# Patient Record
Sex: Female | Born: 1935 | Race: White | Hispanic: No | Marital: Married | Smoking: Never smoker
Health system: Southern US, Community
[De-identification: ages and names within clinical notes are randomized; demographics above are authoritative.]

## PROBLEM LIST (undated history)

## (undated) DIAGNOSIS — K589 Irritable bowel syndrome without diarrhea: Secondary | ICD-10-CM

## (undated) DIAGNOSIS — IMO0002 Reserved for concepts with insufficient information to code with codable children: Secondary | ICD-10-CM

## (undated) DIAGNOSIS — I351 Nonrheumatic aortic (valve) insufficiency: Secondary | ICD-10-CM

## (undated) DIAGNOSIS — E785 Hyperlipidemia, unspecified: Secondary | ICD-10-CM

## (undated) DIAGNOSIS — I7300 Raynaud's syndrome without gangrene: Secondary | ICD-10-CM

## (undated) DIAGNOSIS — J810 Acute pulmonary edema: Secondary | ICD-10-CM

## (undated) DIAGNOSIS — I2510 Atherosclerotic heart disease of native coronary artery without angina pectoris: Secondary | ICD-10-CM

## (undated) DIAGNOSIS — I10 Essential (primary) hypertension: Secondary | ICD-10-CM

## (undated) DIAGNOSIS — I639 Cerebral infarction, unspecified: Secondary | ICD-10-CM

## (undated) DIAGNOSIS — M1990 Unspecified osteoarthritis, unspecified site: Secondary | ICD-10-CM

## (undated) DIAGNOSIS — I6529 Occlusion and stenosis of unspecified carotid artery: Secondary | ICD-10-CM

## (undated) DIAGNOSIS — K279 Peptic ulcer, site unspecified, unspecified as acute or chronic, without hemorrhage or perforation: Secondary | ICD-10-CM

## (undated) DIAGNOSIS — F0390 Unspecified dementia without behavioral disturbance: Secondary | ICD-10-CM

## (undated) DIAGNOSIS — I4891 Unspecified atrial fibrillation: Secondary | ICD-10-CM

## (undated) HISTORY — DX: Occlusion and stenosis of unspecified carotid artery: I65.29

## (undated) HISTORY — DX: Hyperlipidemia, unspecified: E78.5

## (undated) HISTORY — DX: Unspecified atrial fibrillation: I48.91

## (undated) HISTORY — DX: Essential (primary) hypertension: I10

## (undated) HISTORY — DX: Unspecified osteoarthritis, unspecified site: M19.90

## (undated) HISTORY — DX: Atherosclerotic heart disease of native coronary artery without angina pectoris: I25.10

## (undated) HISTORY — DX: Irritable bowel syndrome without diarrhea: K58.9

## (undated) HISTORY — PX: APPENDECTOMY: SHX54

## (undated) HISTORY — DX: Reserved for concepts with insufficient information to code with codable children: IMO0002

## (undated) HISTORY — DX: Acute pulmonary edema: J81.0

## (undated) HISTORY — DX: Unspecified dementia, unspecified severity, without behavioral disturbance, psychotic disturbance, mood disturbance, and anxiety: F03.90

## (undated) HISTORY — DX: Raynaud's syndrome without gangrene: I73.00

## (undated) HISTORY — DX: Nonrheumatic aortic (valve) insufficiency: I35.1

## (undated) HISTORY — DX: Cerebral infarction, unspecified: I63.9

## (undated) HISTORY — DX: Peptic ulcer, site unspecified, unspecified as acute or chronic, without hemorrhage or perforation: K27.9

---

## 1998-12-14 ENCOUNTER — Encounter: Payer: Self-pay | Admitting: Cardiology

## 1998-12-14 ENCOUNTER — Encounter: Admission: RE | Admit: 1998-12-14 | Discharge: 1998-12-14 | Payer: Self-pay | Admitting: Cardiology

## 1999-06-14 ENCOUNTER — Emergency Department (HOSPITAL_COMMUNITY): Admission: EM | Admit: 1999-06-14 | Discharge: 1999-06-14 | Payer: Self-pay

## 1999-06-15 ENCOUNTER — Emergency Department (HOSPITAL_COMMUNITY): Admission: EM | Admit: 1999-06-15 | Discharge: 1999-06-15 | Payer: Self-pay | Admitting: Emergency Medicine

## 1999-12-17 ENCOUNTER — Encounter: Payer: Self-pay | Admitting: Internal Medicine

## 1999-12-17 ENCOUNTER — Encounter: Admission: RE | Admit: 1999-12-17 | Discharge: 1999-12-17 | Payer: Self-pay | Admitting: Internal Medicine

## 2000-02-22 ENCOUNTER — Emergency Department (HOSPITAL_COMMUNITY): Admission: EM | Admit: 2000-02-22 | Discharge: 2000-02-22 | Payer: Self-pay | Admitting: Internal Medicine

## 2000-12-17 ENCOUNTER — Encounter: Payer: Self-pay | Admitting: Internal Medicine

## 2000-12-17 ENCOUNTER — Encounter: Admission: RE | Admit: 2000-12-17 | Discharge: 2000-12-17 | Payer: Self-pay | Admitting: Internal Medicine

## 2001-12-18 ENCOUNTER — Encounter: Payer: Self-pay | Admitting: Internal Medicine

## 2001-12-18 ENCOUNTER — Encounter: Admission: RE | Admit: 2001-12-18 | Discharge: 2001-12-18 | Payer: Self-pay | Admitting: Internal Medicine

## 2002-12-20 ENCOUNTER — Encounter: Admission: RE | Admit: 2002-12-20 | Discharge: 2002-12-20 | Payer: Self-pay | Admitting: Internal Medicine

## 2003-03-09 ENCOUNTER — Ambulatory Visit (HOSPITAL_COMMUNITY): Admission: RE | Admit: 2003-03-09 | Discharge: 2003-03-09 | Payer: Self-pay | Admitting: Internal Medicine

## 2003-12-28 ENCOUNTER — Encounter: Admission: RE | Admit: 2003-12-28 | Discharge: 2003-12-28 | Payer: Self-pay | Admitting: Internal Medicine

## 2005-03-12 ENCOUNTER — Encounter: Admission: RE | Admit: 2005-03-12 | Discharge: 2005-03-12 | Payer: Self-pay | Admitting: Internal Medicine

## 2006-03-14 ENCOUNTER — Encounter: Admission: RE | Admit: 2006-03-14 | Discharge: 2006-03-14 | Payer: Self-pay | Admitting: Internal Medicine

## 2007-03-16 ENCOUNTER — Encounter: Admission: RE | Admit: 2007-03-16 | Discharge: 2007-03-16 | Payer: Self-pay | Admitting: Internal Medicine

## 2007-05-12 ENCOUNTER — Ambulatory Visit: Payer: Self-pay | Admitting: Cardiology

## 2007-05-12 ENCOUNTER — Inpatient Hospital Stay (HOSPITAL_COMMUNITY): Admission: EM | Admit: 2007-05-12 | Discharge: 2007-05-16 | Payer: Self-pay | Admitting: Emergency Medicine

## 2007-05-15 ENCOUNTER — Encounter (INDEPENDENT_AMBULATORY_CARE_PROVIDER_SITE_OTHER): Payer: Self-pay | Admitting: Internal Medicine

## 2007-05-20 ENCOUNTER — Ambulatory Visit: Payer: Self-pay | Admitting: Internal Medicine

## 2007-06-10 ENCOUNTER — Ambulatory Visit: Payer: Self-pay | Admitting: Emergency Medicine

## 2007-06-10 DIAGNOSIS — R0602 Shortness of breath: Secondary | ICD-10-CM

## 2007-06-10 DIAGNOSIS — J309 Allergic rhinitis, unspecified: Secondary | ICD-10-CM | POA: Insufficient documentation

## 2007-06-10 DIAGNOSIS — I10 Essential (primary) hypertension: Secondary | ICD-10-CM | POA: Insufficient documentation

## 2007-06-10 DIAGNOSIS — J438 Other emphysema: Secondary | ICD-10-CM | POA: Insufficient documentation

## 2007-06-10 DIAGNOSIS — R05 Cough: Secondary | ICD-10-CM

## 2007-07-22 ENCOUNTER — Ambulatory Visit: Payer: Self-pay | Admitting: Emergency Medicine

## 2008-02-29 DIAGNOSIS — J810 Acute pulmonary edema: Secondary | ICD-10-CM

## 2008-02-29 DIAGNOSIS — I4891 Unspecified atrial fibrillation: Secondary | ICD-10-CM

## 2008-02-29 DIAGNOSIS — I351 Nonrheumatic aortic (valve) insufficiency: Secondary | ICD-10-CM

## 2008-02-29 HISTORY — DX: Acute pulmonary edema: J81.0

## 2008-02-29 HISTORY — DX: Nonrheumatic aortic (valve) insufficiency: I35.1

## 2008-02-29 HISTORY — DX: Unspecified atrial fibrillation: I48.91

## 2008-03-05 ENCOUNTER — Ambulatory Visit: Payer: Self-pay | Admitting: Pulmonary Disease

## 2008-03-05 ENCOUNTER — Inpatient Hospital Stay (HOSPITAL_COMMUNITY): Admission: EM | Admit: 2008-03-05 | Discharge: 2008-03-12 | Payer: Self-pay | Admitting: Emergency Medicine

## 2008-03-05 ENCOUNTER — Ambulatory Visit: Payer: Self-pay | Admitting: Cardiology

## 2008-03-06 ENCOUNTER — Encounter: Payer: Self-pay | Admitting: Cardiology

## 2008-03-06 HISTORY — PX: TRANSTHORACIC ECHOCARDIOGRAM: SHX275

## 2008-03-11 HISTORY — PX: CARDIAC CATHETERIZATION: SHX172

## 2008-04-01 ENCOUNTER — Encounter: Admission: RE | Admit: 2008-04-01 | Discharge: 2008-04-01 | Payer: Self-pay | Admitting: Internal Medicine

## 2008-10-04 ENCOUNTER — Encounter: Admission: RE | Admit: 2008-10-04 | Discharge: 2008-10-04 | Payer: Self-pay | Admitting: Cardiology

## 2008-10-07 ENCOUNTER — Ambulatory Visit (HOSPITAL_COMMUNITY): Admission: RE | Admit: 2008-10-07 | Discharge: 2008-10-07 | Payer: Self-pay | Admitting: Cardiology

## 2008-12-28 ENCOUNTER — Ambulatory Visit: Payer: Self-pay | Admitting: Emergency Medicine

## 2009-02-02 ENCOUNTER — Emergency Department (HOSPITAL_COMMUNITY): Admission: EM | Admit: 2009-02-02 | Discharge: 2009-02-03 | Payer: Self-pay | Admitting: Emergency Medicine

## 2009-04-03 ENCOUNTER — Encounter: Admission: RE | Admit: 2009-04-03 | Discharge: 2009-04-03 | Payer: Self-pay | Admitting: Internal Medicine

## 2009-09-25 ENCOUNTER — Ambulatory Visit: Payer: Self-pay | Admitting: Cardiology

## 2010-03-28 ENCOUNTER — Ambulatory Visit: Payer: Self-pay | Admitting: Cardiology

## 2010-04-02 ENCOUNTER — Ambulatory Visit (INDEPENDENT_AMBULATORY_CARE_PROVIDER_SITE_OTHER): Payer: Medicare Other | Admitting: Cardiology

## 2010-04-02 DIAGNOSIS — I359 Nonrheumatic aortic valve disorder, unspecified: Secondary | ICD-10-CM

## 2010-04-02 DIAGNOSIS — I4891 Unspecified atrial fibrillation: Secondary | ICD-10-CM

## 2010-04-03 ENCOUNTER — Ambulatory Visit: Payer: Self-pay | Admitting: Cardiology

## 2010-05-10 ENCOUNTER — Other Ambulatory Visit: Payer: Self-pay | Admitting: Internal Medicine

## 2010-05-10 DIAGNOSIS — Z1231 Encounter for screening mammogram for malignant neoplasm of breast: Secondary | ICD-10-CM

## 2010-05-15 ENCOUNTER — Other Ambulatory Visit: Payer: Self-pay | Admitting: Cardiology

## 2010-05-15 DIAGNOSIS — I4891 Unspecified atrial fibrillation: Secondary | ICD-10-CM

## 2010-05-15 LAB — BRAIN NATRIURETIC PEPTIDE
Pro B Natriuretic peptide (BNP): 1340 pg/mL — ABNORMAL HIGH (ref 0.0–100.0)
Pro B Natriuretic peptide (BNP): 1655 pg/mL — ABNORMAL HIGH (ref 0.0–100.0)
Pro B Natriuretic peptide (BNP): 1883 pg/mL — ABNORMAL HIGH (ref 0.0–100.0)
Pro B Natriuretic peptide (BNP): 2582 pg/mL — ABNORMAL HIGH (ref 0.0–100.0)

## 2010-05-15 LAB — GLUCOSE, CAPILLARY
Glucose-Capillary: 106 mg/dL — ABNORMAL HIGH (ref 70–99)
Glucose-Capillary: 128 mg/dL — ABNORMAL HIGH (ref 70–99)
Glucose-Capillary: 137 mg/dL — ABNORMAL HIGH (ref 70–99)
Glucose-Capillary: 160 mg/dL — ABNORMAL HIGH (ref 70–99)
Glucose-Capillary: 93 mg/dL (ref 70–99)
Glucose-Capillary: 98 mg/dL (ref 70–99)
Glucose-Capillary: 99 mg/dL (ref 70–99)

## 2010-05-15 LAB — CBC
HCT: 26.6 % — ABNORMAL LOW (ref 36.0–46.0)
HCT: 26.7 % — ABNORMAL LOW (ref 36.0–46.0)
HCT: 30.4 % — ABNORMAL LOW (ref 36.0–46.0)
HCT: 32.2 % — ABNORMAL LOW (ref 36.0–46.0)
HCT: 32.3 % — ABNORMAL LOW (ref 36.0–46.0)
Hemoglobin: 10.6 g/dL — ABNORMAL LOW (ref 12.0–15.0)
Hemoglobin: 11.3 g/dL — ABNORMAL LOW (ref 12.0–15.0)
Hemoglobin: 11.7 g/dL — ABNORMAL LOW (ref 12.0–15.0)
Hemoglobin: 9.3 g/dL — ABNORMAL LOW (ref 12.0–15.0)
MCHC: 34.8 g/dL (ref 30.0–36.0)
MCHC: 34.9 g/dL (ref 30.0–36.0)
MCV: 86.9 fL (ref 78.0–100.0)
MCV: 87.2 fL (ref 78.0–100.0)
Platelets: 226 10*3/uL (ref 150–400)
Platelets: 276 10*3/uL (ref 150–400)
Platelets: 305 10*3/uL (ref 150–400)
RBC: 3.06 MIL/uL — ABNORMAL LOW (ref 3.87–5.11)
RBC: 3.51 MIL/uL — ABNORMAL LOW (ref 3.87–5.11)
RBC: 3.69 MIL/uL — ABNORMAL LOW (ref 3.87–5.11)
RDW: 13.6 % (ref 11.5–15.5)
RDW: 13.8 % (ref 11.5–15.5)
RDW: 13.9 % (ref 11.5–15.5)
RDW: 14 % (ref 11.5–15.5)
RDW: 14.4 % (ref 11.5–15.5)
WBC: 12.2 10*3/uL — ABNORMAL HIGH (ref 4.0–10.5)
WBC: 12.8 10*3/uL — ABNORMAL HIGH (ref 4.0–10.5)

## 2010-05-15 LAB — APTT: aPTT: 24 seconds (ref 24–37)

## 2010-05-15 LAB — BLOOD GAS, ARTERIAL
Acid-base deficit: 2.3 mmol/L — ABNORMAL HIGH (ref 0.0–2.0)
Bicarbonate: 22.5 mEq/L (ref 20.0–24.0)
Bicarbonate: 24.7 mEq/L — ABNORMAL HIGH (ref 20.0–24.0)
FIO2: 0.65 %
MECHVT: 450 mL
MECHVT: 500 mL
O2 Saturation: 96.6 %
O2 Saturation: 99.4 %
PEEP: 5 cmH2O
PEEP: 8 cmH2O
Patient temperature: 98.6
Patient temperature: 98.6
RATE: 15 resp/min
TCO2: 23.7 mmol/L (ref 0–100)
TCO2: 25.8 mmol/L (ref 0–100)
pCO2 arterial: 42 mmHg (ref 35.0–45.0)
pH, Arterial: 7.347 — ABNORMAL LOW (ref 7.350–7.400)
pO2, Arterial: 156 mmHg — ABNORMAL HIGH (ref 80.0–100.0)

## 2010-05-15 LAB — BASIC METABOLIC PANEL
BUN: 17 mg/dL (ref 6–23)
BUN: 8 mg/dL (ref 6–23)
BUN: 9 mg/dL (ref 6–23)
CO2: 27 mEq/L (ref 19–32)
CO2: 30 mEq/L (ref 19–32)
CO2: 31 mEq/L (ref 19–32)
Chloride: 108 mEq/L (ref 96–112)
Chloride: 93 mEq/L — ABNORMAL LOW (ref 96–112)
Chloride: 98 mEq/L (ref 96–112)
Chloride: 99 mEq/L (ref 96–112)
Creatinine, Ser: 0.49 mg/dL (ref 0.4–1.2)
GFR calc Af Amer: >60 mL/min (ref >60)
GFR calc Af Amer: >60 mL/min (ref >60)
GFR calc Af Amer: >60 mL/min (ref >60)
GFR calc Af Amer: >60 mL/min (ref >60)
GFR calc non Af Amer: >60 mL/min (ref >60)
GFR calc non Af Amer: >60 mL/min (ref >60)
GFR calc non Af Amer: >60 mL/min (ref >60)
Glucose, Bld: 102 mg/dL — ABNORMAL HIGH (ref 70–99)
Glucose, Bld: 103 mg/dL — ABNORMAL HIGH (ref 70–99)
Glucose, Bld: 108 mg/dL — ABNORMAL HIGH (ref 70–99)
Glucose, Bld: 115 mg/dL — ABNORMAL HIGH (ref 70–99)
Glucose, Bld: 92 mg/dL (ref 70–99)
Potassium: 3.1 mEq/L — ABNORMAL LOW (ref 3.5–5.1)
Potassium: 3.3 mEq/L — ABNORMAL LOW (ref 3.5–5.1)
Potassium: 3.4 mEq/L — ABNORMAL LOW (ref 3.5–5.1)
Potassium: 3.5 mEq/L (ref 3.5–5.1)
Potassium: 4.6 mEq/L (ref 3.5–5.1)
Potassium: 5.4 mEq/L — ABNORMAL HIGH (ref 3.5–5.1)
Sodium: 134 mEq/L — ABNORMAL LOW (ref 135–145)
Sodium: 135 mEq/L (ref 135–145)
Sodium: 137 mEq/L (ref 135–145)
Sodium: 137 mEq/L (ref 135–145)
Sodium: 138 mEq/L (ref 135–145)

## 2010-05-15 LAB — URINE CULTURE
Colony Count: 7000
Special Requests: NEGATIVE

## 2010-05-15 LAB — COMPREHENSIVE METABOLIC PANEL
ALT: 33 U/L (ref 0–35)
ALT: 60 U/L — ABNORMAL HIGH (ref 0–35)
AST: 86 U/L — ABNORMAL HIGH (ref 0–37)
Albumin: 2.9 g/dL — ABNORMAL LOW (ref 3.5–5.2)
Albumin: 3.3 g/dL — ABNORMAL LOW (ref 3.5–5.2)
Alkaline Phosphatase: 75 U/L (ref 39–117)
Alkaline Phosphatase: 87 U/L (ref 39–117)
CO2: 16 mEq/L — ABNORMAL LOW (ref 19–32)
Calcium: 8.3 mg/dL — ABNORMAL LOW (ref 8.4–10.5)
Calcium: 8.5 mg/dL (ref 8.4–10.5)
GFR calc Af Amer: >60 mL/min (ref >60)
Glucose, Bld: 112 mg/dL — ABNORMAL HIGH (ref 70–99)
Potassium: 3.7 mEq/L (ref 3.5–5.1)
Sodium: 143 mEq/L (ref 135–145)
Total Protein: 5.7 g/dL — ABNORMAL LOW (ref 6.0–8.3)
Total Protein: 6.4 g/dL (ref 6.0–8.3)

## 2010-05-15 LAB — URINALYSIS, ROUTINE W REFLEX MICROSCOPIC
Ketones, ur: NEGATIVE mg/dL
Nitrite: NEGATIVE
Protein, ur: NEGATIVE mg/dL
Urobilinogen, UA: 0.2 mg/dL (ref 0.0–1.0)

## 2010-05-15 LAB — POCT I-STAT 3, ART BLOOD GAS (G3+)
Bicarbonate: 16.1 mEq/L — ABNORMAL LOW (ref 20.0–24.0)
TCO2: 18 mmol/L (ref 0–100)
pCO2 arterial: 48.8 mmHg — ABNORMAL HIGH (ref 35.0–45.0)
pH, Arterial: 7.123 — CL (ref 7.350–7.400)
pO2, Arterial: 89 mmHg (ref 80.0–100.0)

## 2010-05-15 LAB — CK TOTAL AND CKMB (NOT AT ARMC)
CK, MB: 2.1 ng/mL (ref 0.3–4.0)
Relative Index: INVALID (ref 0.0–2.5)

## 2010-05-15 LAB — CULTURE, BLOOD (ROUTINE X 2)

## 2010-05-15 LAB — CULTURE, RESPIRATORY W GRAM STAIN

## 2010-05-15 LAB — CARDIAC PANEL(CRET KIN+CKTOT+MB+TROPI)
CK, MB: 5.1 ng/mL — ABNORMAL HIGH (ref 0.3–4.0)
CK, MB: 7.5 ng/mL — ABNORMAL HIGH (ref 0.3–4.0)
Relative Index: 4 — ABNORMAL HIGH (ref 0.0–2.5)
Total CK: 101 U/L (ref 7–177)
Total CK: 127 U/L (ref 7–177)
Troponin I: 0.38 ng/mL — ABNORMAL HIGH (ref 0.00–0.06)

## 2010-05-15 LAB — DIFFERENTIAL
Basophils Relative: 0 % (ref 0–1)
Eosinophils Absolute: 0.1 10*3/uL (ref 0.0–0.7)
Eosinophils Relative: 1 % (ref 0–5)
Lymphs Abs: 7.4 10*3/uL — ABNORMAL HIGH (ref 0.7–4.0)
Monocytes Absolute: 0.4 10*3/uL (ref 0.1–1.0)
Neutro Abs: 5.3 10*3/uL (ref 1.7–7.7)

## 2010-05-15 LAB — POCT CARDIAC MARKERS
Myoglobin, poc: 196 ng/mL (ref 12–200)
Troponin i, poc: <0.05 ng/mL (ref 0.00–0.09)

## 2010-05-15 LAB — URINE MICROSCOPIC-ADD ON

## 2010-05-15 LAB — TSH: TSH: 5.224 u[IU]/mL — ABNORMAL HIGH (ref 0.350–4.500)

## 2010-05-15 MED ORDER — AMIODARONE HCL 200 MG PO TABS
200.0000 mg | ORAL_TABLET | Freq: Every day | ORAL | Status: DC
Start: 2010-05-15 — End: 2010-05-17

## 2010-05-15 NOTE — Telephone Encounter (Signed)
Pt notified that prescription was refilled for amiodarone.

## 2010-05-15 NOTE — Telephone Encounter (Signed)
PT OUT OF AMIODARONE-USES [facility] [place] [phone]

## 2010-05-17 ENCOUNTER — Other Ambulatory Visit: Payer: Self-pay | Admitting: Cardiology

## 2010-05-17 DIAGNOSIS — I4891 Unspecified atrial fibrillation: Secondary | ICD-10-CM

## 2010-05-18 ENCOUNTER — Other Ambulatory Visit: Payer: Self-pay | Admitting: Cardiology

## 2010-05-18 DIAGNOSIS — I4891 Unspecified atrial fibrillation: Secondary | ICD-10-CM

## 2010-05-18 NOTE — Telephone Encounter (Signed)
escribe medication per fax request  

## 2010-05-19 ENCOUNTER — Other Ambulatory Visit: Payer: Self-pay | Admitting: Cardiology

## 2010-05-21 ENCOUNTER — Ambulatory Visit
Admission: RE | Admit: 2010-05-21 | Discharge: 2010-05-21 | Disposition: A | Discharge status: Home / Self Care | Payer: Medicare Other | Source: Ambulatory Visit | Attending: Internal Medicine | Admitting: Internal Medicine

## 2010-05-21 DIAGNOSIS — Z1231 Encounter for screening mammogram for malignant neoplasm of breast: Secondary | ICD-10-CM

## 2010-05-21 NOTE — Telephone Encounter (Signed)
duplicate

## 2010-05-22 ENCOUNTER — Other Ambulatory Visit: Payer: Self-pay | Admitting: Internal Medicine

## 2010-05-22 DIAGNOSIS — R928 Other abnormal and inconclusive findings on diagnostic imaging of breast: Secondary | ICD-10-CM

## 2010-05-24 ENCOUNTER — Ambulatory Visit
Admission: RE | Admit: 2010-05-24 | Discharge: 2010-05-24 | Disposition: A | Discharge status: Home / Self Care | Payer: Medicare Other | Source: Ambulatory Visit | Attending: Internal Medicine | Admitting: Internal Medicine

## 2010-05-24 DIAGNOSIS — R928 Other abnormal and inconclusive findings on diagnostic imaging of breast: Secondary | ICD-10-CM

## 2010-06-12 NOTE — Consult Note (Signed)
[name], [name] NO.:  [id number]   MEDICAL RECORD NO.:  [id number]          PATIENT TYPE:  INP   LOCATION:  3743                         FACILITY:  [facility]   PHYSICIAN:  Peter M. Swaziland, M.D.  DATE OF BIRTH:  Jan 12, 1921   DATE OF CONSULTATION:  05/14/2007  DATE OF DISCHARGE:                                 CONSULTATION   HISTORY OF PRESENT ILLNESS:  Ms. Huisman is a very pleasant 77 year old  white female who is admitted on May 12, 2007 with symptoms of nausea  and vomiting that were persistent.  She has a history of early dementia,  COPD and osteoarthritis.  She has been on chronic therapy with Aleve.  She does have a history of hyperlipidemia and family reports her blood  pressure has been recently elevated.  She currently is on no  antihypertensive therapy.  The patient was subsequently evaluated by Dr.  Lina Sar.  She underwent upper endoscopy today which did show  significant gastritis consistent with her nonsteroidal anti-inflammatory  drugs.  Post procedure she developed a narrow complex tachycardia with  rates up to 170. Review of the strips were consistent with atrial  flutter. These episodes were nonsustained and the patient denies any  significant symptoms.  She does note that she has had occasional  sensation of fluttering in her chest in the past, but has had no  sustained episodes. She otherwise denies any significant history of  cardiac disease with the exception that she has had a cardiac murmur for  the past 56 years.   PAST MEDICAL HISTORY:  1. Is significant for history of peptic ulcer disease and duodenal      ulcer.  2. History of arthritis.  3. History of previous appendectomy.  4. The patient is postmenopausal.  5. She has a history of lactose intolerance.  6. She has a history of hyperlipidemia.  7. She has early dementia.  8. History of depression, anxiety.  9. History of osteoporosis.  10.Previous compression fractures at  T9.  11.She had vitamin D deficiency.  12.Prior history of irritable bowel syndrome.   ALLERGIES:  SHE IS INTOLERANT TO ASPIRIN WHICH CAUSES STOMACH PAIN.  SHE  IS ALSO INTOLERANT TO ZOMETA.   MEDICATIONS ON ADMISSION:  1. Include Crestor 10 mg per day.  2. Vitamin E 400 units daily.  3. Aleve 220 mg 1-2 tablets daily.  4. Unisom p.r.n.  5. Exelon patch 9.5 mg daily.  6. Lexapro 5 mg daily.  7. Forteo 20 mcg shot once a day.   SOCIAL HISTORY:  Patient is married.  She has one daughter.  She is  retired, previously working for [employer].  She denies  history of tobacco or alcohol use.   FAMILY HISTORY:  Father died of myocardial infarction age 61.  Mother  died at age 39 with uterine cancer and a stroke.  Her  daughter is  healthy.  Her review of systems is otherwise unremarkable.   PHYSICAL EXAM:  The patient is an elderly white female in no apparent  distress. Her blood  pressure during this hospitalization have ranged  anywhere from 155-176 systolic with diastolics of 68-82.  Blood pressure  this evening was 190/110.  She currently is in sinus rhythm with  frequent PACs. Respirations are normal.  Oxygen saturations are 92% on  room air.  Her HEENT exam is unremarkable.  Oropharynx is clear.  NECK: Neck is supple without JVD, adenopathy or bruits.  LUNGS:  Were clear to auscultation percussion.  CARDIAC:  Exam reveals a regular rate and rhythm with a very soft 1/6  systolic murmur left sternal border.  There is no S3 year rub.  ABDOMEN:  Soft, nontender without hepatosplenomegaly, masses or bruits.  EXTREMITIES:  Without edema.  She has no focal neurologic symptoms.   LABORATORY DATA:  Iron level is 34 with TIBC of 248 and percent  saturation 14%; folate level was normal.  Ferritin is 64.  B12 is 499,  sodium 142, potassium 3.5, chloride 105, CO2 29, glucose of 95, BUN 7,  creatinine 0.58.  Liver function studies were normal. White count  11,600, hemoglobin 11.2,  hematocrit 32.4, platelets 280,000.  Urine  culture was insignificant growth. Magnesium is 2.1.   IMPRESSION:  1. Nonsustained episodes of supraventricular tachycardia most      consistent with atrial flutter.  The patient is mildly symptomatic.  2. Hypertension.  3. Gastritis secondary nonsteroidal anti-inflammatory drugs.  4. Anemia with heme-positive stools.  5. Chronic obstructive pulmonary disease.  6. History of compression fractures.  7. Early dementia.   PLAN:  Will complete cardiac workup by checking an echocardiogram and  will also obtain a TSH.  Will start her on Cardizem for blood pressure  and rhythm control.  I would avoid beta blockers due to her chronic  obstructive pulmonary disease and she is currently not a good candidate  for anticoagulation given her extensive gastric irritation in anemia.  We will follow up with you.           ______________________________  Peter M. [place], M.D.     PMJ/MEDQ  D:  05/14/2007  T:  05/14/2007  Job:  [id number]   cc:   Jeannett Senior A. Evlyn Kanner, M.D.  Redge Gainer. Perini, M.D.  Hedwig Morton. Juanda Chance, MD

## 2010-06-12 NOTE — Cardiovascular Report (Signed)
NAMEMARYKATHRYN, CARBONI NO.:  [id number]   MEDICAL RECORD NO.:  [id number]          PATIENT TYPE:  INP   LOCATION:  2009                         FACILITY:  [facility]   PHYSICIAN:  Colleen Can. Deborah Chalk, M.D.DATE OF BIRTH:  07-May-1920   DATE OF PROCEDURE:  03/11/2008  DATE OF DISCHARGE:                            CARDIAC CATHETERIZATION   PROCEDURE:  Left heart catheterization with selective coronary  angiography and left ventricular angiography.   TYPE AND SITE OF ENTRY:  Percutaneous right femoral artery with  AngioSeal.   CATHETERS:  6-French 4-curved Judkins right and left coronary catheter,  6-French pigtail ventriculographic catheter.   CONTRAST MATERIAL:  Omnipaque.   MEDICATION GIVEN DURING THE PROCEDURE:  Intracoronary nitroglycerin, 100  mcg.   COMMENTS:  The patient tolerated the procedure well.   HEMODYNAMIC DATA:  Aortic pressure was 147/59, LV was 132/1-5.  There  was no aortic valve gradient noted on pullback.   ANGIOGRAPHIC DATA:  The left ventricular angiogram was performed in RAO  position.  Overall, cardiac size and silhouette were normal.  There was  mild global decrease in ejection fraction of approximately 45%.  There  was no mitral regurgitation noted.  The left ventricular filling  pressures were only 4.  1. Right coronary artery was nondominant.  There was a 60% ostial      stenosis that did not appear to leave with attempted infusion of      intercuspal nitroglycerin.  I think, this moderate stenosis is      probably real, but it is a small nondominant vessel.  2. Left main coronary artery is normal.  3. Left anterior descending had a 30% proximal narrowing.  There was a      60-70% stenosis before the second diagonal end, 60% narrowing after      the diagonal.  This was in midportion of the vessel.  There were      irregularities proximally, but overall excellent flow.  4. Left circumflex was a large dominant vessel with a posterior    descending vessel and a large obtuse marginal to the posterolateral      wall and a more proximal marginal.  There were irregularities in      the left circumflex with no significant focal stenosis.   OVERALL IMPRESSION:  Moderate 2-vessel coronary atherosclerosis with a  60% ostial stenosis in the right coronary artery and a 60-70% stenosis  in the mid left anterior descending.   PLAN:  I think medical management is most appropriate with the course of  action.  We will try to control her arrhythmia with amiodarone.  We will  be giving IV fluids post cath and it may be that her BMP is in fact  artificially or chronically elevated with the low left ventricular  filling pressures noted.  It is felt that medical management is most  appropriate at this point in time.      Colleen Can. Deborah Chalk, M.D.  Electronically Signed     SNT/MEDQ  D:  03/11/2008  T:  03/11/2008  Job:  [id number]

## 2010-06-12 NOTE — H&P (Signed)
NAMEMARICRUZ, [name] NO.:  [id number]   MEDICAL RECORD NO.:  [id number]          PATIENT TYPE:  EMS   LOCATION:  MAJO                         FACILITY:  [facility]   PHYSICIAN:  Rollene Rotunda, MD, FACCDATE OF BIRTH:  09-08-1920   DATE OF ADMISSION:  03/05/2008  DATE OF DISCHARGE:                              HISTORY & PHYSICAL   PRIMARY CARE PHYSICIAN:  Mark A. Perini, M.D.   CARDIOLOGIST:  Colleen Can. Deborah Chalk, M.D.   REASON FOR PRESENTATION:  Evaluate patient with shortness of breath.   HISTORY OF PRESENT ILLNESS:  The patient is a 77 year old white female  with history of nonsustained supraventricular tachycardia that has not  been a significant problem in the past.  She has had an echocardiogram  in the past demonstrating moderate mitral regurgitation, a well-  preserved ejection fraction and some mild left ventricular hypertrophy.  She does have some COPD as well.  She was seen in the past by Dr.  Ronnald Nian group when she was hospitalized last April with nausea and  vomiting, felt to be medication related.  She had paroxysmal  supraventricular tachycardia that time.  Otherwise, she apparently does  pretty well.  Her family says that she does all the cooking.  She gets  around in her house.  She does not complain of palpitations.  She has  had no presyncope or syncope.  She has had no chest discomfort, neck or  arm discomfort.  She has had no recent shortness of breath.  She does  not have any PND or orthopnea.   Tonight she went to bed.  Her husband went into the bathroom and was  there for awhile.  When he came back, his wife had been awakened and was  struggling to breathe.  EMS was called and she was found to have 85%  saturation on 4 liters.  Her heart rate was 180.  She was transferred to  the emergency room.  She was treated with 6 of adenosine without change  in her rhythm.  Because she was combative and in respiratory distress,  she was sedated and  intubated.  She was also given 150 mg of amiodarone.  She subsequently had  conversion to sinus tachycardia.  She had  initially been labeled as a possible code  STEMI.  However, her EKG in sinus rhythm demonstrated some probable  anterior lead reversal with early transition in lead V2.  However, there  was no definitive ST-segment elevation.  She had some rate related mild  ST elevation upsloping that did not meet criteria for acute MI.  Again,  she had not been complaining of chest pain, though there might have been  mention of some arm discomfort that she told EMS on transport.  Her  chest x-ray is consistent with COPD and probable acute pulmonary edema.  There is diffuse airspace disease.  The patient had not been having any  fevers or chills.  She had not been having any weight gain.  She had not  been having any nausea, vomiting, diaphoresis.  She has not been  having  any edema.   PAST MEDICAL HISTORY:  1. COPD (treated by Dr. Ortencia Kick).  2. Osteoarthritis.  3. Remote peptic ulcer disease.  4. Gastroesophageal reflux disease.  5. Hyperlipidemia.  6. Depression/anxiety.  7. T9 compression fracture.  8. Allergic rhinitis.  9. Vitamin D deficiency.  10.Remote shingles.  11.Irritable bowel syndrome.  12.Hypertension.  13.Moderate mitral regurgitation.  14.Paroxysmal supraventricular tachycardia.   PAST SURGICAL HISTORY:  Appendectomy.   ALLERGIES:  1. ZOMETA.  2. ASPIRIN caused nausea.  3. MAXZIDE caused dizziness.   MEDICATIONS:  (The patient's family is not clear on this.  They did not  bring a list).  1. Crestor 20 mg daily.  2. Lexapro apparently 5 mg daily.  3. Ativan p.r.n.  4. Prilosec 20 mg daily.  5. Carafate 1 gm b.i.d.   SOCIAL HISTORY:  The patient is married.  She has one daughter.  She has  a granddaughter who works as a Engineer, civil (consulting) on the [employer].  She has never smoked  cigarettes.  She does not drink alcohol.   FAMILY HISTORY:  Noncontributory for early coronary  artery disease,  though her father died at 26 of myocardial infarction.   REVIEW OF SYSTEMS:  Unobtainable from the patient.  The family reports  bilateral shoulder pain related to arthritis, but no other apparent  review of systems was positive.   PHYSICAL EXAMINATION:  GENERAL:  The patient is intubated and sedated.  VITAL SIGNS:  Blood pressure is currently 98/70, heart rate 110 and  regular.  HEENT:  Eyes are unremarkable.  Pupils are equal, round and react to  light.  Fundi not visualized.  Oral mucosa, orally intubated  NECK:  Unable to assess jugular veins.  The patient is currently in  Trendelenburg, carotid upstroke brisk and symmetrical.  No bruits or  thyromegaly.  LYMPHATICS:  No cervical, axillary, inguinal adenopathy.  LUNGS:  Decreased breath sounds with diffuse crackles throughout, no  wheezing.  CHEST:  Unremarkable.  HEART:  PMI not displaced or sustained, S1-S2 within normal limits, no  S3-S4, no clicks, rubs, murmurs.  ABDOMEN:  Flat, positive bowel sounds, no hepatomegaly, no splenomegaly,  no midline pulsatile mass, no bruit, unable to assess rebound or  guarding.  SKIN:  No rashes, no nodules.  EXTREMITIES:  2+ pulses throughout, no edema, no cyanosis, no clubbing.  NEURO:  The patient does move all extremities when sedation wears off.  Unable to assess other.   DIAGNOSTICS:  1. EKG as described above.  2. Chest x-ray as above.   LABORATORY DATA:  Pending.   ASSESSMENT/PLAN:  1. Respiratory failure.  Patient is now intubated and sedated.  She      appears to have pulmonary edema on chest x-ray.  I think this is      related primarily to supraventricular tachycardia exacerbating      probably diastolic dysfunction and mitral regurgitation.  She does      not have diagnostic criteria for acute ST-segment elevation MI.      This was initially called a code STEMI, but this was stopped.  I      am going to have them recheck the leads and repeat the EKG.   We      will cycle cardiac enzymes.  For now, she is going to be treated      with Lasix and ventilator support.  I have contacted Critical Care      Medicine.  Because of her current blood pressure, I will  hold beta      blockers and nitrates.  She will be given aspirin.  I will start      heparin as she has no contraindication.  She will need another      echocardiogram during this admission.  We will check routine labs      including TSH.  2. Supraventricular tachycardia as above.  She was given a bolus of      amiodarone.  However, I will hold off on drip for now.  3. Chronic obstructive pulmonary disease per Critical Care Medicine.  4. Dyslipidemia.  We can continue the Crestor via tube.  5. Depression/anxiety.  We will continue the Lexapro once the family      brings the medication list so we know the doses.      Rollene Rotunda, MD, [facility]  Electronically Signed     JH/MEDQ  D:  03/05/2008  T:  03/05/2008  Job:  [id number]

## 2010-06-12 NOTE — H&P (Signed)
[name], [name] NO.:  [id number]   MEDICAL RECORD NO.:  [id number]          PATIENT TYPE:  INP   LOCATION:  5152                         FACILITY:  [facility]   PHYSICIAN:  [name], M.D.   DATE OF BIRTH:  [birth year]   DATE OF ADMISSION:  05/12/2007  DATE OF DISCHARGE:                              HISTORY & PHYSICAL   CHIEF COMPLAINT:  Nausea, vomiting.   HISTORY OF PRESENT ILLNESS:  [name] is a pleasant 77 year old female with  a past medical history as listed below.  She was seen in my office in  January 2009 at which time she was diagnosed with a early dementia.  She  was placed on an Exelon patch.  She initially had some slight nausea,  but this improved and she actually was able to titrate it to full-dose  and has been tolerating this for the last few months.  She was seen on  April 28, 2007, my office with a COPD exacerbation.  At that time, she  was having cough and some shortness of breath and she had significant  wheezing on exam and reduced peak flows.  She was treated with Advair  twice daily as well as p.r.n. albuterol and Singulair was added to her  regimen.  However, since that visit, she has had significant issues with  nausea and vomiting.  She states that more days and not she had  significant nausea and vomiting, sometimes when she stands up she feels  nauseated, at other times she may be just sitting and feel this way.  She has had significant vomiting.  She denies any significant diarrhea  or multiple stools, but she does have two loose stools each day, but  this is consistent with a past history of possible irritable bowel  syndrome.  She had no blood from above or below and she specifically  denies any abdominal pain.  She does feel hot at times, but there have  been no definite fevers and no vaginal or genitourinary symptoms.  She  has not been eating much solid foods the last several days and only been  eating broth-type foods and  liquid intake.  She has still been doing the  Exelon patch.  She is admitted for further evaluation of her nausea and  vomiting.   PAST MEDICAL HISTORY:  1. History of peptic ulcer disease with a duodenal ulcer remotely.  2. Nodal osteoarthritis.  3. Appendectomy 1955.  4. G1, P1 parity status with a normal vaginal delivery.  5. Postmenopausal since age 9, and she never took hormone therapy.  6. Gastroesophageal reflux disease.  7. Lactose intolerance.  8. Hyperlipidemia with a dyslipidemia pattern  9. Depression and anxiety in 2002 and another episode in April 2008.  10.Osteoporosis since 2002 with an anterior wedge compression fracture      at T9.  11.Lifetime pill dysphagia.  She crushes most pills.  12.Perineal allergic rhinitis.  13.Impaired fasting glucose.  14.Vitamin D deficiency diagnosed in April 2007.  15.Shingles on her left trunk in 1997.  16.Irritable bowel syndrome.  17.Hypertension  since 2008.  18.Decreased hearing.   ALLERGIES:  ASPIRIN CAUSES STOMACH PAIN, ZOMETA CAUSED CHILLS, FEVER AND  JOINT PAIN WHICH IS BASICALLY A REACTION TO HER INFUSION OF ZOMETA THAT  SHE RECEIVED IN 2004 FOR OSTEOPOROSIS. EVISTA CAUSED NAUSEA, VOMITING  AND A DRUNKEN DIZZY FEELING AND MAXZIDE CAUSED NAUSEA AND DIZZINESS.   MEDICATIONS:  1. Crestor 1/2 of a 20 mg pill daily.  2. Vitamin E 400 units daily.  3. Aleve 220 mg once daily.  4. Unisom as needed for sleep.  5. Exelon patch 9.5 mg changed daily, rare.  6. Ativan 0.5 mg.  7. Lexapro 1/2 of a 10 mg pill daily.  8. Extra strength Tylenol as needed.  9. Forteo 20 mcg shot once a day.   SOCIAL HISTORY:  She has  been married since 54.  Her husband's name  is [name].  She has one daughter, 2 grandchildren and four great-  grandchildren.  She is retired in 1988.  She worked for [employer] for over 30 years.  She has never smoked.  No alcohol or drug  use.   FAMILY HISTORY:  Father died at age 21 of a myocardial  infarction and  cardiomegaly.  Mother died at age 28 of uterine cancer and stroke.  There is diabetes in her family and one sister died of suicide.  Her  daughter, [name], is healthy.   REVIEW OF SYSTEMS:  As per the history present illness.  She denies any  chest pains or edema.   PHYSICAL EXAMINATION:  VITAL SIGNS:  Temperature 97.1, pulse 85,  respiratory 20, blood pressure 175/74, 95% saturation on room air,  weight is 56.24 kg, height is 65 inches.  GENERAL:     She is in no acute distress.  No definite pallor or  icterus.  There is no JVD.  NECK:  Supple.  LUNGS: Clear to auscultation bilaterally except for some slight  inspiratory rales at the left base.  ABDOMEN:  Notable for some hyperactive bowel sounds but is soft and  nontender with no masses or hepatosplenomegaly.  HEART:  Regular rate and rhythm with no significant murmur or gallop.  EXTREMITIES:  There is no edema.  She moves extremities x4.  NEUROLOGICAL:  She is alert and oriented x4 and neurologically grossly  intact.   LABORATORY DATA:  Phosphorus is 3.1, magnesium 2.1, lipase normal at 15,  total bilirubin is 0.4, direct bilirubin 0.2, alk phos 86, AST 19, ALT  13, total protein 6.9, albumin 3.4, ionized calcium 1.15, hemoglobin  11.9.  Sodium 138, potassium 3.7, chloride 101, glucose 165, BUN 14,  creatinine 0.8, white count 11.6, hemoglobin 12.0, platelet count  357,000.  There are 83% segs, 13% lymphocytes and 4% monocytes.  Acute  abdominal series shows hyperaerated lungs consistent with COPD.  There  is some pleural thickening of the left apex.  No evidence of bowel  obstruction or free air.  No definite acute process in the chest or  abdomen.  There is a probable Reidel's lobe of the liver extending into  the right pelvis.  She has osteopenic bones and some mild thoracolumbar  scoliosis.   ASSESSMENT/PLAN:  An 77 year old female with nausea and vomiting unclear  etiology.  It is possibly a medication side  effect.  Her Exelon patch  could be a culprit, less likely it is the Singulair or [employer]  which have been added recently in the last few weeks.  Also, this could  be due to  Forteo, although she had tolerated this for several months  prior to developing this nausea problem.  We will admit her.  We will  stop all of her potentially  offending medications.  We will check an abdominal ultrasound.  We will  obtain a GI consult.  She does desire a full code status.  We will place  her on DVT and ulcer prophylactic type treatments.  For her elevated  white blood count, we will follow a CBC and a urinalysis and urine  culture has been ordered.      [name], M.D.  Electronically Signed     MAP/MEDQ  D:  05/12/2007  T:  05/12/2007  Job:  [id number]

## 2010-06-15 NOTE — Discharge Summary (Signed)
[name], [name] NO.:  [id number]   MEDICAL RECORD NO.:  [id number]          PATIENT TYPE:  INP   LOCATION:  3743                         FACILITY:  [facility]   PHYSICIAN:  Mark A. Perini, M.D.   DATE OF BIRTH:  08/16/1920   DATE OF ADMISSION:  05/12/2007  DATE OF DISCHARGE:  05/16/2007                               DISCHARGE SUMMARY   DISCHARGE DIAGNOSES:  1. Nausea, vomiting, and diarrhea, probably due to Exelon treatment.  2. Moderate mitral regurgitation and mild-to-moderate aortic      insufficiency and mild pulmonary hypertension.  3. Gastritis, possibly secondary to NSAIDs and bile reflux.  4. Paroxysmal atrial flutter versus paroxysmal supraventricular      tachycardia.  5. Mild dementia.  6. Anemia.  7. Hypertension.  8. Chronic obstructive pulmonary disease.  9. Osteoarthritis.  10.Leukocytosis.   PROCEDURES:  Cardiology consultation and gastroenterology consultation.  Esophagogastroduodenoscopy showing acute gastritis, antral gastritis,  and bile reflux performed on May 14, 2007.   DISCHARGE MEDICATIONS:  1. Crestor 20 mg one-half pill daily.  2. Lexapro 10 mg one-half pill daily.  3. Ativan 0.5 mg each evening as needed.  4. Tylenol 650 mg every 6 hours as needed.  5. Prilosec 20 mg daily.  6. Vitamin E 400 units daily.  7. Unisom as needed.  8. Advair 250/50 one inhalation twice daily.  9. Diltiazem SR 180 mg daily.  10.Carafate 1 gram twice daily.  11.K tabs 8 mEq daily for 5 days.   HISTORY OF PRESENT ILLNESS:  [name] is a pleasant 77 year old female who  was diagnosed with early dementia in January 2009 and placed on Exelon  patch.  She initially had some slight nausea but this improved and she  was actually able to titrate to full-dose Exelon patch and tolerated  that for the last few months.  She was seen on April 28, 2007 with a  COPD exacerbation.  She was treated with Advair, albuterol, and  Singulair.  Since that visit, she  developed significant nausea and  vomiting.  She also has had 2 loose stools daily prior to admission.  She denied any bleeding.  Her oral intake had been very poor for a few  days prior to admission and she was admitted for further evaluation.   HOSPITAL COURSE:  [name] was admitted.  Gastroenterology was consulted.  It turns out that [name] was taking some NSAIDs and she actually had an  EGD which did show some gastritis.  Therefore, the NSAIDs were  discontinued and she was placed on proton pump inhibitor therapy.  After  her EGD, she had an episode of narrow complex tachycardia with rates up  to 170 beats per minute consistent with atrial flutter.  She was placed  on Cardizem and cardiology was consulted.  Echocardiogram was performed.  This showed a sclerotic mitral valve with moderate MR, mild-to-moderate  AI, aortic insufficiency, mild tricuspid regurgitation, mild pulmonary  hypertension, and left atrial enlargement.  Her blood pressure and her  rhythm stabilized and she was discharged home on oral Cardizem therapy.  By  May 16, 2007, she was feeling better.  She was not having any  further nausea or diarrhea or palpitations and was discharged home.   DISCHARGE PHYSICAL EXAMINATION:  VITAL SIGNS:  Blood pressure 133/61,  pulse 79, respiratory rate 16, temperature 97.9, 92% saturation on room  air.  GENERAL:  She was in no acute distress.  LUNGS:  Clear.  HEART:  Regular rate and rhythm.  ABDOMEN:  Soft and nontender with normal active bowel sounds.  NEUROLOGIC:  She was ambulating independently in the room and alert and  well oriented.   LABORATORY DATA:  The patient also had an abdominal ultrasound which  showed a few gallstones but no evidence of cholecystitis.  On April 18,  white count was 13.3, hemoglobin 12.2, platelet count 301,000.  There  were 69% segs, 21% lymphocytes, 8% monocytes.  Sodium was 140, potassium  4.2, chloride 103, CO2 30, glucose 99, BUN 7, creatinine  0.59.  Potassium was 3.0 on May 15, 2007.  Total protein 6.9, albumin 3.1,  AST 17, ALT 16, alk phos 78, total bili 0.5.  Cardiac enzymes on May 13, 2007 were negative.  Lipase on May 12, 2007 was normal at 15.  TSH  was normal at 1.52 on May 14, 2007.  Iron studies on May 14, 2007  showed iron of 34 which is low, a TIBC of 248, percent saturation of 14  which is low.  B12 was normal at 499.  Serum folate level was greater  than 20 ng/mL which is normal.  Urine culture was negative.  Helicobacter biopsies from the EGD were pending at this time.   DISCHARGE INSTRUCTIONS:  [name] is to follow a heart-healthy diet.  She is  to increase her activity slowly.  She is to call if she has any  recurrent problems.  She is to followup in 1 week with Dr. Waynard Edwards.  She  is to discontinue the Exelon patch and any NSAIDs.  She had also  previously been on Forteo  for severe osteoporosis and this is on hold  currently.      Mark A. Perini, M.D.  Electronically Signed     MAP/MEDQ  D:  06/25/2007  T:  06/25/2007  Job:  [id number]

## 2010-06-15 NOTE — Discharge Summary (Signed)
NAMEKISTA, ROBB NO.:  [id number]   MEDICAL RECORD NO.:  [id number]          PATIENT TYPE:  INP   LOCATION:  2009                         FACILITY:  [facility]   PHYSICIAN:  Colleen Can. Deborah Chalk, M.D.DATE OF BIRTH:  Sep 24, 1920   DATE OF ADMISSION:  03/05/2008  DATE OF DISCHARGE:  03/12/2008                               DISCHARGE SUMMARY   DISCHARGE DIAGNOSES:  1. Atrial fibrillation/flutter with rapid ventricular response, now      maintained in sinus rhythm on amiodarone.  2. Flash pulmonary edema requiring intubation and ventilatory support.  3. Anemia.  4. Aspiration pneumonia, treated with antibiotics.  5. Hypokalemia secondary to diuresis.  6. Left ventricular dysfunction, ejection fraction is estimated to be      45% of her cardiac catheterization on March 11, 2008.  7. Coronary artery disease status post cardiac catheterization,      March 11, 2008 showing nondominant right coronary with 60%      ostial stenosis, normal left main, 30% proximal left anterior      descending with 60-70% before and after the second diagonal, a      dominant left circumflex that has irregularities.  Overall, the      patient is felt to have moderate coronary artery disease and will      need to be controlled medically.   HISTORY OF PRESENT ILLNESS:  Galya is a very pleasant 77 year old white  female who has had a past history of nonsustained supraventricular  tachycardia that had not been a significant problem for her in the past.  She has had known moderate mitral regurgitation per past 2-D  echocardiogram with well preserved LV function.  She has had some left  ventricular hypertrophy.  She has basically been in her usual state of  health and has been very active.  She had had no complaints of chest  pain or shortness of breath or palpitations.  On the night of admission,  she had gone to bed and her husband went to the bathroom and was in the  bathroom for a period  of time and when he came back he noticed that his  wife had been awakened and was struggling to breathe.  EMS was called  and she had an O2 saturation that was 85%.  Her heart rate was 180.  She  was brought to the emergency room where she was treated with 6 mg of  adenosine without change in the rhythm.  She was subsequently combative  due to the respiratory distress and she was subsequently sedated and  intubated and placed on ventilatory support.  She was given 150 mg of  amiodarone and had conversion to sinus tachycardia.  She was  subsequently transferred to the Intensive Care Unit for further  management.   Please see the history and physical per Dr. Rollene Rotunda for further  patient presentation and profile.   LABORATORY DATA:  On admission, her white count was elevated at 22,000,  hematocrit was 33, and platelets 276.  Chemistry showed a BUN of 16,  creatinine of 0.9,  glucose was 112, and potassium was 3.7.  Blood gases  showed a pH 7.42, pCO2 38, pO2 81, and bicarb was 24.  Her second  troponin was elevated at 0.53.  Her chest x-ray showed cardiomegaly with  probable interstitial mild edema superimposed on COPD.  Her EKG was  concerning for probable anterior lead reversal with early transition in  lead V2.  There was no definitive ST-segment elevation.  She did have  some mild rate related mild ST elevation that was upsloping, that did  not meet the criteria for acute MI.   HOSPITAL COURSE:  The patient was admitted.  She was intubated and  placed on ventilatory support.  She was transferred to the Intensive  Care Unit.  Amiodarone was continued.  The patient was followed by the  Critical Care as well.  She was also started on antibiotics and there  was concern for possible aspiration at the time of intubation.  She  initially did well and was extubated by March 07, 2008.  Unfortunately, her BNP began to climb and she subsequently had recurrent  atrial fibrillation with  heart rates up into the 140s despite reloading  with amiodarone.  BNP also continued to climb up to 2600.  Cardizem was  subsequently started for rate control.  She was diuresed with Lasix.  Antibiotics were continued for the pneumonia and her potassium levels  were replaced accordingly.  She had one brief episode of tachycardia by  March 10, 2008 and in light of the recurrence of arrhythmia, it was  felt that she would need to proceed on with cardiac catheterization to  rule out ischemia as the etiology for her arrhythmia.  BNP was slow to  come down but she was able to undergo cardiac catheterization on  March 11, 2008.  She had mild global decrease in ejection fraction at  45%.  There was no significant mitral regurgitation.  The results  otherwise are as noted above.  At this time, it is felt that she will  need to be managed medically.  Her coronary disease is mild-to-moderate  and that the best option now is medical management.  By March 12, 2008, she was doing well.  She had been transferred to the floor.  Activity had been advanced and tolerated without problems and she was  felt to be a satisfactory candidate for discharge.   DISCHARGE CONDITION:  Stable.   DISCHARGE DIET:  Low-salt, heart-healthy.   ACTIVITY:  To be increased as tolerated.   DISCHARGE MEDICATIONS:  1. Crestor 20 mg a day.  2. Lexapro 10 mg a day.  3. Amiodarone 200 mg taking 2 tablets 2 times a day for 1 week and      then 1 tablet 2 times a day.  4. Lasix 40 mg each morning.  5. Ecotrin 81 mg a day.  6. Protonix 40 mg a day.  7. Nitroglycerin patch 0.4 mg an hour to be applied each night.  8. K-Dur 20 mEq a day.   We will plan on seeing her back in the office in 1 week with repeat lab  work on return, certainly sooner if any problems arise in the interim.      Sharlee Blew, N.P.      Colleen Can. Deborah Chalk, M.D.  Electronically Signed    LC/MEDQ  D:  04/06/2008  T:  04/07/2008   Job:  [id number]   cc:   Loraine Leriche A. Perini, M.D.

## 2010-09-24 ENCOUNTER — Encounter: Payer: Self-pay | Admitting: Nurse Practitioner

## 2010-09-30 ENCOUNTER — Other Ambulatory Visit: Payer: Self-pay | Admitting: Cardiology

## 2010-10-02 ENCOUNTER — Ambulatory Visit: Payer: Medicare Other | Admitting: Nurse Practitioner

## 2010-10-04 ENCOUNTER — Ambulatory Visit: Payer: Medicare Other | Admitting: Nurse Practitioner

## 2010-10-09 ENCOUNTER — Encounter: Payer: Self-pay | Admitting: Nurse Practitioner

## 2010-10-09 ENCOUNTER — Ambulatory Visit (INDEPENDENT_AMBULATORY_CARE_PROVIDER_SITE_OTHER): Payer: Medicare Other | Admitting: Nurse Practitioner

## 2010-10-09 VITALS — BP 184/72 | HR 62 | Ht 67.0 in | Wt 112.0 lb

## 2010-10-09 DIAGNOSIS — Z79899 Other long term (current) drug therapy: Secondary | ICD-10-CM

## 2010-10-09 DIAGNOSIS — I4891 Unspecified atrial fibrillation: Secondary | ICD-10-CM

## 2010-10-09 DIAGNOSIS — I480 Paroxysmal atrial fibrillation: Secondary | ICD-10-CM | POA: Insufficient documentation

## 2010-10-09 DIAGNOSIS — I10 Essential (primary) hypertension: Secondary | ICD-10-CM

## 2010-10-09 DIAGNOSIS — I359 Nonrheumatic aortic valve disorder, unspecified: Secondary | ICD-10-CM

## 2010-10-09 MED ORDER — VALSARTAN 160 MG PO TABS: 160.0000 mg | ORAL_TABLET | Freq: Every day | ORAL | Status: DC

## 2010-10-09 NOTE — Assessment & Plan Note (Signed)
No recurrence. 

## 2010-10-09 NOTE — Assessment & Plan Note (Signed)
Her last echo was in February of 2010 and she had mild to moderate AI with mild AS. Normal EF.

## 2010-10-09 NOTE — Patient Instructions (Signed)
We are going to increase your Diovan to a total of 160 mg each day. You may two of your 80 mg tablets and use them up. The new prescription for the 160 mg tablets is at the drug store. I need to see you in about 3 weeks. Monitor your blood pressure at home. Try to limit your salt Call for any problems.

## 2010-10-09 NOTE — Assessment & Plan Note (Signed)
Blood pressure is up. It was up here 6 months ago. She does like her salt. Diovan is increased to 160 mg. I will see her back in 3 weeks.

## 2010-10-09 NOTE — Progress Notes (Signed)
[name] Date of Birth: 1920-09-30   History of Present Illness: [name] is seen today for her 6 month check. She is seen for Dr.Hochrein. She is a former patient of Dr. Ronnald Nian. She is doing well. No real complaints. She just had her physical with Dr. Waynard Edwards and got a good report. Blood pressure was up and she is to monitor at home. It is grossly elevated today. She does like her salt. No chest pain or shortness of breath. She has had her labs and CXR done by Dr. Waynard Edwards. She is tolerating her medicines.  Current Outpatient Prescriptions on File Prior to Visit  Medication Sig Dispense Refill  . amiodarone (PACERONE) 200 MG tablet TAKE 1 TABLET EVERY DAY  90 tablet  3  . aspirin EC 81 MG tablet Take 81 mg by mouth daily.        . citalopram (CELEXA) 20 MG tablet Take 20 mg by mouth daily.        . diclofenac (FLECTOR) 1.3 % PTCH Place 1 patch onto the skin as directed.        . furosemide (LASIX) 40 MG tablet Take 40 mg by mouth as needed.        . loratadine (CLARITIN) 10 MG tablet Take 10 mg by mouth as needed.        . nitroGLYCERIN (NITROSTAT) 0.4 MG SL tablet Place 0.4 mg under the tongue every 5 (five) minutes as needed.        [name] Kitchen omeprazole (PRILOSEC OTC) 20 MG tablet Take 20 mg by mouth as needed.        . rosuvastatin (CRESTOR) 10 MG tablet Take 10 mg by mouth daily.        . Vitamin D, Ergocalciferol, (DRISDOL) 50000 UNITS CAPS Take 50,000 Units by mouth every 7 (seven) days.          Allergies  Allergen Reactions  . Aspirin     REACTION: Intolerance  . Zoledronic Acid     REACTION: Intolerance    Past Medical History  Diagnosis Date  . Atrial fibrillation with rapid ventricular response Feb 2010  . Hypertension   . Flash pulmonary edema Feb 2010    Due to atrial fib with RVR  . Aortic insufficiency Feb 2010    Mild to moderate per echo  . Arthritis   . Hyperlipidemia   . Dementia   . Vitamin D deficiency   . IBS (irritable bowel syndrome)   . PUD (peptic  ulcer disease)   . Compression fracture     Past Surgical History  Procedure Date  . Cardiac catheterization 03/11/2008    EF 45%  . Appendectomy   . Transthoracic echocardiogram 03/06/2008    EF 55%    History  Smoking status  . Never Smoker   Smokeless tobacco  . Not on file    History  Alcohol Use No    Family History  Problem Relation Age of Onset  . Stroke Mother   . Cancer Mother   . Heart attack Father     Review of Systems: The review of systems is positive for some back pain and hearing loss.  All other systems were reviewed and are negative.  Physical Exam: BP 184/72  Pulse 62  Ht 5\' 7"  (1.702 m)  Wt 112 lb (50.803 kg)  BMI 17.54 kg/m2 Patient is very pleasant and in no acute distress. She is thin. Skin is warm and dry. Color is normal.  HEENT is unremarkable. Normocephalic/atraumatic. PERRL. Sclera are nonicteric. Neck is supple. No masses. No JVD. Lungs are clear. Cardiac exam shows a regular rate and rhythm. She has a soft murmur of AI noted. Abdomen is soft. Extremities are without edema. Gait and ROM are intact. No gross neurologic deficits noted.   LABORATORY DATA:   Assessment / Plan:

## 2010-10-09 NOTE — Assessment & Plan Note (Signed)
She remains on amiodarone. No breakthrough arrhythmia. She has had her labs and CXR by PCP.

## 2010-10-16 ENCOUNTER — Other Ambulatory Visit: Payer: Self-pay | Admitting: Cardiology

## 2010-10-16 NOTE — Telephone Encounter (Signed)
Refilled Meds from fax . fURTHER REFILLS BY DR. Paralee Cancel

## 2010-10-23 LAB — DIFFERENTIAL
Basophils Absolute: 0.1
Basophils Absolute: 0.1
Basophils Relative: 0
Basophils Relative: 1
Eosinophils Absolute: 0
Eosinophils Absolute: 0.1
Eosinophils Relative: 0
Eosinophils Relative: 1
Eosinophils Relative: 1
Eosinophils Relative: 1
Lymphocytes Relative: 19
Lymphocytes Relative: 22
Lymphocytes Relative: 27
Lymphs Abs: 1.5
Lymphs Abs: 2.2
Lymphs Abs: 2.5
Lymphs Abs: 2.8
Monocytes Absolute: 0.9
Monocytes Absolute: 0.9
Monocytes Absolute: 1
Monocytes Relative: 4
Monocytes Relative: 7
Monocytes Relative: 8
Monocytes Relative: 8
Monocytes Relative: 9
Neutro Abs: 8.3 — ABNORMAL HIGH
Neutro Abs: 9.2 — ABNORMAL HIGH
Neutrophils Relative %: 69
Neutrophils Relative %: 71

## 2010-10-23 LAB — CBC
HCT: 35 — ABNORMAL LOW
HCT: 30.7 — ABNORMAL LOW
HCT: 31.5 — ABNORMAL LOW
HCT: 32.4 — ABNORMAL LOW
HCT: 35.7 — ABNORMAL LOW
Hemoglobin: 11.2 — ABNORMAL LOW
Hemoglobin: 12.2
MCHC: 34.3
MCHC: 34.5
MCV: 83.2
MCV: 83.4
MCV: 83.6
MCV: 84.3
Platelets: 357
Platelets: 270
Platelets: 288
Platelets: 301
RBC: 3.68 — ABNORMAL LOW
RBC: 3.88
RBC: 4.24
RDW: 13.7
RDW: 13.9
RDW: 14
WBC: 11.6 — ABNORMAL HIGH
WBC: 11.4 — ABNORMAL HIGH
WBC: 11.6 — ABNORMAL HIGH
WBC: 13.3 — ABNORMAL HIGH

## 2010-10-23 LAB — COMPREHENSIVE METABOLIC PANEL
AST: 15
AST: 18
Albumin: 2.9 — ABNORMAL LOW
Albumin: 2.9 — ABNORMAL LOW
Calcium: 8.8
Chloride: 103
Creatinine, Ser: 0.58
Creatinine, Ser: 0.61
GFR calc Af Amer: >60
GFR calc Af Amer: >60
GFR calc non Af Amer: >60
Potassium: 3.6
Sodium: 142
Total Bilirubin: 0.2 — ABNORMAL LOW
Total Protein: 5.9 — ABNORMAL LOW
Total Protein: 6.1

## 2010-10-23 LAB — COMPREHENSIVE METABOLIC PANEL WITH GFR
ALT: 11
ALT: 16
AST: 17
AST: 17
Albumin: 2.9 — ABNORMAL LOW
Albumin: 3.1 — ABNORMAL LOW
Alkaline Phosphatase: 73
Alkaline Phosphatase: 78
BUN: 6
BUN: 7
CO2: 28
CO2: 30
Calcium: 8.7
Calcium: 9.1
Chloride: 103
Chloride: 105
Creatinine, Ser: 0.56
Creatinine, Ser: 0.59
GFR calc non Af Amer: >60
GFR calc non Af Amer: >60
Glucose, Bld: 97
Glucose, Bld: 99
Potassium: 3 — ABNORMAL LOW
Potassium: 4.2
Sodium: 140
Sodium: 142
Total Bilirubin: 0.5
Total Bilirubin: 0.5
Total Protein: 6.2
Total Protein: 6.9

## 2010-10-23 LAB — IRON AND TIBC
Iron: 34 — ABNORMAL LOW
UIBC: 214

## 2010-10-23 LAB — TSH: TSH: 1.52

## 2010-10-23 LAB — HEPATIC FUNCTION PANEL
Bilirubin, Direct: 0.2
Indirect Bilirubin: 0.2 — ABNORMAL LOW
Total Bilirubin: 0.4

## 2010-10-23 LAB — POCT I-STAT, CHEM 8
Calcium, Ion: 1.15
Creatinine, Ser: 0.8
Glucose, Bld: 165 — ABNORMAL HIGH
Hemoglobin: 11.9 — ABNORMAL LOW
Sodium: 138
TCO2: 29

## 2010-10-23 LAB — LIPASE, BLOOD: Lipase: 15

## 2010-10-23 LAB — VITAMIN B12: Vitamin B-12: 499 (ref 211–911)

## 2010-10-23 LAB — CK ISOENZYMES
CK-MB: 0 % (ref <5)
Creatine Kinase-Total: 43 U/L (ref 29–143)

## 2010-10-23 LAB — FOLATE: Folate: >20

## 2010-10-23 LAB — FERRITIN: Ferritin: 64 (ref 10–291)

## 2010-10-23 LAB — MAGNESIUM: Magnesium: 2.1

## 2010-10-30 ENCOUNTER — Ambulatory Visit (INDEPENDENT_AMBULATORY_CARE_PROVIDER_SITE_OTHER): Payer: Medicare Other | Admitting: Nurse Practitioner

## 2010-10-30 ENCOUNTER — Encounter: Payer: Self-pay | Admitting: Nurse Practitioner

## 2010-10-30 VITALS — BP 132/48 | HR 64 | Ht 66.0 in | Wt 112.0 lb

## 2010-10-30 DIAGNOSIS — I4891 Unspecified atrial fibrillation: Secondary | ICD-10-CM

## 2010-10-30 DIAGNOSIS — Z79899 Other long term (current) drug therapy: Secondary | ICD-10-CM

## 2010-10-30 DIAGNOSIS — I10 Essential (primary) hypertension: Secondary | ICD-10-CM

## 2010-10-30 DIAGNOSIS — I480 Paroxysmal atrial fibrillation: Secondary | ICD-10-CM

## 2010-10-30 LAB — BASIC METABOLIC PANEL
BUN: 17 mg/dL (ref 6–23)
CO2: 28 mEq/L (ref 19–32)
Calcium: 8.8 mg/dL (ref 8.4–10.5)
Chloride: 104 mEq/L (ref 96–112)
Creatinine, Ser: 0.7 mg/dL (ref 0.4–1.2)
GFR: 89.83 mL/min (ref >60.00)
Glucose, Bld: 79 mg/dL (ref 70–99)
Potassium: 4 mEq/L (ref 3.5–5.1)
Sodium: 139 mEq/L (ref 135–145)

## 2010-10-30 NOTE — Progress Notes (Signed)
[name] Date of Birth: [birth year]   History of Present Illness: [name] is seen back today for a 3 week check. She is seen for Dr. Antoine Poche. We increased her Diovan to 160 mg at her last visit for elevated blood pressure. She is doing well. Feels good. She did climb up in a chair and fell since her last visit. Fortunately, no injury was sustained. She turned 89 today. Rhythm is ok. No shortness of breath. She has tried to cut back on the salt. Labs and CXR are followed by Dr. Waynard Edwards. She is on chronic amiodarone therapy.   Current Outpatient Prescriptions on File Prior to Visit  Medication Sig Dispense Refill  . amiodarone (PACERONE) 200 MG tablet TAKE 1 TABLET EVERY DAY  90 tablet  3  . aspirin EC 81 MG tablet Take 81 mg by mouth daily.        . citalopram (CELEXA) 20 MG tablet Take 20 mg by mouth daily.        . diclofenac (FLECTOR) 1.3 % PTCH Place 1 patch onto the skin as directed.        . furosemide (LASIX) 40 MG tablet Take 40 mg by mouth as needed.        . loratadine (CLARITIN) 10 MG tablet Take 10 mg by mouth as needed.        . nitroGLYCERIN (NITRODUR - DOSED IN MG/24 HR) 0.4 mg/hr REMOVE OLD PATCH AND APPLY NEW PATCH EVERY NIGHT  30 patch  6  . omeprazole (PRILOSEC OTC) 20 MG tablet Take 20 mg by mouth as needed.        . rosuvastatin (CRESTOR) 10 MG tablet Take 10 mg by mouth daily.        . valsartan (DIOVAN) 160 MG tablet Take 1 tablet (160 mg total) by mouth daily.  30 tablet  4  . Vitamin D, Ergocalciferol, (DRISDOL) 50000 UNITS CAPS Take 50,000 Units by mouth every 7 (seven) days.        [name] Kitchen DISCONTD: nitroGLYCERIN (NITROSTAT) 0.4 MG SL tablet Place 0.4 mg under the tongue every 5 (five) minutes as needed.          Allergies  Allergen Reactions  . Aspirin     REACTION: Intolerance  . Zoledronic Acid     REACTION: Intolerance    Past Medical History  Diagnosis Date  . Atrial fibrillation with rapid ventricular response Feb 2010  . Hypertension   . Flash  pulmonary edema Feb 2010    Due to atrial fib with RVR  . Aortic insufficiency Feb 2010    Mild to moderate per echo  . Arthritis   . Hyperlipidemia   . Dementia   . Vitamin D deficiency   . IBS (irritable bowel syndrome)   . PUD (peptic ulcer disease)   . Compression fracture     Past Surgical History  Procedure Date  . Cardiac catheterization 03/11/2008    EF 45%  . Appendectomy   . Transthoracic echocardiogram 03/06/2008    EF 55%    History  Smoking status  . Never Smoker   Smokeless tobacco  . Not on file    History  Alcohol Use No    Family History  Problem Relation Age of Onset  . Stroke Mother   . Cancer Mother   . Heart attack Father     Review of Systems: The review of systems is positive for recent fall. She notes that she trips over her feet.  No syncope or dizziness.  All other systems were reviewed and are negative.  Physical Exam: BP 132/48  Pulse 64  Ht 5\' 6"  (1.676 m)  Wt 112 lb (50.803 kg)  BMI 18.08 kg/m2 Patient is very pleasant and in no acute distress. She is thin. Skin is warm and dry. Color is normal.  HEENT is unremarkable. Normocephalic/atraumatic. PERRL. Sclera are nonicteric. Neck is supple. No masses. No JVD. Lungs are clear. Cardiac exam shows a regular rate and rhythm. Soft murmur of AI noted. Abdomen is soft. Extremities are without edema. Gait and ROM are intact. No gross neurologic deficits noted.   LABORATORY DATA: BMET is pending.   Assessment / Plan:

## 2010-10-30 NOTE — Assessment & Plan Note (Signed)
No recurrence. 

## 2010-10-30 NOTE — Assessment & Plan Note (Signed)
Blood pressure looks better. We will check follow up BMET today. She is to continue with the higher dose of Diovan. Salt restriction is encouraged. I will see her back in about 4 months. Patient is agreeable to this plan and will call if any problems develop in the interim.

## 2010-10-30 NOTE — Assessment & Plan Note (Signed)
She remains on amiodarone. Labs and CXR are done by PCP.

## 2010-10-30 NOTE — Patient Instructions (Signed)
Your blood pressure looks better.  We are going to check your potassium level today. I want to see you in about 4 months. Call for any problems.

## 2010-12-09 ENCOUNTER — Other Ambulatory Visit: Payer: Self-pay | Admitting: Cardiology

## 2011-02-08 ENCOUNTER — Ambulatory Visit (INDEPENDENT_AMBULATORY_CARE_PROVIDER_SITE_OTHER): Payer: Medicare Other | Admitting: Nurse Practitioner

## 2011-02-08 ENCOUNTER — Encounter: Payer: Self-pay | Admitting: Nurse Practitioner

## 2011-02-08 VITALS — BP 142/58 | HR 64 | Ht 67.0 in | Wt 115.0 lb

## 2011-02-08 DIAGNOSIS — I359 Nonrheumatic aortic valve disorder, unspecified: Secondary | ICD-10-CM

## 2011-02-08 DIAGNOSIS — I480 Paroxysmal atrial fibrillation: Secondary | ICD-10-CM

## 2011-02-08 DIAGNOSIS — I7300 Raynaud's syndrome without gangrene: Secondary | ICD-10-CM | POA: Insufficient documentation

## 2011-02-08 DIAGNOSIS — I4891 Unspecified atrial fibrillation: Secondary | ICD-10-CM

## 2011-02-08 DIAGNOSIS — I10 Essential (primary) hypertension: Secondary | ICD-10-CM

## 2011-02-08 NOTE — Assessment & Plan Note (Signed)
She does note more discoloration and some tingling with the cold weather. She wears gloves and protects her hands. She is not interested in changing her medicines (i.e. Change to CCB).

## 2011-02-08 NOTE — Patient Instructions (Signed)
I think you are doing well.  Stay on your other medicines.   We will see you in 4 months. I will have you see you Dr. Antoine Poche.  Call the [facility] office at [phone] if you have any questions, problems or concerns.

## 2011-02-08 NOTE — Assessment & Plan Note (Signed)
Blood pressure looks ok. No change in her medicines. 

## 2011-02-08 NOTE — Assessment & Plan Note (Signed)
Has known mild to moderate AI. Not symptomatic.

## 2011-02-08 NOTE — Assessment & Plan Note (Signed)
No recurrence. Remains in sinus on amiodarone. Has had her labs per her PCP.

## 2011-02-08 NOTE — Progress Notes (Signed)
[name] Date of Birth: 12-19-1920 Medical Record #[id number]  History of Present Illness: [name] is seen today for a follow up visit. She is seen for Dr. Antoine Poche. She is a former patient of Dr. Ronnald Nian. She is 89. She has multiple medical issues. Despite her health problems, she continues to do pretty well. She is not having chest pain. No more falls. Tries to stay active and gets out more on the weekends. Her rhythm has been ok. She notes that her fingers are turning blue more often with the cold weather.   Current Outpatient Prescriptions on File Prior to Visit  Medication Sig Dispense Refill  . amiodarone (PACERONE) 200 MG tablet TAKE 1 TABLET EVERY DAY  90 tablet  3  . aspirin EC 81 MG tablet Take 81 mg by mouth daily.        . citalopram (CELEXA) 20 MG tablet Take 20 mg by mouth daily.        [name] Kitchen loratadine (CLARITIN) 10 MG tablet Take 10 mg by mouth daily.       . nitroGLYCERIN (NITRODUR - DOSED IN MG/24 HR) 0.4 mg/hr REMOVE OLD PATCH AND APPLY NEW PATCH EVERY NIGHT  30 patch  6  . omeprazole (PRILOSEC OTC) 20 MG tablet Take 20 mg by mouth as needed.        . rosuvastatin (CRESTOR) 10 MG tablet Take 10 mg by mouth daily.        . valsartan (DIOVAN) 160 MG tablet Take 1 tablet (160 mg total) by mouth daily.  30 tablet  4  . Vitamin D, Ergocalciferol, (DRISDOL) 50000 UNITS CAPS Take 50,000 Units by mouth every 7 (seven) days.        [name] Kitchen DISCONTD: amiodarone (PACERONE) 200 MG tablet TAKE 1 TABLET (200 MG TOTAL) BY MOUTH DAILY.  30 tablet  6    Allergies  Allergen Reactions  . Aspirin     REACTION: Intolerance  . Zoledronic Acid     REACTION: Intolerance    Past Medical History  Diagnosis Date  . Atrial fibrillation with rapid ventricular response Feb 2010  . Hypertension   . Flash pulmonary edema Feb 2010    Due to atrial fib with RVR  . Aortic insufficiency Feb 2010    Mild to moderate per echo with normal EF  . Arthritis   . Hyperlipidemia   . Dementia   . Vitamin D  deficiency   . IBS (irritable bowel syndrome)   . PUD (peptic ulcer disease)   . Compression fracture   . Raynaud phenomenon     Past Surgical History  Procedure Date  . Cardiac catheterization 03/11/2008    EF 45%  . Appendectomy   . Transthoracic echocardiogram 03/06/2008    EF 55%    History  Smoking status  . Never Smoker   Smokeless tobacco  . Not on file    History  Alcohol Use No    Family History  Problem Relation Age of Onset  . Stroke Mother   . Cancer Mother   . Heart attack Father     Review of Systems: The review of systems is per the HPI.  She has had all of her labs done in September with Dr. Waynard Edwards. Those results were reviewed. She has some tingling occasionally and fingers turning blue with the cold weather.  All other systems were reviewed and are negative.  Physical Exam: BP 142/58  Pulse 64  Ht 5\' 7"  (1.702 m)  Wt 115  lb (52.164 kg)  BMI 18.01 kg/m2 Patient is very pleasant and in no acute distress. She looks younger than her stated age. She is quite thin. Skin is warm and dry. Color is normal.  HEENT is unremarkable. Normocephalic/atraumatic. PERRL. Sclera are nonicteric. Neck is supple. No masses. No JVD. Lungs are clear. Cardiac exam shows a regular rate and rhythm. Soft murmur of AI noted. Abdomen is soft. Extremities are without edema. Her fingers do appear quite cyanotic but it is resolved by the end of her visit.  Gait and ROM are intact. No gross neurologic deficits noted.   LABORATORY DATA:   Assessment / Plan:

## 2011-04-22 ENCOUNTER — Other Ambulatory Visit: Payer: Self-pay | Admitting: Nurse Practitioner

## 2011-04-24 ENCOUNTER — Other Ambulatory Visit: Payer: Self-pay | Admitting: Internal Medicine

## 2011-04-24 DIAGNOSIS — Z1231 Encounter for screening mammogram for malignant neoplasm of breast: Secondary | ICD-10-CM

## 2011-05-29 ENCOUNTER — Ambulatory Visit
Admission: RE | Admit: 2011-05-29 | Discharge: 2011-05-29 | Disposition: A | Discharge status: Home / Self Care | Payer: Medicare Other | Source: Ambulatory Visit | Attending: Internal Medicine | Admitting: Internal Medicine

## 2011-05-29 DIAGNOSIS — Z1231 Encounter for screening mammogram for malignant neoplasm of breast: Secondary | ICD-10-CM

## 2011-06-05 ENCOUNTER — Other Ambulatory Visit: Payer: Self-pay | Admitting: Nurse Practitioner

## 2011-07-22 ENCOUNTER — Other Ambulatory Visit: Payer: Self-pay | Admitting: Nurse Practitioner

## 2011-09-19 ENCOUNTER — Other Ambulatory Visit: Payer: Self-pay | Admitting: Nurse Practitioner

## 2012-03-18 ENCOUNTER — Other Ambulatory Visit: Payer: Self-pay | Admitting: Nurse Practitioner

## 2012-04-21 ENCOUNTER — Other Ambulatory Visit: Payer: Self-pay

## 2012-04-21 DIAGNOSIS — Z1231 Encounter for screening mammogram for malignant neoplasm of breast: Secondary | ICD-10-CM

## 2012-05-29 ENCOUNTER — Ambulatory Visit: Payer: Medicare Other

## 2012-06-02 ENCOUNTER — Inpatient Hospital Stay (HOSPITAL_COMMUNITY): Admission: RE | Admit: 2012-06-02 | Payer: Medicare Other | Source: Ambulatory Visit

## 2012-06-03 ENCOUNTER — Ambulatory Visit: Payer: Medicare Other | Admitting: Nurse Practitioner

## 2012-06-04 ENCOUNTER — Ambulatory Visit
Admission: RE | Admit: 2012-06-04 | Discharge: 2012-06-04 | Disposition: A | Discharge status: Home / Self Care | Payer: Medicare Other | Source: Ambulatory Visit

## 2012-06-04 DIAGNOSIS — Z1231 Encounter for screening mammogram for malignant neoplasm of breast: Secondary | ICD-10-CM

## 2012-06-15 ENCOUNTER — Encounter: Payer: Self-pay | Admitting: Physician Assistant

## 2012-06-15 ENCOUNTER — Ambulatory Visit (INDEPENDENT_AMBULATORY_CARE_PROVIDER_SITE_OTHER): Payer: Medicare Other | Admitting: Physician Assistant

## 2012-06-15 VITALS — BP 160/60 | HR 59 | Ht 66.0 in | Wt 109.0 lb

## 2012-06-15 DIAGNOSIS — I480 Paroxysmal atrial fibrillation: Secondary | ICD-10-CM

## 2012-06-15 DIAGNOSIS — I4891 Unspecified atrial fibrillation: Secondary | ICD-10-CM

## 2012-06-15 DIAGNOSIS — I10 Essential (primary) hypertension: Secondary | ICD-10-CM

## 2012-06-15 DIAGNOSIS — I25810 Atherosclerosis of coronary artery bypass graft(s) without angina pectoris: Secondary | ICD-10-CM

## 2012-06-15 DIAGNOSIS — R0989 Other specified symptoms and signs involving the circulatory and respiratory systems: Secondary | ICD-10-CM | POA: Insufficient documentation

## 2012-06-15 NOTE — Progress Notes (Signed)
HPI:  This is a very pleasant 79 year old female patient of Dr.Hochrein who has history of paroxysmal atrial fibrillation, flash pulmonary edema associated with rapid atrial fibrillation, and mild to moderate aortic insufficiency on 2-D echo in 2010. She had moderate coronary artery disease on catheter in 2010 with 60% ostial stenosis of the RCA and 60-70% mid LAD.ejection fraction was 45% in addition medical therapy was recommended.  The patient is doing well without complaints of chest pain, palpitations, dyspnea, dizziness, or presyncope. Her husband of 64 years died last 11/16/2022. She will turn 89 in the next several months. She lives alone and is doing quite well. She says she only gets out of breath if she does a lot of raking or sweeping.  Allergies:  -- Aspirin    --  REACTION: Intolerance  -- Zoledronic Acid    --  REACTION: Intolerance  Current Outpatient Prescriptions on File Prior to Visit: amiodarone (PACERONE) 200 MG tablet, TAKE 1 TABLET EVERY DAY, Disp: 90 tablet, Rfl: 3 amiodarone (PACERONE) 200 MG tablet, TAKE 1 TABLET BY MOUTH EVERY DAY, Disp: 30 tablet, Rfl: 6 aspirin EC 81 MG tablet, Take 81 mg by mouth daily.  , Disp: , Rfl:  citalopram (CELEXA) 20 MG tablet, Take 20 mg by mouth daily.  , Disp: , Rfl:  DIOVAN 160 MG tablet, TAKE 1 TABLET EVERY DAY, Disp: 30 tablet, Rfl: 4 loratadine (CLARITIN) 10 MG tablet, Take 10 mg by mouth daily. , Disp: , Rfl:  nitroGLYCERIN (NITRODUR - DOSED IN MG/24 HR) 0.4 mg/hr, REMOVE OLD PATCH AND APPLY NEW PATCH EVERY NIGHT, Disp: 30 patch, Rfl: 6 omeprazole (PRILOSEC OTC) 20 MG tablet, Take 20 mg by mouth as needed.  , Disp: , Rfl:  rosuvastatin (CRESTOR) 10 MG tablet, Take 10 mg by mouth daily.  , Disp: , Rfl:  Vitamin D, Ergocalciferol, (DRISDOL) 50000 UNITS CAPS, Take 50,000 Units by mouth every 7 (seven) days.  , Disp: , Rfl:   No current facility-administered medications on file prior to visit.   Past Medical History:   Atrial  fibrillation with rapid ventricular res* Feb 2010     Hypertension                                                 Flash pulmonary edema                           Feb 2010       Comment:Due to atrial fib with RVR   Aortic insufficiency                            Feb 2010       Comment:Mild to moderate per echo with normal EF   Arthritis                                                    Hyperlipidemia  Dementia                                                     Vitamin D deficiency                                         IBS (irritable bowel syndrome)                               PUD (peptic ulcer disease)                                   Compression fracture                                         Raynaud phenomenon                                          Past Surgical History:   CARDIAC CATHETERIZATION                          03/11/2008      Comment:EF 45%   APPENDECTOMY                                                  TRANSTHORACIC ECHOCARDIOGRAM                     03/06/2008       Comment:EF 55%  Review of patient's family history indicates:   Stroke                         Mother                   Cancer                         Mother                   Heart attack                   Father                   Social History   Marital Status: Married             Spouse Name:                      Years of Education:                 Number of children:             Occupational History   None on file  Social History Main Topics  Smoking Status: Never Smoker                     Smokeless Status: Not on file                      Alcohol Use: No             Drug Use: No             Sexual Activity:                    Other Topics            Concern   None on file  Social History Narrative   None on file    ROS:see history of present illness otherwise negative   PHYSICAL EXAM: Well-nournished, young looking 6 yr.old in  no acute distress. Neck: bilateral carotid bruits versus murmur transmitted,No JVD, HJR,  or thyroid enlargement  Lungs: No tachypnea, clear without wheezing, rales, or rhonchi  Cardiovascular: RRR, PMI not displaced, heart sounds normal,1/6 diastolic murmur at the left sternal border, 2/6 systolic murmur at the apex, no gallops, bruit, thrill, or heave.  Abdomen: BS normal. Soft without organomegaly, masses, lesions or tenderness.  Extremities: without cyanosis, clubbing or edema. Good distal pulses bilateral  SKin: Warm, no lesions or rashes   Musculoskeletal: No deformities  Neuro: no focal signs  BP 160/60  Pulse 59  Ht 5\' 6"  (1.676 m)  Wt 109 lb (49.442 kg)  BMI 17.6 kg/m2   ZOX:WRUEA bradycardia at 59 beats per minute with LVH, no acute change

## 2012-06-15 NOTE — Patient Instructions (Addendum)
**Note De-Identified  Obfuscation** Your physician has requested that you have a carotid duplex. This test is an ultrasound of the carotid arteries in your neck. It looks at blood flow through these arteries that supply the brain with blood. Allow one hour for this exam. There are no restrictions or special instructions.  Your physician wants you to follow-up in: 5 months. You will receive a reminder letter in the mail two months in advance. If you don't receive a letter, please call our office to schedule the follow-up appointment.

## 2012-06-15 NOTE — Assessment & Plan Note (Signed)
Maintaining normal sinus rhythm 

## 2012-06-15 NOTE — Assessment & Plan Note (Signed)
Blood pressure a little on the high side today, recommend low-sodium diet

## 2012-06-15 NOTE — Assessment & Plan Note (Signed)
Check carotid Dopplers °

## 2012-06-15 NOTE — Assessment & Plan Note (Signed)
Patient has moderate CAD on catheter in 2010 but is doing well without chest pain. She does have some dyspnea on exertion that could be an anginal equivalent but only when she over exerts herself.

## 2012-06-17 ENCOUNTER — Other Ambulatory Visit: Payer: Self-pay | Admitting: Nurse Practitioner

## 2012-06-24 ENCOUNTER — Encounter (INDEPENDENT_AMBULATORY_CARE_PROVIDER_SITE_OTHER): Payer: Medicare Other

## 2012-06-24 DIAGNOSIS — I6529 Occlusion and stenosis of unspecified carotid artery: Secondary | ICD-10-CM

## 2012-06-24 DIAGNOSIS — R0989 Other specified symptoms and signs involving the circulatory and respiratory systems: Secondary | ICD-10-CM

## 2012-10-22 ENCOUNTER — Other Ambulatory Visit: Payer: Self-pay | Admitting: Nurse Practitioner

## 2012-11-03 ENCOUNTER — Encounter: Payer: Self-pay | Admitting: Cardiology

## 2012-11-03 ENCOUNTER — Ambulatory Visit (INDEPENDENT_AMBULATORY_CARE_PROVIDER_SITE_OTHER): Payer: Medicare Other | Admitting: Cardiology

## 2012-11-03 VITALS — BP 122/56 | HR 61 | Ht 66.0 in | Wt 109.0 lb

## 2012-11-03 DIAGNOSIS — I25810 Atherosclerosis of coronary artery bypass graft(s) without angina pectoris: Secondary | ICD-10-CM

## 2012-11-03 DIAGNOSIS — I4891 Unspecified atrial fibrillation: Secondary | ICD-10-CM

## 2012-11-03 DIAGNOSIS — I480 Paroxysmal atrial fibrillation: Secondary | ICD-10-CM

## 2012-11-03 NOTE — Progress Notes (Signed)
HPI The patient presents for follow up of CAD and atrial fib.  She was here earlier this year.  She has been doing well since she was seen.  The patient denies any new symptoms such as chest discomfort, neck or arm discomfort. There has been no new shortness of breath, PND or orthopnea. There have been no reported palpitations, presyncope or syncope.  She lives alone and does some light housekeeping.   The patient denies any new symptoms such as chest discomfort, neck or arm discomfort. There has been no new shortness of breath, PND or orthopnea. There have been no reported palpitations, presyncope or syncope.  Allergies  Allergen Reactions  . Aspirin     REACTION: Intolerance  . Zoledronic Acid     REACTION: Intolerance    Current Outpatient Prescriptions  Medication Sig Dispense Refill  . amiodarone (PACERONE) 200 MG tablet TAKE 1 TABLET BY MOUTH EVERY DAY  30 tablet  6  . aspirin EC 81 MG tablet Take 81 mg by mouth daily.        . citalopram (CELEXA) 20 MG tablet Take 20 mg by mouth daily.        [name] Kitchen DIOVAN 160 MG tablet TAKE 1 TABLET EVERY DAY  30 tablet  4  . ferrous sulfate 220 (44 FE) MG/5ML solution Take 220 mg by mouth daily. TAKE ONE 5 ML  DROPPER DAILY      . loratadine (CLARITIN) 10 MG tablet Take 10 mg by mouth daily.       . nitroGLYCERIN (NITRODUR - DOSED IN MG/24 HR) 0.4 mg/hr REMOVE OLD PATCH AND APPLY NEW PATCH EVERY NIGHT  30 patch  4  . omeprazole (PRILOSEC OTC) 20 MG tablet Take 20 mg by mouth as needed.        . rosuvastatin (CRESTOR) 10 MG tablet Take 10 mg by mouth daily.        . Vitamin D, Ergocalciferol, (DRISDOL) 50000 UNITS CAPS Take 50,000 Units by mouth every 7 (seven) days.         No current facility-administered medications for this visit.    Past Medical History  Diagnosis Date  . Atrial fibrillation with rapid ventricular response Feb 2010  . Hypertension   . Flash pulmonary edema Feb 2010    Due to atrial fib with RVR  . Aortic insufficiency  Feb 2010    Mild to moderate per echo with normal EF  . Arthritis   . Hyperlipidemia   . Dementia   . Vitamin D deficiency   . IBS (irritable bowel syndrome)   . PUD (peptic ulcer disease)   . Compression fracture   . Raynaud phenomenon     Past Surgical History  Procedure Laterality Date  . Cardiac catheterization  03/11/2008    EF 45%  . Appendectomy    . Transthoracic echocardiogram  03/06/2008    EF 55%    ROS:  As stated in the HPI and negative for all other systems.  PHYSICAL EXAM BP 122/56  Pulse 61  Ht 5\' 6"  (1.676 m)  Wt 109 lb (49.442 kg)  BMI 17.6 kg/m2 GENERAL:  Well appearing, she looks good for her age.  HEENT:  Pupils equal round and reactive, fundi not visualized, oral mucosa unremarkable NECK:  No jugular venous distention, waveform within normal limits, carotid upstroke brisk and symmetric, no bruits, no thyromegaly LYMPHATICS:  No cervical, inguinal adenopathy LUNGS:  Clear to auscultation bilaterally BACK:  No CVA tenderness CHEST:  Unremarkable  HEART:  PMI not displaced or sustained,S1 and S2 within normal limits, no S3, no S4, no clicks, no rubs, brief systolic murmur, early peaking, no diastolic  murmurs ABD:  Flat, positive bowel sounds normal in frequency in pitch, no bruits, no rebound, no guarding, no midline pulsatile mass, no hepatomegaly, no splenomegaly EXT:  2 plus pulses throughout, no edema, no cyanosis no clubbing SKIN:  No rashes no nodules NEURO:  Cranial nerves II through XII grossly intact, motor grossly intact throughout PSYCH:  Cognitively intact, oriented to person place and time   EKG:  Sinus rhythm, rate 61, axis within normal limits, intervals within normal limits, no acute ST-T wave changes.  11/03/2012  ASSESSMENT AND PLAN  ATRIAL FIB:  She has  Had no paroxysms of this. She understands the need to get liver profile and TSH a couple of times a year and she will do this with PERINI,MARK A, MD  CAD:   I reviewed her  catheterization from 2010. She had nonobstructive disease at that time and no symptoms.  No further cardiovascular testing is indicated.  We will continue with aggressive risk reduction and meds as listed.  HTN:  The blood pressure is at target. No change in medications is indicated. We will continue with therapeutic lifestyle changes (TLC).  CAROTID BRUIT:  She has nonobstructive stenosis and she will have this followed up in May.   I reviewed the carotid Doppler

## 2012-11-03 NOTE — Patient Instructions (Addendum)
The current medical regimen is effective;  continue present plan and medications.  Follow up with Norma Fredrickson, NP in 05/2013.

## 2013-02-24 ENCOUNTER — Encounter: Payer: Self-pay | Admitting: Cardiology

## 2013-04-03 ENCOUNTER — Other Ambulatory Visit: Payer: Self-pay | Admitting: Nurse Practitioner

## 2013-05-25 ENCOUNTER — Other Ambulatory Visit: Payer: Self-pay

## 2013-05-25 DIAGNOSIS — Z1231 Encounter for screening mammogram for malignant neoplasm of breast: Secondary | ICD-10-CM

## 2013-06-02 ENCOUNTER — Ambulatory Visit (INDEPENDENT_AMBULATORY_CARE_PROVIDER_SITE_OTHER): Payer: Medicare Other | Admitting: Nurse Practitioner

## 2013-06-02 ENCOUNTER — Encounter: Payer: Self-pay | Admitting: Nurse Practitioner

## 2013-06-02 ENCOUNTER — Other Ambulatory Visit: Payer: Self-pay | Admitting: Nurse Practitioner

## 2013-06-02 VITALS — BP 130/60 | HR 60 | Ht 67.0 in | Wt 103.4 lb

## 2013-06-02 DIAGNOSIS — I4891 Unspecified atrial fibrillation: Secondary | ICD-10-CM

## 2013-06-02 DIAGNOSIS — Z79899 Other long term (current) drug therapy: Secondary | ICD-10-CM

## 2013-06-02 DIAGNOSIS — I25810 Atherosclerosis of coronary artery bypass graft(s) without angina pectoris: Secondary | ICD-10-CM

## 2013-06-02 DIAGNOSIS — I480 Paroxysmal atrial fibrillation: Secondary | ICD-10-CM

## 2013-06-02 LAB — CBC
HCT: 34.2 % — ABNORMAL LOW (ref 36.0–46.0)
Hemoglobin: 11.2 g/dL — ABNORMAL LOW (ref 12.0–15.0)
MCHC: 32.7 g/dL (ref 30.0–36.0)
MCV: 92.1 fl (ref 78.0–100.0)
Platelets: 243 10*3/uL (ref 150.0–400.0)
RBC: 3.72 Mil/uL — ABNORMAL LOW (ref 3.87–5.11)
RDW: 14.7 % (ref 11.5–15.5)
WBC: 9 10*3/uL (ref 4.0–10.5)

## 2013-06-02 LAB — HEPATIC FUNCTION PANEL
ALT: 19 U/L (ref 0–35)
AST: 23 U/L (ref 0–37)
Albumin: 4 g/dL (ref 3.5–5.2)
Alkaline Phosphatase: 62 U/L (ref 39–117)
Bilirubin, Direct: 0.1 mg/dL (ref 0.0–0.3)
Total Bilirubin: 0.4 mg/dL (ref 0.2–1.2)
Total Protein: 7.8 g/dL (ref 6.0–8.3)

## 2013-06-02 LAB — BASIC METABOLIC PANEL
BUN: 18 mg/dL (ref 6–23)
CO2: 29 mEq/L (ref 19–32)
Calcium: 9.4 mg/dL (ref 8.4–10.5)
Chloride: 101 mEq/L (ref 96–112)
Creatinine, Ser: 0.8 mg/dL (ref 0.4–1.2)
GFR: 70.51 mL/min (ref >60.00)
Glucose, Bld: 91 mg/dL (ref 70–99)
Potassium: 5.1 mEq/L (ref 3.5–5.1)
Sodium: 136 mEq/L (ref 135–145)

## 2013-06-02 LAB — TSH: TSH: 1.7 u[IU]/mL (ref 0.35–4.50)

## 2013-06-02 MED ORDER — AMIODARONE HCL 200 MG PO TABS: ORAL_TABLET | ORAL | Status: AC

## 2013-06-02 NOTE — Patient Instructions (Addendum)
We will check labs today  We will check an EKG today  Stay on your current medicines but I am going to cut back the amiodarone to 1/2 tablet each day  I will see you in 6 months  Call the [facility] office at [phone] if you have any questions, problems or concerns.

## 2013-06-02 NOTE — Progress Notes (Signed)
April SkeetersEula W Villegas Date of Birth: 10/12/1920 Medical Record #[id number]  History of Present Illness: [name] is seen back today for a 7 month check. Seen for Dr. Antoine PocheHochrein. She is a former patient of Dr. Ronnald Nianennant's. She has a history of paroxysmal atrial fibrillation, flash pulmonary edema associated with rapid atrial fibrillation, and mild to moderate aortic insufficiency on 2-D echo in 2010. She had moderate coronary artery disease on cath in 2010 with 60% ostial stenosis of the RCA and 60-70% mid LAD with an EF of 45%. She has been managed medically. Other issues include PUD, IBS, and carotid disease. Sees Dr. Waynard EdwardsPerini for primary care.   Last seen here in October of 2014 by Dr. Antoine PocheHochrein - felt to be doing ok.   Comes back today. Here with her daughter, [name]. Her husband passed away 2 years ago with cancer. Overall, she is doing ok. Losing weight. Reports that she has had positive hemoccults - does not wish to proceed with colonoscopy. No pain anywhere. Rhythm ok. Not short of breath. No falls. Using a cane. Tolerating her medicines. Her dose of Diovan has been cut back due to low BP and dizziness - this has resolved. "Spit up" some blood last week - has had chronic nose bleeds and thinks that was the issue and that she just swallowed it.    Current Outpatient Prescriptions  Medication Sig Dispense Refill  . amiodarone (PACERONE) 200 MG tablet TAKE 1 TABLET BY MOUTH EVERY DAY  30 tablet  6  . aspirin EC 81 MG tablet Take 81 mg by mouth daily.        [name] Kitchen. atorvastatin (LIPITOR) 20 MG tablet Take 20 mg by mouth daily.      . citalopram (CELEXA) 20 MG tablet Take 20 mg by mouth daily.        [name] Kitchen. denosumab (PROLIA) 60 MG/ML SOLN injection Inject 60 mg into the skin every 6 (six) months. Administer in upper arm, thigh, or abdomen      . ENSURE (ENSURE) Take 237 mLs by mouth daily.      . nitroGLYCERIN (NITRODUR - DOSED IN MG/24 HR) 0.4 mg/hr patch REMOVE OLD PATCH AND APPLY NEW PATCH EVERY NIGHT  30 patch  4    . valsartan (DIOVAN) 160 MG tablet one half table every day      . Vitamin D, Ergocalciferol, (DRISDOL) 50000 UNITS CAPS Take 50,000 Units by mouth every 7 (seven) days.         No current facility-administered medications for this visit.    Allergies  Allergen Reactions  . Aspirin     REACTION: Intolerance  . Zoledronic Acid     REACTION: Intolerance    Past Medical History  Diagnosis Date  . Atrial fibrillation with rapid ventricular response Feb 2010  . Hypertension   . Flash pulmonary edema Feb 2010    Due to atrial fib with RVR  . Aortic insufficiency Feb 2010    Mild to moderate per echo with normal EF  . Arthritis   . Hyperlipidemia   . Dementia   . Vitamin D deficiency   . IBS (irritable bowel syndrome)   . PUD (peptic ulcer disease)   . Compression fracture   . Raynaud phenomenon   . CAD (coronary artery disease)     2010.  60% RCA, 60 - 70% LAD  . Carotid stenosis     40 - 59% bilateral    Past Surgical History  Procedure Laterality Date  .  Cardiac catheterization  03/11/2008    EF 45%  . Appendectomy    . Transthoracic echocardiogram  03/06/2008    EF 55%    History  Smoking status  . Never Smoker   Smokeless tobacco  . Not on file    History  Alcohol Use No    Family History  Problem Relation Age of Onset  . Stroke Mother   . Cancer Mother   . Heart attack Father     Review of Systems: The review of systems is per the HPI.  All other systems were reviewed and are negative.  Physical Exam: BP 130/60  Pulse 60  Ht 5\' 7"  (1.702 m)  Wt 103 lb 6.4 oz (46.902 kg)  BMI 16.19 kg/m2 Patient is very pleasant and in no acute distress. She is quite thin. She is kyphotic. Weight down 6 pounds since last visit. Skin is warm and dry. Color is normal.  HEENT is unremarkable. Normocephalic/atraumatic. PERRL. Sclera are nonicteric. Neck is supple. No masses. No JVD. Lungs are clear. Cardiac exam shows a regular rate and rhythm. Abdomen is soft.  Extremities are without edema. Gait and ROM are intact. No gross neurologic deficits noted.  Wt Readings from Last 3 Encounters:  06/02/13 103 lb 6.4 oz (46.902 kg)  11/03/12 109 lb (49.442 kg)  06/15/12 109 lb (49.442 kg)     LABORATORY DATA: PENDING   Lab Results  Component Value Date   WBC 11.1* 03/12/2008   HGB 11.3* 03/12/2008   HCT 32.2* 03/12/2008   PLT 300 03/12/2008   GLUCOSE 79 10/30/2010   ALT 60* 03/05/2008   AST 114* 03/05/2008   NA 139 10/30/2010   K 4.0 10/30/2010   CL 104 10/30/2010   CREATININE 0.7 10/30/2010   BUN 17 10/30/2010   CO2 28 10/30/2010   TSH 5.224 Test methodology is 3rd generation TSH 03/05/2008   INR 1.4 03/05/2008     Assessment / Plan:  1. PAF - on amiodarone - will check EKG today. In sinus on exam. Check follow up labs for her amiodarone today. Her EKG shows sinus. QT is prolonged at 502 and QTc at 513 - she is also on Celexa - I am cutting the amiodarone back to 100 mg a day.   2. Advanced age  273. CAD - managed medically - no chest pain reported.   4. Valvular heart disease   5. Weight loss - worrisome that she may have some underlying cancer - she does not wish to have further testing.   I will see her back in 6 months.  Patient is agreeable to this plan and will call if any problems develop in the interim.   Rosalio MacadamiaLori C. Myrtis Maille, RN, ANP-C [facility] [address] [place], [place]  [id number] [phone]

## 2013-06-11 ENCOUNTER — Inpatient Hospital Stay: Admission: RE | Admit: 2013-06-11 | Payer: Medicare Other | Source: Ambulatory Visit

## 2013-09-13 ENCOUNTER — Encounter: Payer: Self-pay | Admitting: Nurse Practitioner

## 2013-12-01 ENCOUNTER — Encounter: Payer: Self-pay | Admitting: Nurse Practitioner

## 2013-12-01 ENCOUNTER — Ambulatory Visit (INDEPENDENT_AMBULATORY_CARE_PROVIDER_SITE_OTHER): Payer: Medicare Other | Admitting: Nurse Practitioner

## 2013-12-01 VITALS — BP 150/60 | HR 62 | Ht 66.0 in | Wt 108.4 lb

## 2013-12-01 DIAGNOSIS — I480 Paroxysmal atrial fibrillation: Secondary | ICD-10-CM

## 2013-12-01 DIAGNOSIS — I38 Endocarditis, valve unspecified: Secondary | ICD-10-CM

## 2013-12-01 DIAGNOSIS — I259 Chronic ischemic heart disease, unspecified: Secondary | ICD-10-CM

## 2013-12-01 DIAGNOSIS — Z79899 Other long term (current) drug therapy: Secondary | ICD-10-CM

## 2013-12-01 LAB — BASIC METABOLIC PANEL
BUN: 16 mg/dL (ref 6–23)
CO2: 28 mEq/L (ref 19–32)
Calcium: 9.2 mg/dL (ref 8.4–10.5)
Chloride: 101 mEq/L (ref 96–112)
Creatinine, Ser: 0.8 mg/dL (ref 0.4–1.2)
GFR: 74.67 mL/min (ref >60.00)
Glucose, Bld: 87 mg/dL (ref 70–99)
Potassium: 4.5 mEq/L (ref 3.5–5.1)
Sodium: 136 mEq/L (ref 135–145)

## 2013-12-01 LAB — LIPID PANEL
Cholesterol: 139 mg/dL (ref 0–200)
HDL: 39.3 mg/dL (ref >39.00)
LDL Cholesterol: 68 mg/dL (ref 0–99)
NonHDL: 99.7
Total CHOL/HDL Ratio: 4
Triglycerides: 160 mg/dL — ABNORMAL HIGH (ref 0.0–149.0)
VLDL: 32 mg/dL (ref 0.0–40.0)

## 2013-12-01 LAB — HEPATIC FUNCTION PANEL
ALT: 15 U/L (ref 0–35)
AST: 20 U/L (ref 0–37)
Albumin: 3.5 g/dL (ref 3.5–5.2)
Alkaline Phosphatase: 61 U/L (ref 39–117)
Bilirubin, Direct: 0 mg/dL (ref 0.0–0.3)
Total Bilirubin: 0.5 mg/dL (ref 0.2–1.2)
Total Protein: 7.9 g/dL (ref 6.0–8.3)

## 2013-12-01 LAB — CBC
HCT: 33.7 % — ABNORMAL LOW (ref 36.0–46.0)
Hemoglobin: 11.1 g/dL — ABNORMAL LOW (ref 12.0–15.0)
MCHC: 32.9 g/dL (ref 30.0–36.0)
MCV: 90.6 fl (ref 78.0–100.0)
Platelets: 226 10*3/uL (ref 150.0–400.0)
RBC: 3.71 Mil/uL — ABNORMAL LOW (ref 3.87–5.11)
RDW: 14.8 % (ref 11.5–15.5)
WBC: 7.7 10*3/uL (ref 4.0–10.5)

## 2013-12-01 LAB — TSH: TSH: 1.7 u[IU]/mL (ref 0.35–4.50)

## 2013-12-01 NOTE — Progress Notes (Signed)
[name] Date of Birth: 02/05/1920 Medical Record #[id number]  History of Present Illness: Ms. [name] is seen back today for a 6 month check. Seen for Dr. Antoine PocheHochrein. She is a former patient of Dr. Ronnald Nianennant's. She has a history of paroxysmal atrial fibrillation, flash pulmonary edema associated with rapid atrial fibrillation, and mild to moderate aortic insufficiency on 2-D echo in 2010. She had moderate coronary artery disease on cath in 2010 with 60% ostial stenosis of the RCA and 60-70% mid LAD with an EF of 45%. She has been managed medically. Other issues include PUD, IBS, and carotid disease. Sees Dr. Waynard EdwardsPerini for primary care.   Last seen here in May of 2015 by myself - felt to be doing ok. She did not that she had had positive hemoccult cards and did not wish to pursue any workup. Her dose of Diovan had been cut back due to low BP and dizziness. She was otherwise stable. Her EKG showed sinus but with prolonged QT at 502 and QTc at 513 - she was also on Celexa - I cut her the amiodarone back to 100 mg a day.   Comes back today. Here with her grand daughter, [name]. Doing ok. Now 80 years of age. No falls. Using a cane. Still lives alone but has good family support. No chest pain. Not short of breath. Family concerned about the amount of frozen dinners she eats. She does this because it is easier than cooking and now she is alone. Tolerating her medicines.   Current Outpatient Prescriptions  Medication Sig Dispense Refill  . amiodarone (PACERONE) 200 MG tablet TAKE 1/2 TABLET BY MOUTH EVERY DAY 30 tablet 6  . aspirin EC 81 MG tablet Take 81 mg by mouth daily.      [name] Kitchen. atorvastatin (LIPITOR) 20 MG tablet Take 20 mg by mouth daily.    . citalopram (CELEXA) 20 MG tablet Take 20 mg by mouth daily.      [name] Kitchen. denosumab (PROLIA) 60 MG/ML SOLN injection Inject 60 mg into the skin every 6 (six) months. Administer in upper arm, thigh, or abdomen    . ENSURE (ENSURE) Take 237 mLs by mouth daily.    .  nitroGLYCERIN (NITRODUR - DOSED IN MG/24 HR) 0.4 mg/hr patch REMOVE OLD PATCH AND APPLY NEW PATCH EVERY NIGHT 30 patch 4  . valsartan (DIOVAN) 160 MG tablet one half table every day    . Vitamin D, Ergocalciferol, (DRISDOL) 50000 UNITS CAPS Take 50,000 Units by mouth every 7 (seven) days.       No current facility-administered medications for this visit.    Allergies  Allergen Reactions  . Aspirin     REACTION: Intolerance  . Zoledronic Acid     REACTION: Intolerance    Past Medical History  Diagnosis Date  . Atrial fibrillation with rapid ventricular response Feb 2010  . Hypertension   . Flash pulmonary edema Feb 2010    Due to atrial fib with RVR  . Aortic insufficiency Feb 2010    Mild to moderate per echo with normal EF  . Arthritis   . Hyperlipidemia   . Dementia   . Vitamin D deficiency   . IBS (irritable bowel syndrome)   . PUD (peptic ulcer disease)   . Compression fracture   . Raynaud phenomenon   . CAD (coronary artery disease)     2010.  60% RCA, 60 - 70% LAD  . Carotid stenosis     40 - 59% bilateral  Past Surgical History  Procedure Laterality Date  . Cardiac catheterization  03/11/2008    EF 45%  . Appendectomy    . Transthoracic echocardiogram  03/06/2008    EF 55%    History  Smoking status  . Never Smoker   Smokeless tobacco  . Not on file    History  Alcohol Use No    Family History  Problem Relation Age of Onset  . Stroke Mother   . Cancer Mother   . Heart attack Father     Review of Systems: The review of systems is per the HPI.  All other systems were reviewed and are negative.  Physical Exam: BP 150/60 mmHg  Pulse 62  Ht 5\' 6"  (1.676 m)  Wt 108 lb 6.4 oz (49.17 kg)  BMI 17.50 kg/m2  SpO2 99% Patient is very pleasant and in no acute distress. She is quite thin but her weight has improved. Skin is warm and dry. Color is normal.  HEENT is unremarkable. Normocephalic/atraumatic. PERRL. Sclera are nonicteric. Neck is supple. No  masses. No JVD. Lungs are clear. Cardiac exam shows a regular rate and rhythm. Outflow murmur noted. Abdomen is soft. Extremities are without edema. Gait and ROM are intact. No gross neurologic deficits noted.  Wt Readings from Last 3 Encounters:  12/01/13 108 lb 6.4 oz (49.17 kg)  06/02/13 103 lb 6.4 oz (46.902 kg)  11/03/12 109 lb (49.442 kg)    LABORATORY DATA/PROCEDURES: EKG today shows sinus rhythm. QT ok today at 464   Lab Results  Component Value Date   WBC 9.0 06/02/2013   HGB 11.2* 06/02/2013   HCT 34.2* 06/02/2013   PLT 243.0 06/02/2013   GLUCOSE 91 06/02/2013   ALT 19 06/02/2013   AST 23 06/02/2013   NA 136 06/02/2013   K 5.1 06/02/2013   CL 101 06/02/2013   CREATININE 0.8 06/02/2013   BUN 18 06/02/2013   CO2 29 06/02/2013   TSH 1.70 06/02/2013   INR 1.4 03/05/2008    BNP (last 3 results) No results for input(s): PROBNP in the last 8760 hours.   Assessment / Plan:  1. PAF - on amiodarone -  EKG today stable. No change in current regimen. Recheck surveillance labs today.   2. Advanced age  925. CAD - managed medically - no chest pain reported.   4. Valvular heart disease - no symptoms  5. Weight loss - worrisome that she may have some underlying cancer - she has not wished to have further testing -  She is up 5 pounds over the past 6 months.   I will see her in 6 months. No change in her current regimen.  Patient is agreeable to this plan and will call if any problems develop in the interim.   Rosalio MacadamiaLori C. Randall Rampersad, RN, ANP-C [facility] [address] [place], [place]  [id number] [phone]

## 2013-12-01 NOTE — Patient Instructions (Addendum)
We will be checking the following labs today BMET, lipids, TSH, CBC, HPF  Stay on your current medicines  I will see you in 6 months  Call the [facility] office at [phone] if you have any questions, problems or concerns.

## 2014-01-24 ENCOUNTER — Other Ambulatory Visit: Payer: Self-pay | Admitting: Nurse Practitioner

## 2014-02-19 ENCOUNTER — Other Ambulatory Visit: Payer: Self-pay | Admitting: Nurse Practitioner

## 2014-03-16 ENCOUNTER — Encounter: Payer: Self-pay | Admitting: Nurse Practitioner

## 2014-06-06 ENCOUNTER — Other Ambulatory Visit (HOSPITAL_COMMUNITY): Payer: Self-pay | Admitting: *Deleted

## 2014-06-07 ENCOUNTER — Encounter: Payer: Self-pay | Admitting: Nurse Practitioner

## 2014-06-07 ENCOUNTER — Ambulatory Visit (INDEPENDENT_AMBULATORY_CARE_PROVIDER_SITE_OTHER): Payer: Medicare Other | Admitting: Nurse Practitioner

## 2014-06-07 ENCOUNTER — Ambulatory Visit (HOSPITAL_COMMUNITY)
Admission: RE | Admit: 2014-06-07 | Discharge: 2014-06-07 | Disposition: A | Discharge status: Home / Self Care | Payer: Medicare Other | Source: Ambulatory Visit | Attending: Internal Medicine | Admitting: Internal Medicine

## 2014-06-07 VITALS — BP 160/60 | HR 71 | Ht 66.0 in | Wt 115.0 lb

## 2014-06-07 DIAGNOSIS — M810 Age-related osteoporosis without current pathological fracture: Secondary | ICD-10-CM | POA: Diagnosis present

## 2014-06-07 DIAGNOSIS — I259 Chronic ischemic heart disease, unspecified: Secondary | ICD-10-CM | POA: Diagnosis not present

## 2014-06-07 DIAGNOSIS — Z79899 Other long term (current) drug therapy: Secondary | ICD-10-CM

## 2014-06-07 DIAGNOSIS — I480 Paroxysmal atrial fibrillation: Secondary | ICD-10-CM

## 2014-06-07 MED ORDER — DENOSUMAB 60 MG/ML ~~LOC~~ SOLN
60.0000 mg | Freq: Once | SUBCUTANEOUS | Status: AC
  Administered 2014-06-07: 60 mg via SUBCUTANEOUS
  Filled 2014-06-07: qty 1

## 2014-06-07 NOTE — Patient Instructions (Addendum)
We will be checking the following labs today - NONE   Medication Instructions:    Continue with your current medicines.     Testing/Procedures To Be Arranged:  N/A  Follow-Up:   We will see you back in 6 months    Other Special Instructions:   N/A  Call the  [facility] office at [phone] if you have any questions, problems or concerns.      

## 2014-06-07 NOTE — Progress Notes (Signed)
CARDIOLOGY OFFICE NOTE  Date:  06/07/2014    [name] Date of Birth: 05/27/1920 Medical Record #[id number]  PCP:  Ezequiel KayserPERINI,MARK A, MD  Cardiologist:  Nahser  Chief Complaint  Patient presents with  . Atrial Fibrillation    6 month check - seen for Dr. Antoine PocheHochrein     History of Present Illness: [name] is a 81 y.o. female who presents today for a 6 month check. Seen for Dr. Antoine PocheHochrein. She is a former patient of Dr. Ronnald Nianennant's. She has a history of paroxysmal atrial fibrillation, flash pulmonary edema associated with rapid atrial fibrillation, and mild to moderate aortic insufficiency on 2-D echo in 2010. She had moderate coronary artery disease on cath in 2010 with 60% ostial stenosis of the RCA and 60-70% mid LAD with an EF of 45%. She has been managed medically. Other issues include PUD, IBS, and carotid disease. Sees Dr. Waynard EdwardsPerini for primary care.   Seen back in May of 2015 she had been noted to have positive hemoccult cards and did not wish to pursue any workup. Her dose of Diovan had been cut back due to low BP and dizziness. She was otherwise stable. Her EKG showed sinus but with prolonged QT at 502 and QTc at 513 - she was also on Celexa - I cut her the amiodarone back to 100 mg a day.   Last seen by me back in November and was doing well. Continued to live alone.   Comes back today. Here with her daughter. Was rushing around this morning. Already done several errands before coming here today. She is doing well. Still lives alone. Does her own laundry, cooking and cleaning. Goes up her stairs several times a day. No falls. Does have DOE but overall felt to be stable. No chest pain. Not dizzy. No palpitations. Has seen Dr. Waynard EdwardsPerini last month with labs - will not need to repeat here today.  Past Medical History  Diagnosis Date  . Atrial fibrillation with rapid ventricular response Feb 2010  . Hypertension   . Flash pulmonary edema Feb 2010    Due to atrial fib with RVR  .  Aortic insufficiency Feb 2010    Mild to moderate per echo with normal EF  . Arthritis   . Hyperlipidemia   . Dementia   . Vitamin D deficiency   . IBS (irritable bowel syndrome)   . PUD (peptic ulcer disease)   . Compression fracture   . Raynaud phenomenon   . CAD (coronary artery disease)     2010.  60% RCA, 60 - 70% LAD  . Carotid stenosis     40 - 59% bilateral    Past Surgical History  Procedure Laterality Date  . Cardiac catheterization  03/11/2008    EF 45%  . Appendectomy    . Transthoracic echocardiogram  03/06/2008    EF 55%     Medications: Current Outpatient Prescriptions  Medication Sig Dispense Refill  . amiodarone (PACERONE) 200 MG tablet TAKE 1/2 TABLET BY MOUTH EVERY DAY 30 tablet 6  . aspirin EC 81 MG tablet Take 81 mg by mouth daily.      [name] Kitchen. atorvastatin (LIPITOR) 20 MG tablet Take 20 mg by mouth daily.    . citalopram (CELEXA) 20 MG tablet Take 10 mg by mouth daily.     [name] Kitchen. denosumab (PROLIA) 60 MG/ML SOLN injection Inject 60 mg into the skin every 6 (six) months. Administer in upper arm, thigh, or abdomen    .  ENSURE (ENSURE) Take 237 mLs by mouth daily.    . nitroGLYCERIN (NITRODUR - DOSED IN MG/24 HR) 0.4 mg/hr patch REMOVE OLD PATCH AND APPLY NEW PATCH EVERY NIGHT 30 patch 4  . valsartan (DIOVAN) 160 MG tablet one half table every day    . Vitamin D, Ergocalciferol, (DRISDOL) 50000 UNITS CAPS Take 50,000 Units by mouth every 7 (seven) days.       No current facility-administered medications for this visit.    Allergies: Allergies  Allergen Reactions  . Aspirin     REACTION: Intolerance  . Zoledronic Acid     REACTION: Intolerance    Social History: The patient  reports that she has never smoked. She does not have any smokeless tobacco history on file. She reports that she does not drink alcohol or use illicit drugs.   Family History: The patient's family history includes Cancer in her mother; Heart attack in her father; Stroke in her mother.    Review of Systems: Please see the history of present illness.   Otherwise, the review of systems is positive for little unsteadiness but no falls. Hard of hearing. Easy bruising. Some fatigue.   All other systems are reviewed and negative.   Physical Exam: VS:  BP 160/60 mmHg  Pulse 71  Ht 5\' 6"  (1.676 m)  Wt 115 lb (52.164 kg)  BMI 18.57 kg/m2  SpO2 98% .  BMI Body mass index is 18.57 kg/(m^2).  Wt Readings from Last 3 Encounters:  06/07/14 115 lb (52.164 kg)  12/01/13 108 lb 6.4 oz (49.17 kg)  06/02/13 103 lb 6.4 oz (46.902 kg)    General: Pleasant. Elderly female who is in no acute distress. Remains thin but has gained some weight.  HEENT: Normal. Neck: Supple, no JVD, carotid bruits, or masses noted.  Cardiac: Regular rate and rhythm. No murmurs, rubs, or gallops. No edema.  Respiratory:  Lungs are clear to auscultation bilaterally with normal work of breathing.  GI: Soft and nontender.  MS: No deformity or atrophy. Gait and ROM intact. Skin: Warm and dry. Color is normal.  Neuro:  Strength and sensation are intact and no gross focal deficits noted.  Psych: Alert, appropriate and with normal affect.   LABORATORY DATA:  EKG:  EKG is not ordered today.  Lab Results  Component Value Date   WBC 7.7 12/01/2013   HGB 11.1* 12/01/2013   HCT 33.7* 12/01/2013   PLT 226.0 12/01/2013   GLUCOSE 87 12/01/2013   CHOL 139 12/01/2013   TRIG 160.0* 12/01/2013   HDL 39.30 12/01/2013   LDLCALC 68 12/01/2013   ALT 15 12/01/2013   AST 20 12/01/2013   NA 136 12/01/2013   K 4.5 12/01/2013   CL 101 12/01/2013   CREATININE 0.8 12/01/2013   BUN 16 12/01/2013   CO2 28 12/01/2013   TSH 1.70 12/01/2013   INR 1.4 03/05/2008    BNP (last 3 results) No results for input(s): BNP in the last 8760 hours.  ProBNP (last 3 results) No results for input(s): PROBNP in the last 8760 hours.   Other Studies Reviewed Today:   Assessment/Plan: 1. PAF - on amiodarone - No change in  current regimen. Recheck surveillance labs today.   2. Advanced age  273. CAD - managed medically - no chest pain reported.   4. Valvular heart disease - no symptoms  Current medicines are reviewed with the patient today.  The patient does not have concerns regarding medicines other than what has been noted  above.  The following changes have been made:  See above.  Labs/ tests ordered today include:   No orders of the defined types were placed in this encounter.     Disposition:   FU with me in 6 months.   Patient is agreeable to this plan and will call if any problems develop in the interim.   Signed: Rosalio Macadamia, RN, ANP-C 06/07/2014 9:19 AM  [facility] [address] [place], [place]  [address] Phone: [phone] Fax: [phone]

## 2014-06-16 ENCOUNTER — Encounter (HOSPITAL_COMMUNITY): Payer: Self-pay | Admitting: Radiology

## 2014-06-16 ENCOUNTER — Emergency Department (HOSPITAL_COMMUNITY): Payer: Medicare Other

## 2014-06-16 ENCOUNTER — Inpatient Hospital Stay (HOSPITAL_COMMUNITY)
Admission: EM | Admit: 2014-06-16 | Discharge: 2014-06-21 | DRG: 064 | Disposition: A | Discharge status: Rehabilitation Facility | Payer: Medicare Other | Attending: Neurology | Admitting: Neurology

## 2014-06-16 DIAGNOSIS — I25810 Atherosclerosis of coronary artery bypass graft(s) without angina pectoris: Secondary | ICD-10-CM | POA: Diagnosis not present

## 2014-06-16 DIAGNOSIS — I7300 Raynaud's syndrome without gangrene: Secondary | ICD-10-CM | POA: Diagnosis present

## 2014-06-16 DIAGNOSIS — Z823 Family history of stroke: Secondary | ICD-10-CM | POA: Diagnosis not present

## 2014-06-16 DIAGNOSIS — D62 Acute posthemorrhagic anemia: Secondary | ICD-10-CM | POA: Diagnosis not present

## 2014-06-16 DIAGNOSIS — I482 Chronic atrial fibrillation: Secondary | ICD-10-CM | POA: Diagnosis not present

## 2014-06-16 DIAGNOSIS — S066X0A Traumatic subarachnoid hemorrhage without loss of consciousness, initial encounter: Secondary | ICD-10-CM | POA: Diagnosis present

## 2014-06-16 DIAGNOSIS — I2510 Atherosclerotic heart disease of native coronary artery without angina pectoris: Secondary | ICD-10-CM | POA: Diagnosis present

## 2014-06-16 DIAGNOSIS — I618 Other nontraumatic intracerebral hemorrhage: Secondary | ICD-10-CM | POA: Diagnosis present

## 2014-06-16 DIAGNOSIS — I480 Paroxysmal atrial fibrillation: Secondary | ICD-10-CM | POA: Diagnosis present

## 2014-06-16 DIAGNOSIS — S065X0A Traumatic subdural hemorrhage without loss of consciousness, initial encounter: Secondary | ICD-10-CM | POA: Diagnosis present

## 2014-06-16 DIAGNOSIS — R29810 Facial weakness: Secondary | ICD-10-CM | POA: Diagnosis present

## 2014-06-16 DIAGNOSIS — Y92017 Garden or yard in single-family (private) house as the place of occurrence of the external cause: Secondary | ICD-10-CM

## 2014-06-16 DIAGNOSIS — Z8249 Family history of ischemic heart disease and other diseases of the circulatory system: Secondary | ICD-10-CM

## 2014-06-16 DIAGNOSIS — I351 Nonrheumatic aortic (valve) insufficiency: Secondary | ICD-10-CM | POA: Diagnosis present

## 2014-06-16 DIAGNOSIS — I6789 Other cerebrovascular disease: Secondary | ICD-10-CM | POA: Diagnosis not present

## 2014-06-16 DIAGNOSIS — H9190 Unspecified hearing loss, unspecified ear: Secondary | ICD-10-CM | POA: Diagnosis present

## 2014-06-16 DIAGNOSIS — W108XXA Fall (on) (from) other stairs and steps, initial encounter: Secondary | ICD-10-CM | POA: Diagnosis present

## 2014-06-16 DIAGNOSIS — E559 Vitamin D deficiency, unspecified: Secondary | ICD-10-CM | POA: Diagnosis present

## 2014-06-16 DIAGNOSIS — I10 Essential (primary) hypertension: Secondary | ICD-10-CM | POA: Diagnosis present

## 2014-06-16 DIAGNOSIS — F0390 Unspecified dementia without behavioral disturbance: Secondary | ICD-10-CM | POA: Diagnosis present

## 2014-06-16 DIAGNOSIS — I6200 Nontraumatic subdural hemorrhage, unspecified: Secondary | ICD-10-CM | POA: Diagnosis not present

## 2014-06-16 DIAGNOSIS — E785 Hyperlipidemia, unspecified: Secondary | ICD-10-CM | POA: Diagnosis not present

## 2014-06-16 DIAGNOSIS — S069X9A Unspecified intracranial injury with loss of consciousness of unspecified duration, initial encounter: Secondary | ICD-10-CM | POA: Insufficient documentation

## 2014-06-16 DIAGNOSIS — I619 Nontraumatic intracerebral hemorrhage, unspecified: Secondary | ICD-10-CM

## 2014-06-16 DIAGNOSIS — S06890D Other specified intracranial injury without loss of consciousness, subsequent encounter: Secondary | ICD-10-CM | POA: Diagnosis not present

## 2014-06-16 DIAGNOSIS — S065XAA Traumatic subdural hemorrhage with loss of consciousness status unknown, initial encounter: Secondary | ICD-10-CM | POA: Diagnosis present

## 2014-06-16 DIAGNOSIS — G8194 Hemiplegia, unspecified affecting left nondominant side: Secondary | ICD-10-CM | POA: Diagnosis present

## 2014-06-16 DIAGNOSIS — S0101XA Laceration without foreign body of scalp, initial encounter: Secondary | ICD-10-CM | POA: Diagnosis present

## 2014-06-16 DIAGNOSIS — I612 Nontraumatic intracerebral hemorrhage in hemisphere, unspecified: Secondary | ICD-10-CM | POA: Insufficient documentation

## 2014-06-16 DIAGNOSIS — I160 Hypertensive urgency: Secondary | ICD-10-CM | POA: Insufficient documentation

## 2014-06-16 DIAGNOSIS — I6523 Occlusion and stenosis of bilateral carotid arteries: Secondary | ICD-10-CM | POA: Diagnosis present

## 2014-06-16 DIAGNOSIS — S065X9A Traumatic subdural hemorrhage with loss of consciousness of unspecified duration, initial encounter: Secondary | ICD-10-CM | POA: Diagnosis present

## 2014-06-16 DIAGNOSIS — K589 Irritable bowel syndrome without diarrhea: Secondary | ICD-10-CM | POA: Diagnosis present

## 2014-06-16 DIAGNOSIS — Z0000 Encounter for general adult medical examination without abnormal findings: Secondary | ICD-10-CM

## 2014-06-16 DIAGNOSIS — S06890S Other specified intracranial injury without loss of consciousness, sequela: Secondary | ICD-10-CM | POA: Diagnosis not present

## 2014-06-16 DIAGNOSIS — S069X4S Unspecified intracranial injury with loss of consciousness of 6 hours to 24 hours, sequela: Secondary | ICD-10-CM | POA: Diagnosis not present

## 2014-06-16 DIAGNOSIS — S069XAA Unspecified intracranial injury with loss of consciousness status unknown, initial encounter: Secondary | ICD-10-CM | POA: Insufficient documentation

## 2014-06-16 LAB — CBC WITH DIFFERENTIAL/PLATELET
BASOS ABS: 0 10*3/uL (ref 0.0–0.1)
BASOS PCT: 0 % (ref 0–1)
EOS ABS: 0.1 10*3/uL (ref 0.0–0.7)
Eosinophils Relative: 1 % (ref 0–5)
HCT: 34.4 % — ABNORMAL LOW (ref 36.0–46.0)
Hemoglobin: 10.9 g/dL — ABNORMAL LOW (ref 12.0–15.0)
LYMPHS PCT: 39 % (ref 12–46)
Lymphs Abs: 3 10*3/uL (ref 0.7–4.0)
MCH: 29.7 pg (ref 26.0–34.0)
MCHC: 31.7 g/dL (ref 30.0–36.0)
MCV: 93.7 fL (ref 78.0–100.0)
MONO ABS: 0.5 10*3/uL (ref 0.1–1.0)
Monocytes Relative: 7 % (ref 3–12)
Neutro Abs: 4.1 10*3/uL (ref 1.7–7.7)
Neutrophils Relative %: 53 % (ref 43–77)
PLATELETS: 193 10*3/uL (ref 150–400)
RBC: 3.67 MIL/uL — ABNORMAL LOW (ref 3.87–5.11)
RDW: 13.9 % (ref 11.5–15.5)
WBC: 7.7 10*3/uL (ref 4.0–10.5)

## 2014-06-16 LAB — I-STAT CG4 LACTIC ACID, ED: Lactic Acid, Venous: 1.5 mmol/L (ref 0.5–2.0)

## 2014-06-16 LAB — URINALYSIS, ROUTINE W REFLEX MICROSCOPIC
BILIRUBIN URINE: NEGATIVE
Glucose, UA: NEGATIVE mg/dL
HGB URINE DIPSTICK: NEGATIVE
Ketones, ur: NEGATIVE mg/dL
Leukocytes, UA: NEGATIVE
NITRITE: NEGATIVE
Protein, ur: NEGATIVE mg/dL
Specific Gravity, Urine: 1.012 (ref 1.005–1.030)
UROBILINOGEN UA: 0.2 mg/dL (ref 0.0–1.0)
pH: 6.5 (ref 5.0–8.0)

## 2014-06-16 LAB — COMPREHENSIVE METABOLIC PANEL
ALT: 24 U/L (ref 14–54)
ANION GAP: 9 (ref 5–15)
AST: 43 U/L — ABNORMAL HIGH (ref 15–41)
Albumin: 3.7 g/dL (ref 3.5–5.0)
Alkaline Phosphatase: 77 U/L (ref 38–126)
BUN: 20 mg/dL (ref 6–20)
CO2: 25 mmol/L (ref 22–32)
Calcium: 8.8 mg/dL — ABNORMAL LOW (ref 8.9–10.3)
Chloride: 103 mmol/L (ref 101–111)
Creatinine, Ser: 0.9 mg/dL (ref 0.44–1.00)
GFR calc Af Amer: >60 mL/min (ref >60)
GFR calc non Af Amer: 54 mL/min — ABNORMAL LOW (ref >60)
GLUCOSE: 104 mg/dL — AB (ref 65–99)
Potassium: 5.2 mmol/L — ABNORMAL HIGH (ref 3.5–5.1)
Sodium: 137 mmol/L (ref 135–145)
TOTAL PROTEIN: 7.2 g/dL (ref 6.5–8.1)
Total Bilirubin: 0.9 mg/dL (ref 0.3–1.2)

## 2014-06-16 LAB — PROTIME-INR
INR: 1.18 (ref 0.00–1.49)
Prothrombin Time: 15.2 seconds (ref 11.6–15.2)

## 2014-06-16 MED ORDER — STROKE: EARLY STAGES OF RECOVERY BOOK
Freq: Once | Status: AC
  Administered 2014-06-17: 01:00:00
  Filled 2014-06-16: qty 1

## 2014-06-16 MED ORDER — SODIUM CHLORIDE 0.9 % IV SOLN
INTRAVENOUS | Status: DC
  Administered 2014-06-16: 23:00:00 via INTRAVENOUS

## 2014-06-16 MED ORDER — LIDOCAINE-EPINEPHRINE (PF) 2 %-1:200000 IJ SOLN
20.0000 mL | Freq: Once | INTRAMUSCULAR | Status: AC
  Administered 2014-06-16: 20 mL
  Filled 2014-06-16: qty 20

## 2014-06-16 MED ORDER — PANTOPRAZOLE SODIUM 40 MG IV SOLR
40.0000 mg | Freq: Every day | INTRAVENOUS | Status: DC
  Administered 2014-06-17: 40 mg via INTRAVENOUS
  Filled 2014-06-16 (×2): qty 40

## 2014-06-16 MED ORDER — ACETAMINOPHEN 650 MG RE SUPP: 650.0000 mg | RECTAL | Status: DC | PRN

## 2014-06-16 MED ORDER — FENTANYL CITRATE (PF) 100 MCG/2ML IJ SOLN
25.0000 ug | Freq: Once | INTRAMUSCULAR | Status: AC
Start: 2014-06-16 — End: 2014-06-16
  Administered 2014-06-16: 25 ug via INTRAVENOUS
  Filled 2014-06-16: qty 2

## 2014-06-16 MED ORDER — SENNOSIDES-DOCUSATE SODIUM 8.6-50 MG PO TABS
1.0000 | ORAL_TABLET | Freq: Two times a day (BID) | ORAL | Status: DC
  Administered 2014-06-17 – 2014-06-19 (×5): 1 via ORAL
  Filled 2014-06-16 (×8): qty 1

## 2014-06-16 MED ORDER — SODIUM CHLORIDE 0.9 % IV BOLUS (SEPSIS)
500.0000 mL | Freq: Once | INTRAVENOUS | Status: AC
  Administered 2014-06-16: 500 mL via INTRAVENOUS

## 2014-06-16 MED ORDER — NICARDIPINE HCL IN NACL 20-0.86 MG/200ML-% IV SOLN
3.0000 mg/h | Freq: Once | INTRAVENOUS | Status: AC
  Administered 2014-06-16: 3 mg/h via INTRAVENOUS
  Filled 2014-06-16: qty 200

## 2014-06-16 MED ORDER — ACETAMINOPHEN 325 MG PO TABS
650.0000 mg | ORAL_TABLET | ORAL | Status: DC | PRN
  Administered 2014-06-17: 650 mg via ORAL
  Filled 2014-06-16: qty 2

## 2014-06-16 MED ORDER — FENTANYL CITRATE (PF) 100 MCG/2ML IJ SOLN
25.0000 ug | Freq: Once | INTRAMUSCULAR | Status: AC
  Administered 2014-06-16: 25 ug via INTRAVENOUS
  Filled 2014-06-16: qty 2

## 2014-06-16 MED ORDER — NICARDIPINE HCL IN NACL 20-0.86 MG/200ML-% IV SOLN
3.0000 mg/h | INTRAVENOUS | Status: DC
Start: 2014-06-16 — End: 2014-06-18

## 2014-06-16 NOTE — ED Notes (Addendum)
Nitro patch to right forearm, unknown patch to left back

## 2014-06-16 NOTE — H&P (Signed)
Admission H&P    Chief Complaint: Acute fall with new onset left-sided weakness including facial droop.  HPI: [name] is an 81 y.o. female with a history of hypertension, hyperlipidemia, atrial fibrillation,and coronary artery disease, brought to the emergency room following a fall at home. Patient was going down a set of steps into her yard when she fell. There was no loss of consciousness. Posterior scalp laceration was noted. Family members also noted a left facial droop. ET scan of her head showed a small acute right thalamic infarction as well as small right frontal parasagittal subdural hematoma without mass effect. NIH stroke score was 2 for mild left facial weakness and mild coordination difficulty of left upper extremity. Blood pressure was noted to be markedly elevated. She was started on Cardene drip for BP management.  LSN: 5 PM on 06/16/2014 tPA Given: No: Acute ICH and SDH mRankin:  Past Medical History  Diagnosis Date  . Atrial fibrillation with rapid ventricular response Feb 2010  . Hypertension   . Flash pulmonary edema Feb 2010    Due to atrial fib with RVR  . Aortic insufficiency Feb 2010    Mild to moderate per echo with normal EF  . Arthritis   . Hyperlipidemia   . Dementia   . Vitamin D deficiency   . IBS (irritable bowel syndrome)   . PUD (peptic ulcer disease)   . Compression fracture   . Raynaud phenomenon   . CAD (coronary artery disease)     2010.  60% RCA, 60 - 70% LAD  . Carotid stenosis     40 - 59% bilateral    Past Surgical History  Procedure Laterality Date  . Cardiac catheterization  03/11/2008    EF 45%  . Appendectomy    . Transthoracic echocardiogram  03/06/2008    EF 55%    Family History  Problem Relation Age of Onset  . Stroke Mother   . Cancer Mother   . Heart attack Father    Social History:  reports that she has never smoked. She does not have any smokeless tobacco history on file. She reports that she does not drink alcohol  or use illicit drugs.  Allergies:  Allergies  Allergen Reactions  . Aspirin     REACTION: Intolerance  . Zoledronic Acid     REACTION: Intolerance    Medications: Patient's preadmission medications were reviewed by me.  ROS:  History obtained from patient and patient's daughter.  General ROS: negative for - chills, fatigue, fever, night sweats, weight gain or weight loss Psychological ROS: negative for - behavioral disorder, hallucinations, memory difficulties, mood swings or suicidal ideation Ophthalmic ROS: negative for - blurry vision, double vision, eye pain or loss of vision ENT ROS: negative for - epistaxis, nasal discharge, oral lesions, sore throat, tinnitus or vertigo Allergy and Immunology ROS: negative for - hives or itchy/watery eyes Hematological and Lymphatic ROS: negative for - bleeding problems, bruising or swollen lymph nodes Endocrine ROS: negative for - galactorrhea, hair pattern changes, polydipsia/polyuria or temperature intolerance Respiratory ROS: negative for - cough, hemoptysis, shortness of breath or wheezing Cardiovascular ROS: negative for - chest pain, dyspnea on exertion, edema or irregular heartbeat Gastrointestinal ROS: negative for - abdominal pain, diarrhea, hematemesis, nausea/vomiting or stool incontinence Genito-Urinary ROS: negative for - dysuria, hematuria, incontinence or urinary frequency/urgency Musculoskeletal ROS: negative for - joint swelling or muscular weakness Neurological ROS: as noted in HPI Dermatological ROS: negative for rash and skin lesion changes  Physical Examination: Blood pressure 205/63, pulse 79, temperature 98.7 F (37.1 C), resp. rate 18, SpO2 97 %.  HEENT-  Normocephalic, no lesions, without obvious abnormality.  Normal external eye and conjunctiva.  Normal TM's bilaterally.  Normal auditory canals and external ears. Normal external nose, mucus membranes and septum.  Normal pharynx. Neck supple with no masses, nodes,  nodules or enlargement. Cardiovascular - regular rate and rhythm, S1, S2 normal, no murmur, click, rub or gallop Lungs - chest clear, no wheezing, rales, normal symmetric air entry Abdomen - soft, non-tender; bowel sounds normal; no masses,  no organomegaly Extremities - no joint deformities, effusion, or inflammation, no edema and no skin discoloration  Neurologic Examination: Mental Status: Alert, oriented, thought content appropriate.  Speech fluent without evidence of aphasia. Able to follow commands without difficulty. Cranial Nerves: II-Visual fields were normal. III/IV/VI-Pupils were equal and reacted. Extraocular movements were full and conjugate.    V/VII-no facial numbness; mild left lower facial weakness. VIII-normal. X-normal speech and symmetrical palatal movement. XI: trapezius strength/neck flexion strength normal bilaterally XII-midline tongue extension with normal strength. Motor: 5/5 bilaterally with normal tone and bulk Sensory: Normal throughout. Deep Tendon Reflexes: 2+ and symmetric. Plantars: Flexor bilaterally Cerebellar: Mild coordination abnormality involving left upper extremity. Carotid auscultation: Normal  Results for orders placed or performed during the hospital encounter of 06/16/14 (from the past 48 hour(s))  CBC with Differential     Status: Abnormal   Collection Time: 06/16/14  6:02 PM  Result Value Ref Range   WBC 7.7 4.0 - 10.5 K/uL    Comment: WHITE COUNT CONFIRMED ON SMEAR   RBC 3.67 (L) 3.87 - 5.11 MIL/uL   Hemoglobin 10.9 (L) 12.0 - 15.0 g/dL   HCT 74.2 (L) 59.5 - 63.8 %   MCV 93.7 78.0 - 100.0 fL   MCH 29.7 26.0 - 34.0 pg   MCHC 31.7 30.0 - 36.0 g/dL   RDW 75.6 43.3 - 29.5 %   Platelets 193 150 - 400 K/uL   Neutrophils Relative % 53 43 - 77 %   Lymphocytes Relative 39 12 - 46 %   Monocytes Relative 7 3 - 12 %   Eosinophils Relative 1 0 - 5 %   Basophils Relative 0 0 - 1 %   Neutro Abs 4.1 1.7 - 7.7 K/uL   Lymphs Abs 3.0 0.7 - 4.0  K/uL   Monocytes Absolute 0.5 0.1 - 1.0 K/uL   Eosinophils Absolute 0.1 0.0 - 0.7 K/uL   Basophils Absolute 0.0 0.0 - 0.1 K/uL   WBC Morphology ATYPICAL LYMPHOCYTES     Comment: TOXIC GRANULATION  I-Stat CG4 Lactic Acid, ED     Status: None   Collection Time: 06/16/14  6:28 PM  Result Value Ref Range   Lactic Acid, Venous 1.50 0.5 - 2.0 mmol/L   Dg Elbow Complete Right  06/16/2014   CLINICAL DATA:  Status post fall. Bruising and swelling at the posterior right elbow. Initial encounter.  EXAM: RIGHT ELBOW - COMPLETE 3+ VIEW  COMPARISON:  None.  FINDINGS: There is no evidence of fracture or dislocation. The visualized joint spaces are preserved. No significant joint effusion is identified. Mild soft tissue swelling is noted overlying the olecranon. The lateral view is somewhat suboptimal due to limitations in positioning. A peripheral IV catheter is noted overlying the antecubital fossa.  IMPRESSION: No evidence of fracture or dislocation.   Electronically Signed   By: Roanna Raider M.D.   On: 06/16/2014 18:44   Ct  Head Wo Contrast  06/16/2014   CLINICAL DATA:  81 year old female found lying on concrete with laceration to the back of the head.  EXAM: CT HEAD WITHOUT CONTRAST  CT NECK WITHOUT CONTRAST  TECHNIQUE: Contiguous axial images were obtained from the base of the skull through the vertex without contrast. Multidetector CT imaging of the neck was performed using the standard protocol without intravenous contrast.  COMPARISON:  None.  FINDINGS: CT HEAD FINDINGS  There is a 1.3 x 1.1 x 1.0 cm hematoma in the lateral right thalamus region. There is surrounding vasogenic edema. There may be additional small hemorrhagic foci around the right thalamic hematoma. There are small densities along the left corona radiata that are suggestive for blood products or small contusions. There is a small right subdural hematoma along the anterior falx. In addition, there is extra-axial blood along the medial left  temporal lobe. Question a small focus of hemorrhage along the medial left cerebellum on sequence 2, image 7. There may be an old lacune infarct in left basal ganglia. There is no significant midline shift. There is extensive low density throughout the white matter suggesting chronic changes. No evidence for hydrocephalus. Paranasal sinuses are clear. No evidence for a calvarial fracture. Evidence for soft tissue injury or laceration along the posterior scalp.  CT NECK FINDINGS  There is extensive pleural-parenchymal scarring with calcifications at the lung apices. No evidence for pneumothorax. Multilevel degenerative changes in cervical spine. No evidence for an acute fracture. There are blood products along the base and medial aspect of the left temporal lobe which probably represents subdural blood. No significant soft tissue swelling in the neck. There is severe lordosis of the cervical spine. Multilevel facet disease. Vertebral body heights are maintained.  IMPRESSION: Positive for intracranial hemorrhage. Largest hemorrhagic focus is located in the right thalamus. There are additional parenchymal hemorrhages in the left white matter tract. The right thalamic hemorrhage may be the cause for the patient's falling and the other areas of hemorrhage could be posttraumatic. In addition, there is evidence for extra-axial blood along the anterior falx and along the left temporal lobe which is compatible with small subdural hematomas.  Atrophy and extensive white matter disease. No evidence for midline shift.  Multilevel degenerative changes in cervical spine without acute bone abnormality.  Critical Value/emergent results were called by telephone at the time of interpretation on 06/16/2014 at 7:09 pm to Dr. Radford PaxBeaton , who verbally acknowledged these results.   Electronically Signed   By: Richarda OverlieAdam  Henn M.D.   On: 06/16/2014 19:26   Ct Cervical Spine Wo Contrast  06/16/2014   CLINICAL DATA:  81 year old female found lying  on concrete with laceration to the back of the head.  EXAM: CT HEAD WITHOUT CONTRAST  CT NECK WITHOUT CONTRAST  TECHNIQUE: Contiguous axial images were obtained from the base of the skull through the vertex without contrast. Multidetector CT imaging of the neck was performed using the standard protocol without intravenous contrast.  COMPARISON:  None.  FINDINGS: CT HEAD FINDINGS  There is a 1.3 x 1.1 x 1.0 cm hematoma in the lateral right thalamus region. There is surrounding vasogenic edema. There may be additional small hemorrhagic foci around the right thalamic hematoma. There are small densities along the left corona radiata that are suggestive for blood products or small contusions. There is a small right subdural hematoma along the anterior falx. In addition, there is extra-axial blood along the medial left temporal lobe. Question a small focus of hemorrhage  along the medial left cerebellum on sequence 2, image 7. There may be an old lacune infarct in left basal ganglia. There is no significant midline shift. There is extensive low density throughout the white matter suggesting chronic changes. No evidence for hydrocephalus. Paranasal sinuses are clear. No evidence for a calvarial fracture. Evidence for soft tissue injury or laceration along the posterior scalp.  CT NECK FINDINGS  There is extensive pleural-parenchymal scarring with calcifications at the lung apices. No evidence for pneumothorax. Multilevel degenerative changes in cervical spine. No evidence for an acute fracture. There are blood products along the base and medial aspect of the left temporal lobe which probably represents subdural blood. No significant soft tissue swelling in the neck. There is severe lordosis of the cervical spine. Multilevel facet disease. Vertebral body heights are maintained.  IMPRESSION: Positive for intracranial hemorrhage. Largest hemorrhagic focus is located in the right thalamus. There are additional parenchymal  hemorrhages in the left white matter tract. The right thalamic hemorrhage may be the cause for the patient's falling and the other areas of hemorrhage could be posttraumatic. In addition, there is evidence for extra-axial blood along the anterior falx and along the left temporal lobe which is compatible with small subdural hematomas.  Atrophy and extensive white matter disease. No evidence for midline shift.  Multilevel degenerative changes in cervical spine without acute bone abnormality.  Critical Value/emergent results were called by telephone at the time of interpretation on 06/16/2014 at 7:09 pm to Dr. Radford PaxBeaton , who verbally acknowledged these results.   Electronically Signed   By: Richarda OverlieAdam  Henn M.D.   On: 06/16/2014 19:26   Dg Pelvis Portable  06/16/2014   CLINICAL DATA:  Post fall.  Possible stroke.  Initial encounter.  EXAM: PORTABLE PELVIS 1-2 VIEWS  COMPARISON:  None  FINDINGS: Evaluation of the left greater trochanter is degraded secondary to over penetration.  No definite displaced hip or pelvic fracture. Bilateral hip joint spaces appear preserved. Regional soft tissues appear normal. No radiopaque foreign body.  IMPRESSION: No definite displaced hip or pelvic fracture. Further evaluation dedicated radiographs of the hip of interest could be performed as clinically indicated.   Electronically Signed   By: Simonne ComeJohn  Watts M.D.   On: 06/16/2014 19:04   Dg Chest Portable 1 View  06/16/2014   CLINICAL DATA:  Fall, possible stroke, history hypertension, atrial fibrillation, coronary artery disease  EXAM: PORTABLE CHEST - 1 VIEW  COMPARISON:  Portable exam 1822 hours compared 10/04/2008  FINDINGS: Enlargement of cardiac silhouette.  Atherosclerotic calcification aorta.  Mediastinal contours and pulmonary vascularity normal.  Emphysematous changes with biapical scarring and pleural parenchymal calcifications.  No definite acute infiltrate, pleural effusion or pneumothorax.  Bones demineralized.  IMPRESSION: COPD  changes with biapical scarring and calcification.  Enlargement of cardiac silhouette.  No acute abnormalities or interval change.   Electronically Signed   By: Ulyses SouthwardMark  Boles M.D.   On: 06/16/2014 18:45    Assessment: 81 y.o. female with multiple risk factors for stroke presenting with acute right thalamic hemorrhage with associated fall and post head injury with small parasagittal frontal subdural hematoma.  Stroke Risk Factors - atrial fibrillation, carotid stenosis, hyperlipidemia and hypertension, family history  Plan: 1. HgbA1c, fasting lipid panel 2. MRI, MRA  of the brain without contrast 3. PT consult, OT consult, Speech consult 4. Echocardiogram 5. Carotid dopplers 6. Prophylactic therapy-None 7. Risk factor modification 8. Telemetry monitoring  This patient is critically ill and at significant risk of neurological worsening  or death, and care requires constant monitoring of vital signs, hemodynamics,respiratory and cardiac monitoring, neurological assessment, discussion with family, other specialists and medical decision making of high complexity. Total critical care time was 60 minutes.  C.R. Roseanne Reno, MD [facility] [phone]  06/16/2014, 7:48 PM

## 2014-06-16 NOTE — ED Notes (Signed)
Nitro patch removed from patient

## 2014-06-16 NOTE — ED Notes (Signed)
EMS-patient was last seen yesterday, today found outside laying on concrete. BP 260 en route. Patient with laceration to back of head noted. Only complaint at this time is pain to head. Pt is alert and oriented x 4. Patient was unsteady of feet and was falling to left side, not patients norm per daughter.

## 2014-06-16 NOTE — ED Notes (Signed)
Pt returned from ct.  No neuro changes noted.

## 2014-06-16 NOTE — Progress Notes (Signed)
eLink Physician-Brief Progress Note Patient Name: Mervyn Skeetersula W Seitzinger DOB: 07/27/1920 MRN: [id number]   Date of Service  06/16/2014  HPI/Events of Note  81 yo female with PMH AFIB, Carotid Stenosis and HTN. S/p fall today. Head CT Scan reveals small acute R thalamic infarction and small frontal parasagittal SDH without mass effect. Currently on a Nicardipine IV infusion for HTN control. BP = 151/67 and HR = 93. Sat = 96% and RR = 17. Management per Neurology.   eICU Interventions  Continue current management.      Intervention Category Evaluation Type: New Patient Evaluation  Lenell AntuSommer,Steven Eugene 06/16/2014, 11:26 PM

## 2014-06-16 NOTE — ED Notes (Signed)
Patient transported to CT 

## 2014-06-16 NOTE — ED Notes (Signed)
Neurologist at bedside. 

## 2014-06-16 NOTE — ED Provider Notes (Signed)
CSN: [id number]     Arrival date & time 06/16/14  1753 History   First MD Initiated Contact with Patient 06/16/14 1758     Chief Complaint  Patient presents with  . Fall  . Laceration     (Consider location/radiation/quality/duration/timing/severity/associated sxs/prior Treatment) Patient is a 80 y.o. female presenting with fall. The history is provided by the patient, a relative and the EMS personnel.  Fall This is a new problem. The current episode started today. The problem occurs constantly. The problem has been unchanged. Associated symptoms include headaches and weakness (L-sided weakness per daughter, now improved). Pertinent negatives include no abdominal pain, chest pain, chills, coughing, diaphoresis, fatigue, fever, nausea, neck pain, numbness, rash, vertigo, visual change or vomiting. Nothing aggravates the symptoms. She has tried nothing for the symptoms. The treatment provided no relief.    Past Medical History  Diagnosis Date  . Atrial fibrillation with rapid ventricular response Feb 2010  . Hypertension   . Flash pulmonary edema Feb 2010    Due to atrial fib with RVR  . Aortic insufficiency Feb 2010    Mild to moderate per echo with normal EF  . Arthritis   . Hyperlipidemia   . Dementia   . Vitamin D deficiency   . IBS (irritable bowel syndrome)   . PUD (peptic ulcer disease)   . Compression fracture   . Raynaud phenomenon   . CAD (coronary artery disease)     2010.  60% RCA, 60 - 70% LAD  . Carotid stenosis     40 - 59% bilateral   Past Surgical History  Procedure Laterality Date  . Cardiac catheterization  03/11/2008    EF 45%  . Appendectomy    . Transthoracic echocardiogram  03/06/2008    EF 55%   Family History  Problem Relation Age of Onset  . Stroke Mother   . Cancer Mother   . Heart attack Father    History  Substance Use Topics  . Smoking status: Never Smoker   . Smokeless tobacco: Not on file  . Alcohol Use: No   OB History    No data  available     Review of Systems  Constitutional: Negative for fever, chills, diaphoresis and fatigue.  Eyes: Negative for photophobia and visual disturbance.  Respiratory: Negative for cough, chest tightness and shortness of breath.   Cardiovascular: Negative for chest pain, palpitations and leg swelling.  Gastrointestinal: Negative for nausea, vomiting, abdominal pain, diarrhea and abdominal distention.  Genitourinary: Negative for dysuria and difficulty urinating.  Musculoskeletal: Negative for back pain, neck pain and neck stiffness.  Skin: Positive for wound (scalp laceration). Negative for color change, pallor and rash.  Neurological: Positive for facial asymmetry (L facial droop per daughter), weakness (L-sided weakness per daughter, now improved) and headaches. Negative for dizziness, vertigo, speech difficulty, light-headedness and numbness.  All other systems reviewed and are negative.     Allergies  Aspirin and Zoledronic acid  Home Medications   Prior to Admission medications   Medication Sig Start Date End Date Taking? Authorizing Provider  amiodarone (PACERONE) 200 MG tablet TAKE 1/2 TABLET BY MOUTH EVERY DAY 06/02/13  Yes Rosalio Macadamia, NP  aspirin EC 81 MG tablet Take 81 mg by mouth daily.     Yes Historical Provider, MD  atorvastatin (LIPITOR) 20 MG tablet Take 20 mg by mouth daily.   Yes Historical Provider, MD  citalopram (CELEXA) 20 MG tablet Take 20 mg by mouth daily.  Yes Historical Provider, MD  denosumab (PROLIA) 60 MG/ML SOLN injection Inject 60 mg into the skin every 6 (six) months. Administer in upper arm, thigh, or abdomen   Yes Historical Provider, MD  ibuprofen (ADVIL,MOTRIN) 200 MG tablet Take 400 mg by mouth every 6 (six) hours as needed for mild pain.   Yes Historical Provider, MD  nitroGLYCERIN (NITRODUR - DOSED IN MG/24 HR) 0.4 mg/hr patch REMOVE OLD PATCH AND APPLY NEW PATCH EVERY NIGHT 01/24/14  Yes Rosalio Macadamia, NP  valsartan (DIOVAN) 160 MG  tablet one table every day 09/19/11  Yes Rollene Rotunda, MD  Vitamin D, Ergocalciferol, (DRISDOL) 50000 UNITS CAPS Take 50,000 Units by mouth every 7 (seven) days.     Yes Historical Provider, MD   BP 143/41 mmHg  Pulse 71  Temp(Src) 98 F (36.7 C) (Oral)  Resp 20  Ht 5\' 6"  (1.676 m)  Wt 111 lb 8.8 oz (50.6 kg)  BMI 18.01 kg/m2  SpO2 93% Physical Exam  Constitutional: She is oriented to person, place, and time. She appears well-developed and well-nourished. No distress.  HENT:  Head: Normocephalic.  Mouth/Throat: Oropharynx is clear and moist.  2 cm laceration to posterior scalp, mild oozing controlled with pressure dressing  Eyes: Conjunctivae and EOM are normal. Pupils are equal, round, and reactive to light.  Neck:  c-collar in place  Cardiovascular: Normal rate, regular rhythm, normal heart sounds and intact distal pulses.  Exam reveals no gallop and no friction rub.   No murmur heard. Pulmonary/Chest: Effort normal and breath sounds normal. No respiratory distress. She has no wheezes. She has no rales. She exhibits no tenderness.  Abdominal: Soft. Bowel sounds are normal. She exhibits no distension. There is no tenderness. There is no rebound and no guarding.  Musculoskeletal: Normal range of motion. She exhibits no edema or tenderness.  Neurological: She is alert and oriented to person, place, and time. She has normal strength. No cranial nerve deficit or sensory deficit. She exhibits normal muscle tone. Coordination normal. GCS eye subscore is 4. GCS verbal subscore is 5. GCS motor subscore is 6.  Skin: Skin is warm and dry. No rash noted. She is not diaphoretic. No erythema. No pallor.  Nursing note and vitals reviewed.   ED Course  LACERATION REPAIR Date/Time: 06/16/2014 9:00 PM Performed by: Jodean Lima Authorized by: Nelva Nay Consent: Verbal consent obtained. Patient identity confirmed: arm band Body area: head/neck Location details: scalp Laceration length: 2  cm Foreign bodies: no foreign bodies Tendon involvement: none Nerve involvement: none Vascular damage: no Anesthesia: local infiltration Local anesthetic: lidocaine 1% with epinephrine Anesthetic total: 5 ml Patient sedated: no Preparation: Patient was prepped and draped in the usual sterile fashion. Irrigation solution: saline Irrigation method: syringe Amount of cleaning: standard Debridement: none Degree of undermining: none Skin closure: staples Number of sutures: 4 Technique: simple Approximation: close Approximation difficulty: simple Patient tolerance: Patient tolerated the procedure well with no immediate complications   (including critical care time) Labs Review Labs Reviewed  COMPREHENSIVE METABOLIC PANEL - Abnormal; Notable for the following:    Potassium 5.2 (*)    Glucose, Bld 104 (*)    Calcium 8.8 (*)    AST 43 (*)    GFR calc non Af Amer 54 (*)    All other components within normal limits  CBC WITH DIFFERENTIAL/PLATELET - Abnormal; Notable for the following:    RBC 3.67 (*)    Hemoglobin 10.9 (*)    HCT 34.4 (*)  All other components within normal limits  CBC - Abnormal; Notable for the following:    WBC 10.6 (*)    RBC 3.07 (*)    Hemoglobin 9.2 (*)    HCT 28.4 (*)    All other components within normal limits  BASIC METABOLIC PANEL - Abnormal; Notable for the following:    Sodium 133 (*)    Glucose, Bld 118 (*)    Calcium 7.8 (*)    All other components within normal limits  LIPID PANEL - Abnormal; Notable for the following:    HDL 37 (*)    All other components within normal limits  HEMOGLOBIN A1C - Abnormal; Notable for the following:    Hgb A1c MFr Bld 5.9 (*)    All other components within normal limits  CBC - Abnormal; Notable for the following:    RBC 3.10 (*)    Hemoglobin 9.2 (*)    HCT 29.1 (*)    All other components within normal limits  BASIC METABOLIC PANEL - Abnormal; Notable for the following:    Glucose, Bld 102 (*)     Calcium 8.2 (*)    All other components within normal limits  MRSA PCR SCREENING  URINALYSIS, ROUTINE W REFLEX MICROSCOPIC  PROTIME-INR  TSH  I-STAT CG4 LACTIC ACID, ED    Imaging Review Dg Elbow Complete Right  06/16/2014   CLINICAL DATA:  Status post fall. Bruising and swelling at the posterior right elbow. Initial encounter.  EXAM: RIGHT ELBOW - COMPLETE 3+ VIEW  COMPARISON:  None.  FINDINGS: There is no evidence of fracture or dislocation. The visualized joint spaces are preserved. No significant joint effusion is identified. Mild soft tissue swelling is noted overlying the olecranon. The lateral view is somewhat suboptimal due to limitations in positioning. A peripheral IV catheter is noted overlying the antecubital fossa.  IMPRESSION: No evidence of fracture or dislocation.   Electronically Signed   By: Roanna RaiderJeffery  Chang M.D.   On: 06/16/2014 18:44   Ct Head Wo Contrast  06/18/2014   CLINICAL DATA:  Bold twenty-four followup intracranial hemorrhage.  EXAM: CT HEAD WITHOUT CONTRAST  TECHNIQUE: Contiguous axial images were obtained from the base of the skull through the vertex without intravenous contrast.  COMPARISON:  CT head Jun 17, 2014  FINDINGS: 15 x 10 mm evolving RIGHT thalamic intraparenchymal hematoma with surrounding hypodense vasogenic edema relatively unchanged. Local mass effect without midline shift. Subcentimeter LEFT corona radiata hematoma. Trace subdural hematoma in LEFT middle cranial fossa, stable. Minimal subarachnoid blood in LEFT sylvian fissure. Stable RIGHT parafalcine 5 mm subdural hematoma. Trace subdural hematoma all along the posterior falx.  Ventricles and sulci are normal for patient's age. Patchy to confluent supratentorial white matter hypodensities are unchanged most consistent with chronic small vessel ischemic disease. No acute large vascular territory infarct.  Basal cisterns are patent. Status post bilateral ocular lens implants. Moderate RIGHT parietal scalp  hematoma with overlying skin staples. No skull fracture. The patient is edentulous. Trace paranasal sinus mucosal thickening. The mastoid air cells are well aerated.  IMPRESSION: Evolving intraparenchymal hematomas, largest in RIGHT thalamus measuring 15 x 10 mm. Stable multifocal subdural hematomas measuring up to 5 mm along the RIGHT falx. Trace subarachnoid blood in LEFT sylvian fissure.  Involutional changes. Moderate white matter changes most consistent chronic small vessel ischemic disease.   Electronically Signed   By: Awilda Metroourtnay  Bloomer   On: 06/18/2014 04:53   Ct Head Wo Contrast  06/17/2014   CLINICAL DATA:  Recent fall yesterday, followup intracranial hemorrhage  EXAM: CT HEAD WITHOUT CONTRAST  TECHNIQUE: Contiguous axial images were obtained from the base of the skull through the vertex without intravenous contrast.  COMPARISON:  06/16/2014  FINDINGS: The bony calvarium is again intact. Surgical sutures are noted in the scalp wound posteriorly on the right.  A small area of parenchymal hemorrhage within the left cerebellar hemisphere as well as within the left centrum semi ovale are again seen and stable.  Previously seen parafalcine hematoma on the right is again identified and has increased slightly in the interval from the prior exam. It now measures 2.9 cm in greatest length increased from 1.6 cm on the prior exam. The overall width of the hematoma at 6 mm is stable when compared with the prior exam. The right thalamic hemorrhage with surrounding white matter edema is again identified. The focus of hemorrhage measures 15 mm which is slightly increased from 13 mm on the prior exam. A small subdural hematoma along the left temporal lobe medially is again identified and stable. No new focal infarct or hemorrhage is identified.  IMPRESSION: Persistent changes as described above. There is been slight increase in the size of right thalamic and right parafalcine hematomas as described above.    Electronically Signed   By: Alcide Clever M.D.   On: 06/17/2014 08:13   Ct Head Wo Contrast  06/16/2014   CLINICAL DATA:  81 year old female found lying on concrete with laceration to the back of the head.  EXAM: CT HEAD WITHOUT CONTRAST  CT NECK WITHOUT CONTRAST  TECHNIQUE: Contiguous axial images were obtained from the base of the skull through the vertex without contrast. Multidetector CT imaging of the neck was performed using the standard protocol without intravenous contrast.  COMPARISON:  None.  FINDINGS: CT HEAD FINDINGS  There is a 1.3 x 1.1 x 1.0 cm hematoma in the lateral right thalamus region. There is surrounding vasogenic edema. There may be additional small hemorrhagic foci around the right thalamic hematoma. There are small densities along the left corona radiata that are suggestive for blood products or small contusions. There is a small right subdural hematoma along the anterior falx. In addition, there is extra-axial blood along the medial left temporal lobe. Question a small focus of hemorrhage along the medial left cerebellum on sequence 2, image 7. There may be an old lacune infarct in left basal ganglia. There is no significant midline shift. There is extensive low density throughout the white matter suggesting chronic changes. No evidence for hydrocephalus. Paranasal sinuses are clear. No evidence for a calvarial fracture. Evidence for soft tissue injury or laceration along the posterior scalp.  CT NECK FINDINGS  There is extensive pleural-parenchymal scarring with calcifications at the lung apices. No evidence for pneumothorax. Multilevel degenerative changes in cervical spine. No evidence for an acute fracture. There are blood products along the base and medial aspect of the left temporal lobe which probably represents subdural blood. No significant soft tissue swelling in the neck. There is severe lordosis of the cervical spine. Multilevel facet disease. Vertebral body heights are  maintained.  IMPRESSION: Positive for intracranial hemorrhage. Largest hemorrhagic focus is located in the right thalamus. There are additional parenchymal hemorrhages in the left white matter tract. The right thalamic hemorrhage may be the cause for the patient's falling and the other areas of hemorrhage could be posttraumatic. In addition, there is evidence for extra-axial blood along the anterior falx and along the left temporal lobe which is  compatible with small subdural hematomas.  Atrophy and extensive white matter disease. No evidence for midline shift.  Multilevel degenerative changes in cervical spine without acute bone abnormality.  Critical Value/emergent results were called by telephone at the time of interpretation on 06/16/2014 at 7:09 pm to Dr. Radford Pax , who verbally acknowledged these results.   Electronically Signed   By: Richarda Overlie M.D.   On: 06/16/2014 19:26   Ct Cervical Spine Wo Contrast  06/16/2014   CLINICAL DATA:  81 year old female found lying on concrete with laceration to the back of the head.  EXAM: CT HEAD WITHOUT CONTRAST  CT NECK WITHOUT CONTRAST  TECHNIQUE: Contiguous axial images were obtained from the base of the skull through the vertex without contrast. Multidetector CT imaging of the neck was performed using the standard protocol without intravenous contrast.  COMPARISON:  None.  FINDINGS: CT HEAD FINDINGS  There is a 1.3 x 1.1 x 1.0 cm hematoma in the lateral right thalamus region. There is surrounding vasogenic edema. There may be additional small hemorrhagic foci around the right thalamic hematoma. There are small densities along the left corona radiata that are suggestive for blood products or small contusions. There is a small right subdural hematoma along the anterior falx. In addition, there is extra-axial blood along the medial left temporal lobe. Question a small focus of hemorrhage along the medial left cerebellum on sequence 2, image 7. There may be an old lacune  infarct in left basal ganglia. There is no significant midline shift. There is extensive low density throughout the white matter suggesting chronic changes. No evidence for hydrocephalus. Paranasal sinuses are clear. No evidence for a calvarial fracture. Evidence for soft tissue injury or laceration along the posterior scalp.  CT NECK FINDINGS  There is extensive pleural-parenchymal scarring with calcifications at the lung apices. No evidence for pneumothorax. Multilevel degenerative changes in cervical spine. No evidence for an acute fracture. There are blood products along the base and medial aspect of the left temporal lobe which probably represents subdural blood. No significant soft tissue swelling in the neck. There is severe lordosis of the cervical spine. Multilevel facet disease. Vertebral body heights are maintained.  IMPRESSION: Positive for intracranial hemorrhage. Largest hemorrhagic focus is located in the right thalamus. There are additional parenchymal hemorrhages in the left white matter tract. The right thalamic hemorrhage may be the cause for the patient's falling and the other areas of hemorrhage could be posttraumatic. In addition, there is evidence for extra-axial blood along the anterior falx and along the left temporal lobe which is compatible with small subdural hematomas.  Atrophy and extensive white matter disease. No evidence for midline shift.  Multilevel degenerative changes in cervical spine without acute bone abnormality.  Critical Value/emergent results were called by telephone at the time of interpretation on 06/16/2014 at 7:09 pm to Dr. Radford Pax , who verbally acknowledged these results.   Electronically Signed   By: Richarda Overlie M.D.   On: 06/16/2014 19:26   Dg Pelvis Portable  06/16/2014   CLINICAL DATA:  Post fall.  Possible stroke.  Initial encounter.  EXAM: PORTABLE PELVIS 1-2 VIEWS  COMPARISON:  None  FINDINGS: Evaluation of the left greater trochanter is degraded secondary to  over penetration.  No definite displaced hip or pelvic fracture. Bilateral hip joint spaces appear preserved. Regional soft tissues appear normal. No radiopaque foreign body.  IMPRESSION: No definite displaced hip or pelvic fracture. Further evaluation dedicated radiographs of the hip of interest could be  performed as clinically indicated.   Electronically Signed   By: Simonne Come M.D.   On: 06/16/2014 19:04   Chest Port 1 View  06/17/2014   CLINICAL DATA:  Routine physical examination; history of previous subdural hemorrhage ; emphysema, atrial fibrillation, and pulmonary edema.  EXAM: PORTABLE CHEST - 1 VIEW  COMPARISON:  Portable chest x-ray of Jun 16, 2014  FINDINGS: The lungs remain hyperinflated. Biapical scarring is unchanged. There is no alveolar infiltrate or pleural effusion. The cardiac silhouette is mildly enlarged but stable. The pulmonary vascularity is normal. The bony thorax exhibits no acute abnormality.  IMPRESSION: COPD with biapical parenchymal scarring likely secondary to previous granulomatous infection. There is no acute cardiopulmonary abnormality.   Electronically Signed   By: David  [place] M.D.   On: 06/17/2014 07:55   Dg Chest Portable 1 View  06/16/2014   CLINICAL DATA:  Fall, possible stroke, history hypertension, atrial fibrillation, coronary artery disease  EXAM: PORTABLE CHEST - 1 VIEW  COMPARISON:  Portable exam 1822 hours compared 10/04/2008  FINDINGS: Enlargement of cardiac silhouette.  Atherosclerotic calcification aorta.  Mediastinal contours and pulmonary vascularity normal.  Emphysematous changes with biapical scarring and pleural parenchymal calcifications.  No definite acute infiltrate, pleural effusion or pneumothorax.  Bones demineralized.  IMPRESSION: COPD changes with biapical scarring and calcification.  Enlargement of cardiac silhouette.  No acute abnormalities or interval change.   Electronically Signed   By: Ulyses Southward M.D.   On: 06/16/2014 18:45     EKG  Interpretation   Date/Time:  Thursday Jun 16 2014 18:32:40 EDT Ventricular Rate:  80 PR Interval:  197 QRS Duration: 87 QT Interval:  437 QTC Calculation: 504 R Axis:   50 Text Interpretation:  Sinus rhythm Consider left ventricular hypertrophy  Nonspecific T abnormalities, lateral leads Prolonged QT interval Confirmed  by BEATON  MD, ROBERT ([id number]) on 06/16/2014 6:36:09 PM      MDM   Final diagnoses:  Routine adult health maintenance  ICH (intracerebral hemorrhage)    81 yo F with PMH of CAD, HTN, Afib not anticoagulated, COPD, presenting s/p fall.  Pt reportedly found at the bottom of 4 steps by daughter.  Was last seen by family yesterday.  EMS called, pt found to have laceration to back of head.  SBP initially 260.  Pt alert, oriented.  Per family member, she spoke with pt this morning and said she sounded fine.  Spoke to pt again around 5 pm as she was about to pick her up from house and pt sounded normal at that time as well.  Family member arrived at house at about 5:10 pm and found pt on floor- she estimates pt fell around 5 pm.  She states pt initially had slurred speech with L facial droop however this has now resolved and pt back to mental status baseline.  On my eval, pt alert, oriented, in NAD. Recalls falling, states she may have lost her balance and fell backwards but unsure exactly how she fell. Denies preceding HA, dizziness, chest pain. Reports mild pain to back of head.  2 cm laceration to posterior scalp with mild oozing.  No C-, T-, or L-spine TTP.  Bruising to R elbow.  Pelvis stable, non-tender.  Neuro intact.  No other acute findings.  Plan for CT head, c-spine, CXR and XR pelvis given significant fall in elderly female.  Unclear onset of symptoms, neuro deficits reported by family now improved, and associated trauma- will obtain stat CT head but code  stroke not indicated.. Unknown downtime or mechanism of fall and pt significantly hypertensive- EKG, labs  ordered.  CT head shows R thalamic hemorrhage which appears spontaneous and may have caused fall, as well as L SDH which is suspected to be 2/2 trauma and fall.  Neuro consulted. Nicardipene drip ordered for HTN.  Laceration repaired as above.  Pt admitted to Neurology service for further management.  No other acute events during my care.  Discussed with attending Dr. Radford PaxBeaton.    Jodean LimaEmily Dwane Andres, MD 06/18/14 1534  Nelva Nayobert Beaton, MD 06/24/14 [phone]

## 2014-06-16 NOTE — ED Notes (Signed)
BP down to 126/49, family stated it's below "target" BP. Nurse notified.

## 2014-06-17 ENCOUNTER — Inpatient Hospital Stay (HOSPITAL_COMMUNITY): Payer: Medicare Other

## 2014-06-17 ENCOUNTER — Encounter (HOSPITAL_COMMUNITY): Payer: Self-pay | Admitting: *Deleted

## 2014-06-17 ENCOUNTER — Ambulatory Visit (HOSPITAL_COMMUNITY): Payer: Medicare Other

## 2014-06-17 DIAGNOSIS — I612 Nontraumatic intracerebral hemorrhage in hemisphere, unspecified: Secondary | ICD-10-CM

## 2014-06-17 DIAGNOSIS — I10 Essential (primary) hypertension: Secondary | ICD-10-CM

## 2014-06-17 DIAGNOSIS — I480 Paroxysmal atrial fibrillation: Secondary | ICD-10-CM

## 2014-06-17 DIAGNOSIS — I6200 Nontraumatic subdural hemorrhage, unspecified: Secondary | ICD-10-CM

## 2014-06-17 DIAGNOSIS — I6789 Other cerebrovascular disease: Secondary | ICD-10-CM

## 2014-06-17 DIAGNOSIS — E785 Hyperlipidemia, unspecified: Secondary | ICD-10-CM

## 2014-06-17 LAB — LIPID PANEL
Cholesterol: 118 mg/dL (ref 0–200)
HDL: 37 mg/dL — AB (ref >40)
LDL CALC: 62 mg/dL (ref 0–99)
Total CHOL/HDL Ratio: 3.2 RATIO
Triglycerides: 96 mg/dL (ref <150)
VLDL: 19 mg/dL (ref 0–40)

## 2014-06-17 LAB — BASIC METABOLIC PANEL
Anion gap: 7 (ref 5–15)
BUN: 15 mg/dL (ref 6–20)
CALCIUM: 7.8 mg/dL — AB (ref 8.9–10.3)
CO2: 23 mmol/L (ref 22–32)
CREATININE: 0.61 mg/dL (ref 0.44–1.00)
Chloride: 103 mmol/L (ref 101–111)
GFR calc Af Amer: >60 mL/min (ref >60)
GFR calc non Af Amer: >60 mL/min (ref >60)
Glucose, Bld: 118 mg/dL — ABNORMAL HIGH (ref 65–99)
Potassium: 3.9 mmol/L (ref 3.5–5.1)
Sodium: 133 mmol/L — ABNORMAL LOW (ref 135–145)

## 2014-06-17 LAB — CBC
HEMATOCRIT: 28.4 % — AB (ref 36.0–46.0)
HEMOGLOBIN: 9.2 g/dL — AB (ref 12.0–15.0)
MCH: 30 pg (ref 26.0–34.0)
MCHC: 32.4 g/dL (ref 30.0–36.0)
MCV: 92.5 fL (ref 78.0–100.0)
Platelets: 181 10*3/uL (ref 150–400)
RBC: 3.07 MIL/uL — ABNORMAL LOW (ref 3.87–5.11)
RDW: 13.9 % (ref 11.5–15.5)
WBC: 10.6 10*3/uL — ABNORMAL HIGH (ref 4.0–10.5)

## 2014-06-17 LAB — TSH: TSH: 1.31 u[IU]/mL (ref 0.350–4.500)

## 2014-06-17 LAB — MRSA PCR SCREENING: MRSA by PCR: NEGATIVE

## 2014-06-17 MED ORDER — ONDANSETRON HCL 4 MG/2ML IJ SOLN
INTRAMUSCULAR | Status: AC
  Administered 2014-06-17: 4 mg via INTRAVENOUS
  Filled 2014-06-17: qty 2

## 2014-06-17 MED ORDER — PANTOPRAZOLE SODIUM 40 MG PO TBEC
40.0000 mg | DELAYED_RELEASE_TABLET | Freq: Every day | ORAL | Status: DC
  Administered 2014-06-17 – 2014-06-21 (×5): 40 mg via ORAL
  Filled 2014-06-17 (×5): qty 1

## 2014-06-17 MED ORDER — ONDANSETRON HCL 4 MG/2ML IJ SOLN
4.0000 mg | Freq: Four times a day (QID) | INTRAMUSCULAR | Status: DC | PRN
  Administered 2014-06-17: 4 mg via INTRAVENOUS

## 2014-06-17 MED ORDER — IRBESARTAN 150 MG PO TABS
150.0000 mg | ORAL_TABLET | Freq: Every day | ORAL | Status: DC
  Administered 2014-06-17 – 2014-06-20 (×4): 150 mg via ORAL
  Filled 2014-06-17 (×4): qty 1

## 2014-06-17 MED ORDER — AMIODARONE HCL 200 MG PO TABS
100.0000 mg | ORAL_TABLET | Freq: Every day | ORAL | Status: DC
  Administered 2014-06-17 – 2014-06-21 (×5): 100 mg via ORAL
  Filled 2014-06-17 (×5): qty 1

## 2014-06-17 MED ORDER — CITALOPRAM HYDROBROMIDE 10 MG PO TABS
20.0000 mg | ORAL_TABLET | Freq: Every day | ORAL | Status: DC
  Administered 2014-06-17 – 2014-06-21 (×5): 20 mg via ORAL
  Filled 2014-06-17 (×3): qty 2
  Filled 2014-06-17 (×2): qty 1

## 2014-06-17 MED ORDER — ONDANSETRON HCL 4 MG/2ML IJ SOLN: 4.0000 mg | Freq: Four times a day (QID) | INTRAMUSCULAR | Status: DC

## 2014-06-17 NOTE — Progress Notes (Signed)
STROKE TEAM PROGRESS NOTE   SUBJECTIVE (INTERVAL HISTORY) Her daughter and granddaughter in law are at the bedside.  Overall her condition is stable. She still has mild left hemiparesis but mentally intact.    OBJECTIVE Temp:  [98.2 F (36.8 C)-99.1 F (37.3 C)] 99.1 F (37.3 C) (05/20 1548) Pulse Rate:  [67-109] 77 (05/20 1400) Cardiac Rhythm:  [-] Normal sinus rhythm (05/20 0800) Resp:  [11-25] 17 (05/20 1400) BP: (104-227)/(43-91) 112/49 mmHg (05/20 1400) SpO2:  [90 %-100 %] 94 % (05/20 1400) Weight:  [111 lb 8.8 oz (50.6 kg)] 111 lb 8.8 oz (50.6 kg) (05/19 2246)  No results for input(s): GLUCAP in the last 168 hours.  Recent Labs Lab 06/16/14 1802 06/17/14 0828  NA 137 133*  K 5.2* 3.9  CL 103 103  CO2 25 23  GLUCOSE 104* 118*  BUN 20 15  CREATININE 0.90 0.61  CALCIUM 8.8* 7.8*    Recent Labs Lab 06/16/14 1802  AST 43*  ALT 24  ALKPHOS 77  BILITOT 0.9  PROT 7.2  ALBUMIN 3.7    Recent Labs Lab 06/16/14 1802 06/17/14 0828  WBC 7.7 10.6*  NEUTROABS 4.1  --   HGB 10.9* 9.2*  HCT 34.4* 28.4*  MCV 93.7 92.5  PLT 193 181   No results for input(s): CKTOTAL, CKMB, CKMBINDEX, TROPONINI in the last 168 hours.  Recent Labs  06/16/14 1924  LABPROT 15.2  INR 1.18    Recent Labs  06/16/14 2000  COLORURINE YELLOW  LABSPEC 1.012  PHURINE 6.5  GLUCOSEU NEGATIVE  HGBUR NEGATIVE  BILIRUBINUR NEGATIVE  KETONESUR NEGATIVE  PROTEINUR NEGATIVE  UROBILINOGEN 0.2  NITRITE NEGATIVE  LEUKOCYTESUR NEGATIVE       Component Value Date/Time   CHOL 118 06/17/2014 0828   TRIG 96 06/17/2014 0828   HDL 37* 06/17/2014 0828   CHOLHDL 3.2 06/17/2014 0828   VLDL 19 06/17/2014 0828   LDLCALC 62 06/17/2014 0828   No results found for: HGBA1C No results found for: LABOPIA, COCAINSCRNUR, LABBENZ, AMPHETMU, THCU, LABBARB  No results for input(s): ETH in the last 168 hours.  I have personally reviewed the radiological images below and agree with the radiology  interpretations.  Ct Head Wo Contrast  06/17/2014   IMPRESSION: Persistent changes as described above. There is been slight increase in the size of right thalamic and right parafalcine hematomas as described above.   06/16/2014   IMPRESSION: Positive for intracranial hemorrhage. Largest hemorrhagic focus is located in the right thalamus. There are additional parenchymal hemorrhages in the left white matter tract. The right thalamic hemorrhage may be the cause for the patient's falling and the other areas of hemorrhage could be posttraumatic. In addition, there is evidence for extra-axial blood along the anterior falx and along the left temporal lobe which is compatible with small subdural hematomas.  Atrophy and extensive white matter disease. No evidence for midline shift.  Multilevel degenerative changes in cervical spine without acute bone abnormality.    Chest Port 1 View  06/17/2014   IMPRESSION: COPD with biapical parenchymal scarring likely secondary to previous granulomatous infection. There is no acute cardiopulmonary abnormality.   06/16/2014   IMPRESSION: COPD changes with biapical scarring and calcification.  Enlargement of cardiac silhouette.  No acute abnormalities or interval change.   2D Echocardiogram  - Left ventricle: The cavity size was normal. Wall thickness was normal. Systolic function was normal. The estimated ejection fraction was in the range of 55% to 60%. Doppler parameters are consistent with  abnormal left ventricular relaxation (grade 1 diastolic dysfunction). - Aortic valve: Severely calcified non coronary cusp. There was mild stenosis. There was mild to moderate regurgitation. Valve area (VTI): 1.56 cm^2. Valve area (Vmax): 1.26 cm^2. Valve area (Vmean): 1.39 cm^2. - Mitral valve: Calcified annulus. Moderately thickened leaflets . - Left atrium: The atrium was moderately dilated. - Atrial septum: No defect or patent foramen ovale was  identified.  EKG  normal sinus rhythm. For complete results please see formal report.  PHYSICAL EXAM  Temp:  [98.2 F (36.8 C)-99.1 F (37.3 C)] 99.1 F (37.3 C) (05/20 1548) Pulse Rate:  [67-109] 77 (05/20 1400) Resp:  [11-25] 17 (05/20 1400) BP: (104-227)/(43-91) 112/49 mmHg (05/20 1400) SpO2:  [90 %-100 %] 94 % (05/20 1400) Weight:  [111 lb 8.8 oz (50.6 kg)] 111 lb 8.8 oz (50.6 kg) (05/19 2246)  General - Well nourished, well developed, in no apparent distress.  Ophthalmologic - fundi not visualized due to noncooperation.  Cardiovascular - Regular rate and rhythm.  Neck - supple, no carotid bruits  Mental Status -  Level of arousal and orientation to time, place, and person were intact. Language including expression, naming, repetition, comprehension was assessed and found intact, but paucity of speech. Attention span and concentration were normal, able to calculate.  Cranial Nerves II - XII - II - Visual field intact OU. III, IV, VI - Extraocular movements intact. V - Facial sensation intact bilaterally. VII - left mild nasolabial fold flattening. VIII - Hard of hearing & vestibular intact bilaterally. X - Palate elevates symmetrically. XI - Chin turning & shoulder shrug intact bilaterally. XII - Tongue protrusion intact.  Motor Strength - The patient's strength was 4/5 LUE and LLE and pronator drift was present on the left.  Bulk was normal and fasciculations were absent.   Motor Tone - Muscle tone was assessed at the neck and appendages and was normal.  Reflexes - The patient's reflexes were symmetrical in all extremities and she had bilateral positive Babinski.  Sensory - Light touch, temperature/pinprick were assessed and were symmetrical.    Coordination - The patient had normal movements in the right hand with no ataxia or dysmetria.  Tremor was absent.  Gait and Station - deferred due to safety concerns.   ASSESSMENT/PLAN [name] is a 81 y.o.  female with history of hypertension, hyperlipidemia, [facility], CAD admitted for ICH and SDH after fall. Symptoms stable.    Bilateral BG ICH with parafalcine SDH - SDH likely traumatic due to fall  CT head repeat showed slightly increased the size of SDH - will repeat CT in a.m. - if keep increasing signs of bleeding, may consider platelet transfusion due to patient on home aspirin.  2D Echo  EF 55-60%  LDL 62  HgbA1c pending  SCDs for VTE prophylaxis  Diet Heart Room service appropriate?: Yes; Fluid consistency:: Thin   aspirin 81 mg orally every day prior to admission, now on no antithrombotic due to ICH  Ongoing aggressive stroke risk factor management  Therapy recommendations:  Pending  Disposition:  Pending  Afib  On amiodarone  Rate controlled  Home aspirin discontinued due to ICH  Hypertension  Home meds:   Losartan BP goal < 160 Currently on irbesartan  Stable  Patient counseled to be compliant with her blood pressure medications  Hyperlipidemia  Home meds:  Lipitor 20   LDL 62, goal < 70  Lipitor on hold due to ICH  Resume statin  at discharge  Other Stroke Risk Factors  Advanced age  Coronary artery disease  Other Active Problems  Mild anemia  Mild leukocytosis  Other Pertinent History    Hospital day # 1  This patient is critically ill due to bilateral basal ganglion bleeding and subdural hematoma and at significant risk of neurological worsening, death form hematoma extension, cerebral edema, brain herniation. This patient's care requires constant monitoring of vital signs, hemodynamics, respiratory and cardiac monitoring, review of multiple databases, neurological assessment, discussion with family, other specialists and medical decision making of high complexity. I spent 40 minutes of neurocritical care time in the care of this patient.   Marvel PlanJindong Mosie Angus, MD PhD Stroke Neurology 06/17/2014 5:02 PM    To contact Stroke  Continuity provider, please refer to [email or web address]. After hours, contact General Neurology

## 2014-06-17 NOTE — Evaluation (Signed)
Physical Therapy Evaluation Patient Details Name: [name] MRN: [id number] DOB: 07/17/1920 Today's Date: 06/17/2014   History of Present Illness  81 yo female with PMH AFIB, Carotid Stenosis and HTN. S/p fall today. Head CT Scan reveals small acute R thalamic infarction and small frontal parasagittal SDH without mass effect  Clinical Impression  Patient demonstrates deficits in functional mobility as indicated below. Will need continued skilled PT to address deficits and maximize function. Will see as indicated and progress as tolerated. OF NOTE: Patient was very independent prior to event, was living at home alone, managing very well and active. Has great family support.     Follow Up Recommendations CIR;Supervision/Assistance - 24 hour    Equipment Recommendations  Other (comment) (tbd)    Recommendations for Other Services Rehab consult     Precautions / Restrictions Precautions Precautions: Fall Restrictions Weight Bearing Restrictions: No      Mobility  Bed Mobility               General bed mobility comments: received up in chair  Transfers Overall transfer level: Needs assistance Equipment used: 2 person hand held assist Transfers: Sit to/from Stand Sit to Stand: Mod assist         General transfer comment: VCs for hand placement and assist to power upto stnaidng, assist for stability  Ambulation/Gait Ambulation/Gait assistance: Mod assist;Max assist Ambulation Distance (Feet): 18 Feet Assistive device: 1 person hand held assist Gait Pattern/deviations: Step-to pattern;Decreased step length - left;Decreased stance time - left;Decreased dorsiflexion - left;Decreased weight shift to right;Narrow base of support Gait velocity: decreased   General Gait Details: Patient required 1 person wrap around support with gait belt. VCs for upright posture and faciliatation of weight shift and LLE advancement  Stairs            Wheelchair Mobility     Modified Rankin (Stroke Patients Only) Modified Rankin (Stroke Patients Only) Pre-Morbid Rankin Score: No symptoms Modified Rankin: Moderately severe disability     Balance Overall balance assessment: Needs assistance   Sitting balance-Leahy Scale: Fair       Standing balance-Leahy Scale: Zero                               Pertinent Vitals/Pain Pain Assessment: No/denies pain    Home Living Family/patient expects to be discharged to:: Private residence Living Arrangements: Alone Available Help at Discharge: Family Type of Home: House Home Access: Level entry     Home Layout: Bed/bath upstairs Home Equipment: Cane - single point      Prior Function Level of Independence: Independent         Comments: occassional use of cane     Hand Dominance   Dominant Hand: Right    Extremity/Trunk Assessment   Upper Extremity Assessment: Generalized weakness           Lower Extremity Assessment: LLE deficits/detail   LLE Deficits / Details: LLE weakness noted 3/5  Cervical / Trunk Assessment: Kyphotic  Communication   Communication: HOH  Cognition Arousal/Alertness: Awake/alert Behavior During Therapy: Flat affect Overall Cognitive Status: Within Functional Limits for tasks assessed                      General Comments      Exercises        Assessment/Plan    PT Assessment Patient needs continued PT services  PT Diagnosis Difficulty walking;Abnormality of  gait;Generalized weakness   PT Problem List Decreased strength;Decreased range of motion;Decreased activity tolerance;Decreased balance;Decreased mobility;Decreased coordination;Decreased safety awareness;Impaired sensation  PT Treatment Interventions DME instruction;Gait training;Stair training;Functional mobility training;Therapeutic activities;Therapeutic exercise;Balance training;Neuromuscular re-education;Patient/family education   PT Goals (Current goals can be found in  the Care Plan section) Acute Rehab PT Goals Patient Stated Goal: to get better PT Goal Formulation: With patient/family Time For Goal Achievement: 07/01/14 Potential to Achieve Goals: Good    Frequency Min 4X/week   Barriers to discharge        Co-evaluation               End of Session Equipment Utilized During Treatment: Gait belt;Oxygen Activity Tolerance: Patient tolerated treatment well Patient left: in chair;with call bell/phone within reach;with family/visitor present Nurse Communication: Mobility status         Time: [id number] PT Time Calculation (min) (ACUTE ONLY): 24 min   Charges:   PT Evaluation $Initial PT Evaluation Tier I: 1 Procedure PT Treatments $Therapeutic Activity: 8-22 mins   PT G CodesFabio Asa:        Shanon Seawright J 06/17/2014, 3:40 PM Charlotte Crumbevon Jonnatan Hanners, PT DPT  [phone]

## 2014-06-17 NOTE — Evaluation (Signed)
Speech Language Pathology Evaluation Patient Details Name: [name] MRN: [id number] DOB: 03/31/1920 Today's Date: 06/17/2014 Time: [id number] SLP Time Calculation (min) (ACUTE ONLY): 10 min  Problem List:  Patient Active Problem List   Diagnosis Date Noted  . SDH (subdural hematoma) 06/16/2014  . Nontraumatic hemorrhage of cerebral hemisphere   . Hypertensive urgency   . CAD (coronary artery disease) of artery bypass graft 06/15/2012  . Carotid bruit present 06/15/2012  . Raynaud phenomenon 02/08/2011  . PAF (paroxysmal atrial fibrillation) 10/09/2010  . High risk medication use 10/09/2010  . Aortic valve disease 10/09/2010  . HYPERLIPIDEMIA 06/10/2007  . HYPERTENSION 06/10/2007  . ALLERGIC RHINITIS 06/10/2007  . EMPHYSEMA 06/10/2007  . DYSPNEA 06/10/2007  . COUGH 06/10/2007   Past Medical History:  Past Medical History  Diagnosis Date  . Atrial fibrillation with rapid ventricular response Feb 2010  . Hypertension   . Flash pulmonary edema Feb 2010    Due to atrial fib with RVR  . Aortic insufficiency Feb 2010    Mild to moderate per echo with normal EF  . Arthritis   . Hyperlipidemia   . Dementia   . Vitamin D deficiency   . IBS (irritable bowel syndrome)   . PUD (peptic ulcer disease)   . Compression fracture   . Raynaud phenomenon   . CAD (coronary artery disease)     2010.  60% RCA, 60 - 70% LAD  . Carotid stenosis     40 - 59% bilateral   Past Surgical History:  Past Surgical History  Procedure Laterality Date  . Cardiac catheterization  03/11/2008    EF 45%  . Appendectomy    . Transthoracic echocardiogram  03/06/2008    EF 55%   HPI:  81 y.o. female with a history of hypertension, hyperlipidemia, atrial fibrillation,and coronary artery disease, brought to the emergency room following a fall at home. CT scan of her head showed a small acute right thalamic infarction as well as small right frontal parasagittal subdural hematoma without mass effect.     Assessment / Plan / Recommendation Clinical Impression  Basic cognitive abilities appear within functional limits. No family present to establish baseline cognitive ability. Pt states she lives alone with family nearby and daily check in's. Suspect she may have difficulty in higher level/more challenging tasks such as medication and money management. ST will work with pt short term.    SLP Assessment  Patient needs continued Speech Lanaguage Pathology Services    Follow Up Recommendations   (TBD)    Frequency and Duration min 1 x/week  2 weeks   Pertinent Vitals/Pain Pain Assessment: No/denies pain   SLP Goals  Potential to Achieve Goals (ACUTE ONLY): Good  SLP Evaluation Prior Functioning  Cognitive/Linguistic Baseline:  (assume functional, lives alone, no family present)  Lives With: Alone Available Help at Discharge: Family   Cognition  Overall Cognitive Status: No family/caregiver present to determine baseline cognitive functioning Arousal/Alertness: Awake/alert (keeps eyes closed) Orientation Level: Oriented to person;Oriented to place;Oriented to time;Oriented to situation Attention: Sustained Sustained Attention: Appears intact Memory:  (will assess further) Awareness: Appears intact Problem Solving: Appears intact Safety/Judgment: Appears intact    Comprehension  Auditory Comprehension Overall Auditory Comprehension: Appears within functional limits for tasks assessed Visual Recognition/Discrimination Discrimination: Not tested Reading Comprehension Reading Status:  (TBA)    Expression Expression Primary Mode of Expression: Verbal Verbal Expression Overall Verbal Expression: Appears within functional limits for tasks assessed Pragmatics: No impairment Written Expression Dominant Hand: Right  Written Expression:  (TBA)   Oral / Motor Oral Motor/Sensory Function Overall Oral Motor/Sensory Function: Appears within functional limits for tasks assessed Motor  Speech Overall Motor Speech: Appears within functional limits for tasks assessed Intelligibility: Intelligible Motor Planning: Witnin functional limits   GO     Royce MacadamiaLitaker, Shanine Kreiger Willis 06/17/2014, 3:16 PM  Breck CoonsLisa Willis Jaydalynn Olivero M.Ed [employer]-SLP Pager [phone]

## 2014-06-17 NOTE — Progress Notes (Signed)
Rehab Admissions Coordinator Note:  Patient was screened by Clois DupesBoyette, Zamorah Ailes Godwin for appropriateness for an Inpatient Acute Rehab Consult per PT recommendation.  At this time, we are recommending Inpatient Rehab consult.  Clois DupesBoyette, Hafsa Lohn Godwin 06/17/2014, 4:09 PM  I can be reached at [phone].

## 2014-06-17 NOTE — Progress Notes (Signed)
PT Cancellation Note  Patient Details Name: [name] MRN: [id number] DOB: 01/25/1921   Cancelled Treatment:    Reason Eval/Treat Not Completed: Patient not medically ready (active bedrest orders)   [name], [name] 06/17/2014, 7:38 AM [name], PT DPT  [phone]

## 2014-06-17 NOTE — Progress Notes (Signed)
UR completed.  Await medical stability for therapy evaluations to determine next level of care needed.    Jerrell BelfastJulie Wise Rianna Lukes, RN, BSN Phone #[phone]

## 2014-06-17 NOTE — Progress Notes (Signed)
Echocardiogram 2D Echocardiogram has been performed.  Nolon RodBrown, Tony 06/17/2014, 1:32 PM

## 2014-06-18 ENCOUNTER — Inpatient Hospital Stay (HOSPITAL_COMMUNITY): Payer: Medicare Other

## 2014-06-18 LAB — HEMOGLOBIN A1C
HEMOGLOBIN A1C: 5.9 % — AB (ref 4.8–5.6)
Mean Plasma Glucose: 123 mg/dL

## 2014-06-18 LAB — CBC
HCT: 29.1 % — ABNORMAL LOW (ref 36.0–46.0)
Hemoglobin: 9.2 g/dL — ABNORMAL LOW (ref 12.0–15.0)
MCH: 29.7 pg (ref 26.0–34.0)
MCHC: 31.6 g/dL (ref 30.0–36.0)
MCV: 93.9 fL (ref 78.0–100.0)
Platelets: 171 10*3/uL (ref 150–400)
RBC: 3.1 MIL/uL — ABNORMAL LOW (ref 3.87–5.11)
RDW: 14.1 % (ref 11.5–15.5)
WBC: 8.2 10*3/uL (ref 4.0–10.5)

## 2014-06-18 LAB — BASIC METABOLIC PANEL
Anion gap: 6 (ref 5–15)
BUN: 10 mg/dL (ref 6–20)
CO2: 27 mmol/L (ref 22–32)
CREATININE: 0.6 mg/dL (ref 0.44–1.00)
Calcium: 8.2 mg/dL — ABNORMAL LOW (ref 8.9–10.3)
Chloride: 106 mmol/L (ref 101–111)
GFR calc Af Amer: >60 mL/min (ref >60)
Glucose, Bld: 102 mg/dL — ABNORMAL HIGH (ref 65–99)
Potassium: 4 mmol/L (ref 3.5–5.1)
Sodium: 139 mmol/L (ref 135–145)

## 2014-06-18 MED ORDER — LABETALOL HCL 5 MG/ML IV SOLN
10.0000 mg | INTRAVENOUS | Status: DC | PRN
  Administered 2014-06-20: 10 mg via INTRAVENOUS
  Filled 2014-06-18: qty 4

## 2014-06-18 NOTE — Progress Notes (Signed)
STROKE TEAM PROGRESS NOTE   SUBJECTIVE (INTERVAL HISTORY) Her daughter is at the bedside.  Overall her condition is stable. She still has mild left hemiparesis but mentally intact. CT repeat showed stable hematoma. Will transfer to floor. CIR consulted.   OBJECTIVE Temp:  [98.2 F (36.8 C)-99.1 F (37.3 C)] 98.2 F (36.8 C) (05/21 0800) Pulse Rate:  [66-86] 70 (05/21 0900) Cardiac Rhythm:  [-] Normal sinus rhythm (05/21 0800) Resp:  [13-22] 21 (05/21 0900) BP: (112-181)/(43-75) 136/43 mmHg (05/21 0900) SpO2:  [92 %-99 %] 96 % (05/21 0900)  No results for input(s): GLUCAP in the last 168 hours.  Recent Labs Lab 06/16/14 1802 06/17/14 0828 06/18/14 0249  NA 137 133* 139  K 5.2* 3.9 4.0  CL 103 103 106  CO2 GLUCOSE 104* 118* 102*  BUN CREATININE 0.90 0.61 0.60  CALCIUM 8.8* 7.8* 8.2*    Recent Labs Lab 06/16/14 1802  AST 43*  ALT 24  ALKPHOS 77  BILITOT 0.9  PROT 7.2  ALBUMIN 3.7    Recent Labs Lab 06/16/14 1802 06/17/14 0828 06/18/14 0249  WBC 7.7 10.6* 8.2  NEUTROABS 4.1  --   --   HGB 10.9* 9.2* 9.2*  HCT 34.4* 28.4* 29.1*  MCV 93.7 92.5 93.9  PLT 193 181 171   No results for input(s): CKTOTAL, CKMB, CKMBINDEX, TROPONINI in the last 168 hours.  Recent Labs  06/16/14 1924  LABPROT 15.2  INR 1.18    Recent Labs  06/16/14 2000  COLORURINE YELLOW  LABSPEC 1.012  PHURINE 6.5  GLUCOSEU NEGATIVE  HGBUR NEGATIVE  BILIRUBINUR NEGATIVE  KETONESUR NEGATIVE  PROTEINUR NEGATIVE  UROBILINOGEN 0.2  NITRITE NEGATIVE  LEUKOCYTESUR NEGATIVE       Component Value Date/Time   CHOL 118 06/17/2014 0828   TRIG 96 06/17/2014 0828   HDL 37* 06/17/2014 0828   CHOLHDL 3.2 06/17/2014 0828   VLDL 19 06/17/2014 0828   LDLCALC 62 06/17/2014 0828   Lab Results  Component Value Date   HGBA1C 5.9* 06/17/2014   No results found for: LABOPIA, COCAINSCRNUR, LABBENZ, AMPHETMU, THCU, LABBARB  No results for input(s): ETH in the last 168  hours.  I have personally reviewed the radiological images below and agree with the radiology interpretations.  Ct Head Wo Contrast Evolving intraparenchymal hematomas, largest in RIGHT thalamus measuring 15 x 10 mm. Stable multifocal subdural hematomas measuring up to 5 mm along the RIGHT falx. Trace subarachnoid blood in LEFT sylvian fissure. Involutional changes. Moderate white matter changes most consistent chronic small vessel ischemic disease.  06/17/2014   IMPRESSION: Persistent changes as described above. There is been slight increase in the size of right thalamic and right parafalcine hematomas as described above.   06/16/2014   IMPRESSION: Positive for intracranial hemorrhage. Largest hemorrhagic focus is located in the right thalamus. There are additional parenchymal hemorrhages in the left white matter tract. The right thalamic hemorrhage may be the cause for the patient's falling and the other areas of hemorrhage could be posttraumatic. In addition, there is evidence for extra-axial blood along the anterior falx and along the left temporal lobe which is compatible with small subdural hematomas.  Atrophy and extensive white matter disease. No evidence for midline shift.  Multilevel degenerative changes in cervical spine without acute bone abnormality.    Chest Port 1 View  06/17/2014   IMPRESSION: COPD with biapical parenchymal scarring likely secondary to previous granulomatous infection. There is no acute cardiopulmonary abnormality.  06/16/2014   IMPRESSION: COPD changes with biapical scarring and calcification.  Enlargement of cardiac silhouette.  No acute abnormalities or interval change.   2D Echocardiogram  - Left ventricle: The cavity size was normal. Wall thickness was normal. Systolic function was normal. The estimated ejection fraction was in the range of 55% to 60%. Doppler parameters are consistent with abnormal left ventricular relaxation (grade 1 diastolic  dysfunction). - Aortic valve: Severely calcified non coronary cusp. There was mild stenosis. There was mild to moderate regurgitation. Valve area (VTI): 1.56 cm^2. Valve area (Vmax): 1.26 cm^2. Valve area (Vmean): 1.39 cm^2. - Mitral valve: Calcified annulus. Moderately thickened leaflets . - Left atrium: The atrium was moderately dilated. - Atrial septum: No defect or patent foramen ovale was identified.  EKG  normal sinus rhythm. For complete results please see formal report.  PHYSICAL EXAM  Temp:  [98.2 F (36.8 C)-99.1 F (37.3 C)] 98.2 F (36.8 C) (05/21 0800) Pulse Rate:  [66-86] 70 (05/21 0900) Resp:  [13-22] 21 (05/21 0900) BP: (112-181)/(43-75) 136/43 mmHg (05/21 0900) SpO2:  [92 %-99 %] 96 % (05/21 0900)  General - Well nourished, well developed, in no apparent distress.  Ophthalmologic - fundi not visualized due to noncooperation.  Cardiovascular - Regular rate and rhythm.  Neck - supple, no carotid bruits  Mental Status -  Level of arousal and orientation to time, place, and person were intact. Language including expression, naming, repetition, comprehension was assessed and found intact. Attention span and concentration were normal, able to calculate.  Cranial Nerves II - XII - II - Visual field intact OU. III, IV, VI - Extraocular movements intact. V - Facial sensation intact bilaterally. VII - left mild nasolabial fold flattening. VIII - Hard of hearing & vestibular intact bilaterally. X - Palate elevates symmetrically. XI - Chin turning & shoulder shrug intact bilaterally. XII - Tongue protrusion intact.  Motor Strength - The patient's strength was 4+/5 LUE and LLE and mild pronator drift was present on the left.  Bulk was normal and fasciculations were absent.   Motor Tone - Muscle tone was assessed at the neck and appendages and was normal.  Reflexes - The patient's reflexes were symmetrical in all extremities and she had bilateral positive  Babinski.  Sensory - Light touch, temperature/pinprick were assessed and were symmetrical.    Coordination - The patient had normal movements in the right hand with no ataxia or dysmetria.  Tremor was absent.  Gait and Station - deferred due to safety concerns.   ASSESSMENT/PLAN [name] is a 81 y.o. female with history of hypertension, hyperlipidemia, [facility], [facility] admitted for ICH and SDH after fall. Symptoms stable.    Bilateral BG ICH with parafalcine SDH and left temporal SAH - SDH/SAH likely traumatic due to fall  CT head repeat showed stable hematoma and SDH/SAH.  2D Echo  EF 55-60%  LDL 62  HgbA1c 5.9  SCDs for VTE prophylaxis  Diet Heart Room service appropriate?: Yes; Fluid consistency:: Thin   aspirin 81 mg orally every day prior to admission, now on no antithrombotic due to ICH  Ongoing aggressive stroke risk factor management  Therapy recommendations:  CIR  Disposition:  Transfer to floor  Afib  On amiodarone  Rate controlled  Home aspirin discontinued due to ICH  [facility] However, due to her right BG spontaneous hemorrhage in the setting of BP under control, she will have higher risk of bleeding  if on regular AC in the future. Risk and benefit discussed with pt and her daughter.  Hypertension  Home meds:   Losartan BP goal < 160 Currently on irbesartan  Stable  Patient counseled to be compliant with her blood pressure medications  Hyperlipidemia  Home meds:  Lipitor 20   LDL 62, goal < 70  Lipitor on hold due to ICH  Resume statin at discharge  Other Stroke Risk Factors  Advanced age  Coronary artery disease  Other Active Problems  Mild anemia - stable  Mild leukocytosis - resolved  Other Pertinent History    Hospital day # 2   Marvel Plan, MD PhD Stroke Neurology 06/18/2014 12:45 PM    To contact Stroke Continuity provider, please refer to [email or web address]. After hours,  contact General Neurology

## 2014-06-19 DIAGNOSIS — I482 Chronic atrial fibrillation: Secondary | ICD-10-CM

## 2014-06-19 LAB — BASIC METABOLIC PANEL
ANION GAP: 8 (ref 5–15)
BUN: 11 mg/dL (ref 6–20)
CO2: 26 mmol/L (ref 22–32)
CREATININE: 0.53 mg/dL (ref 0.44–1.00)
Calcium: 8.4 mg/dL — ABNORMAL LOW (ref 8.9–10.3)
Chloride: 103 mmol/L (ref 101–111)
GFR calc non Af Amer: >60 mL/min (ref >60)
Glucose, Bld: 102 mg/dL — ABNORMAL HIGH (ref 65–99)
POTASSIUM: 3.9 mmol/L (ref 3.5–5.1)
Sodium: 137 mmol/L (ref 135–145)

## 2014-06-19 LAB — CBC
HCT: 29.2 % — ABNORMAL LOW (ref 36.0–46.0)
HEMOGLOBIN: 9.3 g/dL — AB (ref 12.0–15.0)
MCH: 30 pg (ref 26.0–34.0)
MCHC: 31.8 g/dL (ref 30.0–36.0)
MCV: 94.2 fL (ref 78.0–100.0)
PLATELETS: 184 10*3/uL (ref 150–400)
RBC: 3.1 MIL/uL — ABNORMAL LOW (ref 3.87–5.11)
RDW: 13.9 % (ref 11.5–15.5)
WBC: 9.9 10*3/uL (ref 4.0–10.5)

## 2014-06-19 NOTE — Evaluation (Signed)
Occupational Therapy Evaluation Patient Details Name: [name] MRN: [id number] DOB: [birth year] Today's Date: 06/19/2014    History of Present Illness 81 yo female with PMH AFIB, Carotid Stenosis and HTN. S/p fall. Head CT Scan reveals small acute R thalamic infarction and small frontal parasagittal SDH without mass effect   Clinical Impression   PTA pt lived at home alone and was independent with ADLs with occasional use of cane. She does not drive, but performs all her own IADLs. Pt is highly motivated to return to independence and was eager to participate with therapy. She required mod A for functional mobility due to left lateral and posterior lean and required assist for ADLs due to LUE impairment and balance deficits. Pt is an excellent candidate for CIR for intensive therapy to return to independent living. She has strong family support. Pt will benefit from acute OT to progress with functional mobility and ADLs.     Follow Up Recommendations  CIR;Supervision/Assistance - 24 hour    Equipment Recommendations  Other (comment) (TBD)    Recommendations for Other Services       Precautions / Restrictions Precautions Precautions: Fall Restrictions Weight Bearing Restrictions: No      Mobility Bed Mobility Overal bed mobility: Needs Assistance Bed Mobility: Supine to Sit     Supine to sit: Mod assist     General bed mobility comments: Pt used UEs on bed rail to pull self up. Able to bring LEs to EOB but required mod A to rotate hips with use of bed pad and elevate trunk to sitting. Assist to scoot to EOB.   Transfers Overall transfer level: Needs assistance Equipment used: Rolling walker (2 wheeled) Transfers: Sit to/from [employer] Sit to Stand: Mod assist Stand pivot transfers: Mod assist       General transfer comment: VC's for hand placement. Mod A to stand and balance. Left lateral lean in standing.     Balance Overall balance assessment:  Needs assistance Sitting-balance support: No upper extremity supported;Feet supported Sitting balance-Leahy Scale: Poor Sitting balance - Comments: posterior and left lateral lean. Responds to VC's to sit up.  Postural control: Posterior lean;Left lateral lean Standing balance support: Bilateral upper extremity supported;During functional activity Standing balance-Leahy Scale: Poor Standing balance comment: Left lateral lean in standing. Mod A to maintain balance                            ADL Overall ADL's : Needs assistance/impaired Eating/Feeding: Set up;Sitting Eating/Feeding Details (indicate cue type and reason): assist to open containers Grooming: Set up;Sitting Grooming Details (indicate cue type and reason): pt actively using LUE to wipe face and is highly motivated.  Upper Body Bathing: Minimal assitance;Sitting   Lower Body Bathing: Moderate assistance;Sit to/from stand   Upper Body Dressing : Moderate assistance;Sitting   Lower Body Dressing: Maximal assistance;Sit to/from stand   Toilet Transfer: Moderate assistance;Stand-pivot;RW;BSC   Toileting- Clothing Manipulation and Hygiene: Maximal assistance;Sit to/from stand       Functional mobility during ADLs: Moderate assistance;Rolling walker General ADL Comments: Pt is highly motivated to return to independence and participated fully during session. Sat EOB for about 10 minutes to facilitate trunk control. Pt presents with posterior and left lateral lean and is able to state which way she is leaning. Responds well to VC's to correct but only able to hold upright position for a few seconds before drifting posterior/lateral. Pt participated in dynamic reaching activities  sitting EOB using LUE. Good scapular movement with slightly weaker shoulder elevation than Right. Pt stood with Mod A  and was noted to have strong lateral and lean in standing. Pt also had loose stool. Pt stood for several minutes with min A to  balance for pericare then [facility]. Pt does present with some left visual field cut (to be further assessed)      Vision Vision Assessment?: Yes Eye Alignment: Within Functional Limits Ocular Range of Motion: Within Functional Limits Alignment/Gaze Preference: Within Defined Limits Visual Fields: Left visual field deficit Additional Comments: Pt had mild difficulty with visual screen, To be further assessed.    Perception     Praxis Praxis Praxis tested?: Within functional limits    Pertinent Vitals/Pain Pain Assessment: No/denies pain     Hand Dominance Right   Extremity/Trunk Assessment Upper Extremity Assessment Upper Extremity Assessment: LUE deficits/detail;Generalized weakness LUE Deficits / Details: generalized weakness. L shoulder flexion 3/5 (Full PROM and able to hold at full ROM before drift occurs). 4/5 throughout rest of LUE.  LUE Coordination: decreased gross motor;decreased fine motor   Lower Extremity Assessment Lower Extremity Assessment: Defer to PT evaluation   Cervical / Trunk Assessment Cervical / Trunk Assessment: Kyphotic   Communication Communication Communication: HOH (wears Bil hearing aids)   Cognition Arousal/Alertness: Awake/alert Behavior During Therapy: WFL for tasks assessed/performed Overall Cognitive Status: Within Functional Limits for tasks assessed                                Home Living Family/patient expects to be discharged to:: Private residence Living Arrangements: Alone Available Help at Discharge: Family Type of Home: House Home Access: Level entry     Home Layout: Bed/bath upstairs               Home Equipment: [employer] Mor[employer]ane - single point      Lives With: Alone    Prior Functioning/Environment Level of Independence: Independent        Comments: occassional use of cane; stopped driving on birthday last year    OT Diagnosis: Generalized weakness;Acute pain;Hemiplegia non-dominant  side;Disturbance of vision   OT Problem List: Decreased strength;Decreased range of motion;Decreased activity tolerance;Impaired balance (sitting and/or standing);Impaired vision/perception;Decreased coordination;Decreased cognition;Decreased safety awareness;Impaired UE functional use   OT Treatment/Interventions: Self-care/ADL training;Therapeutic exercise;Neuromuscular education;Energy conservation;DME and/or AE instruction;Therapeutic activities;Visual/perceptual remediation/compensation;Patient/family education;Balance training    OT Goals(Current goals can be found in the care plan section) Acute Rehab OT Goals Patient Stated Goal: to get back to my home and be independent OT Goal Formulation: With patient Time For Goal Achievement: 07/03/14 Potential to Achieve Goals: Good ADL Goals Pt Will Perform Grooming: with supervision;standing Pt Will Perform Upper Body Bathing: with set-up;with supervision;sitting Pt Will Perform Upper Body Dressing: with set-up;with supervision;sitting Pt Will Transfer to Toilet: with min assist;stand pivot transfer;bedside commode Pt Will Perform Toileting - Clothing Manipulation and hygiene: with min assist;sit to/from stand Pt/caregiver will Perform Home Exercise Program: Increased strength;Left upper extremity;With Supervision  OT Frequency: Min 3X/week    End of Session Equipment Utilized During Treatment: Gait belt;Rolling walker Nurse Communication: Mobility status;Other (comment) (loose stool)  Activity Tolerance: Patient tolerated treatment well Patient left: in bed;with call bell/phone within reach;with bed alarm set;with family/visitor present   Time: 1451-1536 OT Time Calculation (min): 45 min Charges:  OT General Charges $OT Visit: 1 Procedure OT Evaluation $Initial OT Evaluation Tier I: 1 Procedure OT Treatments $Self  Care/Home Management : 8-22 mins $Therapeutic Activity: 8-22 mins G-Codes:    Rae Lips 07/08/14, 4:21  PM  Carney Living, OTR/L Occupational Therapist [phone] (pager)

## 2014-06-19 NOTE — Progress Notes (Signed)
STROKE TEAM PROGRESS NOTE   SUBJECTIVE (INTERVAL HISTORY) The patient's daughter is present today. She is very concerned about the patient's ability to return to safe ambulation. Inpatient rehabilitation was discussed as well as the possibility of assisted living facilities. She would like pt to be admitted to CIR before going home.  OBJECTIVE Temp:  [98.1 F (36.7 C)-99.2 F (37.3 C)] 98.4 F (36.9 C) (05/22 1330) Pulse Rate:  [67-87] 75 (05/22 1330) Cardiac Rhythm:  [-] Normal sinus rhythm (05/22 1155) Resp:  [16-20] 18 (05/22 1330) BP: (123-181)/(38-88) 144/50 mmHg (05/22 1330) SpO2:  [93 %-95 %] 94 % (05/22 1330)  No results for input(s): GLUCAP in the last 168 hours.  Recent Labs Lab 06/16/14 1802 06/17/14 0828 06/18/14 0249 06/19/14 0348  NA 137 133* 139 137  K 5.2* 3.9 4.0 3.9  CL 103 103 106 103  CO2 25 23 27 26   GLUCOSE 104* 118* 102* 102*  BUN 20 15 10 11   CREATININE 0.90 0.61 0.60 0.53  CALCIUM 8.8* 7.8* 8.2* 8.4*    Recent Labs Lab 06/16/14 1802  AST 43*  ALT 24  ALKPHOS 77  BILITOT 0.9  PROT 7.2  ALBUMIN 3.7    Recent Labs Lab 06/16/14 1802 06/17/14 0828 06/18/14 0249 06/19/14 0348  WBC 7.7 10.6* 8.2 9.9  NEUTROABS 4.1  --   --   --   HGB 10.9* 9.2* 9.2* 9.3*  HCT 34.4* 28.4* 29.1* 29.2*  MCV 93.7 92.5 93.9 94.2  PLT 193 181 171 184   No results for input(s): CKTOTAL, CKMB, CKMBINDEX, TROPONINI in the last 168 hours.  Recent Labs  06/16/14 1924  LABPROT 15.2  INR 1.18    Recent Labs  06/16/14 2000  COLORURINE YELLOW  LABSPEC 1.012  PHURINE 6.5  GLUCOSEU NEGATIVE  HGBUR NEGATIVE  BILIRUBINUR NEGATIVE  KETONESUR NEGATIVE  PROTEINUR NEGATIVE  UROBILINOGEN 0.2  NITRITE NEGATIVE  LEUKOCYTESUR NEGATIVE       Component Value Date/Time   CHOL 118 06/17/2014 0828   TRIG 96 06/17/2014 0828   HDL 37* 06/17/2014 0828   CHOLHDL 3.2 06/17/2014 0828   VLDL 19 06/17/2014 0828   LDLCALC 62 06/17/2014 0828   Lab Results  Component  Value Date   HGBA1C 5.9* 06/17/2014   No results found for: LABOPIA, COCAINSCRNUR, LABBENZ, AMPHETMU, THCU, LABBARB  No results for input(s): ETH in the last 168 hours.  I have personally reviewed the radiological images below and agree with the radiology interpretations.  Ct Head Wo Contrast Evolving intraparenchymal hematomas, largest in RIGHT thalamus measuring 15 x 10 mm. Stable multifocal subdural hematomas measuring up to 5 mm along the RIGHT falx. Trace subarachnoid blood in LEFT sylvian fissure. Involutional changes. Moderate white matter changes most consistent chronic small vessel ischemic disease.  06/17/2014   IMPRESSION: Persistent changes as described above. There is been slight increase in the size of right thalamic and right parafalcine hematomas as described above.   06/16/2014   IMPRESSION: Positive for intracranial hemorrhage. Largest hemorrhagic focus is located in the right thalamus. There are additional parenchymal hemorrhages in the left white matter tract. The right thalamic hemorrhage may be the cause for the patient's falling and the other areas of hemorrhage could be posttraumatic. In addition, there is evidence for extra-axial blood along the anterior falx and along the left temporal lobe which is compatible with small subdural hematomas.  Atrophy and extensive white matter disease. No evidence for midline shift.  Multilevel degenerative changes in cervical spine without acute  bone abnormality.    Chest Port 1 View  06/17/2014   IMPRESSION: COPD with biapical parenchymal scarring likely secondary to previous granulomatous infection. There is no acute cardiopulmonary abnormality.   06/16/2014   IMPRESSION: COPD changes with biapical scarring and calcification.  Enlargement of cardiac silhouette.  No acute abnormalities or interval change.   2D Echocardiogram  - Left ventricle: The cavity size was normal. Wall thickness was normal. Systolic function was normal. The  estimated ejection fraction was in the range of 55% to 60%. Doppler parameters are consistent with abnormal left ventricular relaxation (grade 1 diastolic dysfunction). - Aortic valve: Severely calcified non coronary cusp. There was mild stenosis. There was mild to moderate regurgitation. Valve area (VTI): 1.56 cm^2. Valve area (Vmax): 1.26 cm^2. Valve area (Vmean): 1.39 cm^2. - Mitral valve: Calcified annulus. Moderately thickened leaflets . - Left atrium: The atrium was moderately dilated. - Atrial septum: No defect or patent foramen ovale was identified.  EKG  normal sinus rhythm. For complete results please see formal report.  PHYSICAL EXAM  Temp:  [98.1 F (36.7 C)-99.2 F (37.3 C)] 98.4 F (36.9 C) (05/22 1330) Pulse Rate:  [67-87] 75 (05/22 1330) Resp:  [16-20] 18 (05/22 1330) BP: (123-181)/(38-88) 144/50 mmHg (05/22 1330) SpO2:  [93 %-95 %] 94 % (05/22 1330)  General - Well nourished, well developed, in no apparent distress.  Ophthalmologic - fundi not visualized due to noncooperation.  Cardiovascular - Regular rate and rhythm.  Neck - supple, no carotid bruits  Mental Status -  Level of arousal and orientation to time, place, and person were intact. Language including expression, naming, repetition, comprehension was assessed and found intact. Attention span and concentration were normal, able to calculate.  Cranial Nerves II - XII - II - Visual field intact OU. III, IV, VI - Extraocular movements intact. V - Facial sensation intact bilaterally. VII - left mild nasolabial fold flattening. VIII - Hard of hearing & vestibular intact bilaterally. X - Palate elevates symmetrically. XI - Chin turning & shoulder shrug intact bilaterally. XII - Tongue protrusion intact.  Motor Strength - The patient's strength was 4+/5 LUE and LLE and mild pronator drift was present on the left.  Bulk was normal and fasciculations were absent.   Motor Tone - Muscle tone  was assessed at the neck and appendages and was normal.  Reflexes - The patient's reflexes were symmetrical in all extremities and she had bilateral positive Babinski.  Sensory - Light touch, temperature/pinprick were assessed and were symmetrical.    Coordination - The patient had normal movements in the right hand with no ataxia or dysmetria.  Tremor was absent.  Gait and Station - deferred due to safety concerns.   ASSESSMENT/PLAN Ms. [name] is a 81 y.o. female with history of hypertension, hyperlipidemia, A. fib not on [facility], CAD admitted for ICH and SDH after fall. Symptoms stable.    Bilateral BG ICH with parafalcine SDH and left temporal SAH - SDH/SAH likely traumatic due to fall  CT head repeat showed stable hematoma and SDH/SAH.  2D Echo  EF 55-60%  LDL 62  HgbA1c 5.9  SCDs for VTE prophylaxis  Diet Heart Room service appropriate?: Yes; Fluid consistency:: Thin   aspirin 81 mg orally every day prior to admission, now on no antithrombotic due to ICH  Ongoing aggressive stroke risk factor management  Therapy recommendations:  CIR - rehabilitation M.D. consult pending.  Disposition:  Pending  Afib  On amiodarone  Rate controlled  Home aspirin discontinued due to ICH  Currently she is not candidate for [facility]. However, due to her right BG spontaneous hemorrhage in the setting of BP under control, she will have higher risk of bleeding if on regular AC in the future. Risk and benefit discussed with pt and her daughter.  Hypertension  Home meds:   Losartan  BP goal < 160  Currently on irbesartan  Stable  Patient counseled to be compliant with her blood pressure medications  Hyperlipidemia  Home meds:  Lipitor 20   LDL 62, goal < 70  Lipitor on hold due to ICH  Resume statin at discharge  Other Stroke Risk Factors  Advanced age  Coronary artery disease  Other Active Problems  Mild anemia - stable  Mild leukocytosis - resolved  Other  Pertinent History     Hassel Neth [facility] Pager [phone] 06/19/2014, 2:51 PM  Hospital day # 3   Marvel Plan, MD PhD Stroke Neurology 06/19/2014 3:16 PM    To contact Stroke Continuity provider, please refer to [email or web address]. After hours, contact General Neurology

## 2014-06-20 DIAGNOSIS — S069X4S Unspecified intracranial injury with loss of consciousness of 6 hours to 24 hours, sequela: Secondary | ICD-10-CM

## 2014-06-20 DIAGNOSIS — R5381 Other malaise: Secondary | ICD-10-CM

## 2014-06-20 DIAGNOSIS — S069X9A Unspecified intracranial injury with loss of consciousness of unspecified duration, initial encounter: Secondary | ICD-10-CM | POA: Insufficient documentation

## 2014-06-20 DIAGNOSIS — S069XAA Unspecified intracranial injury with loss of consciousness status unknown, initial encounter: Secondary | ICD-10-CM | POA: Insufficient documentation

## 2014-06-20 MED ORDER — IRBESARTAN 150 MG PO TABS
150.0000 mg | ORAL_TABLET | Freq: Two times a day (BID) | ORAL | Status: DC
  Administered 2014-06-20 – 2014-06-21 (×2): 150 mg via ORAL
  Filled 2014-06-20 (×2): qty 1

## 2014-06-20 NOTE — Progress Notes (Signed)
Physical Therapy Treatment Patient Details Name: [name] MRN: [id number] DOB: May 15, 1920 Today's Date: 06/20/2014    History of Present Illness 81 yo female with PMH AFIB, Carotid Stenosis and HTN. S/p fall. Head CT Scan reveals small acute R thalamic infarction and small frontal parasagittal SDH without mass effect    PT Comments    Patient seen for mobility session, eager to participate. Patient with noted improvements in activity tolerance and mobility this session compared to evaluation. Patient demonstrating ability to recognize midline posture and self correct. Patient also required less assist for ambulation with improvements in overall gait dysfunction. Patient continues to require cues for facilitation during gait but responds well with good carry over. At this time, continue to feel patient is an ideal candidate for comprehensive rehab to maximize recovery. Will continue to see and progress as tolerated.   Follow Up Recommendations  CIR;Supervision/Assistance - 24 hour     Equipment Recommendations  Other (comment) (tbd)    Recommendations for Other Services Rehab consult     Precautions / Restrictions Precautions Precautions: Fall    Mobility  Bed Mobility Overal bed mobility: Needs Assistance Bed Mobility: Supine to Sit;Sit to Supine     Supine to sit: Mod assist;HOB elevated Sit to supine: Mod assist   General bed mobility comments: increased time to come to EOB with HOB elevated, VCs for use of self assist with stronger extremities  Transfers Overall transfer level: Needs assistance Equipment used: Rolling walker (2 wheeled) Transfers: Sit to/from Stand Sit to Stand: Mod assist         General transfer comment: cues for posture lateral lean R and upright posture. Pt needed (A) blocking L LE  Ambulation/Gait Ambulation/Gait assistance: Mod assist Ambulation Distance (Feet): 40 Feet (broken into 4 trials of 10 ft with cues X2) Assistive device: 1  person hand held assist;2 person hand held assist Gait Pattern/deviations: Step-to pattern;Decreased step length - left;Decreased stance time - left;Decreased dorsiflexion - left;Decreased weight shift to right;Narrow base of support Gait velocity: decreased   General Gait Details: Patient with noted improvements in gait this session, improved ability to advance LLE but continued to required VCs and tactile faciliatation of weight shift with fatigue. Patient with multiple instances of self correction for upright posture.    Stairs            Wheelchair Mobility    Modified Rankin (Stroke Patients Only)       Balance Overall balance assessment: Needs assistance Sitting-balance support: Feet supported Sitting balance-Leahy Scale: Poor (to fair) Sitting balance - Comments: Patient with improvements of postural alignment and ability to recognize midline and self correct. Patient able to sit self supported without assist.  Postural control: Left lateral lean Standing balance support: Bilateral upper extremity supported Standing balance-Leahy Scale: Poor                      Cognition Arousal/Alertness: Awake/alert Behavior During Therapy: WFL for tasks assessed/performed Overall Cognitive Status: Within Functional Limits for tasks assessed                      Exercises Other Exercises Other Exercises: Pt with reaching task R Ue using cup for motor planning     General Comments        Pertinent Vitals/Pain Pain Assessment: No/denies pain    Home Living Family/patient expects to be discharged to:: Private residence Living Arrangements: Alone Available Help at Discharge: Family Type of Home:  House Home Access: Level entry   Home Layout: Bed/bath upstairs Home Equipment: Cane - single point      Prior Function Level of Independence: Independent      Comments: occassional use of cane; stopped driving on birthday last year   PT Goals (current  goals can now be found in the care plan section) Acute Rehab PT Goals Patient Stated Goal: to get back to my home and be independent PT Goal Formulation: With patient/family Time For Goal Achievement: 07/01/14 Potential to Achieve Goals: Good Progress towards PT goals: Progressing toward goals    Frequency  Min 4X/week    PT Plan Current plan remains appropriate    Co-evaluation             End of Session Equipment Utilized During Treatment: Gait belt Activity Tolerance: Patient tolerated treatment well Patient left: in bed;with call bell/phone within reach;with family/visitor present     Time: [id number] PT Time Calculation (min) (ACUTE ONLY): 17 min  Charges:  $Gait Training: 8-22 mins                    G CodesFabio Asa:      Keyera Hattabaugh J 06/20/2014, 5:00 PM  Charlotte Crumbevon Lissa Rowles, PT DPT  [phone]

## 2014-06-20 NOTE — Clinical Social Work Placement (Signed)
   CLINICAL SOCIAL WORK PLACEMENT  NOTE  Date:  06/20/2014  Patient Details  Name: [name] MRN: [id number] Date of Birth: 12/18/1920  Clinical Social Work is seeking post-discharge placement for this patient at the Skilled  Nursing Facility level of care (*CSW will initial, date and re-position this form in  chart as items are completed):  Yes   Patient/family provided with [facility] Social Work Department's list of facilities offering this level of care within the geographic area requested by the patient (or if unable, by the patient's family).  Yes   Patient/family informed of their freedom to choose among providers that offer the needed level of care, that participate in Medicare, Medicaid or managed care program needed by the patient, have an available bed and are willing to accept the patient.  Yes   Patient/family informed of [facility]'s ownership interest in [facility] and [facility], as well as of the fact that they are under no obligation to receive care at these facilities.  PASRR submitted to EDS on 06/20/14     PASRR number received on 06/20/14     Existing PASRR number confirmed on       FL2 transmitted to all facilities in geographic area requested by pt/family on 06/20/14     FL2 transmitted to all facilities within larger geographic area on       Patient informed that his/her managed care company has contracts with or will negotiate with certain facilities, including the following:            Patient/family informed of bed offers received.  Patient chooses bed at       Physician recommends and patient chooses bed at      Patient to be transferred to   on  .  Patient to be transferred to facility by       Patient family notified on   of transfer.  Name of family member notified:        PHYSICIAN Please sign FL2     Additional Comment:    _______________________________________________ Vaughan BrownerNixon, Kiley Torrence A, LCSW 06/20/2014, 3:20  PM

## 2014-06-20 NOTE — Progress Notes (Signed)
Occupational Therapy Treatment Patient Details Name: [name] MRN: [id number] DOB: [birth year] Today's Date: 06/20/2014    History of present illness 81 yo female with PMH AFIB, Carotid Stenosis and HTN. S/p fall. Head CT Scan reveals small acute R thalamic infarction and small frontal parasagittal SDH without mass effect   OT comments  Pt demonstrates ataxic movement with L UE and with structured activity able to move L UE in controlled movement. Pt demonstrates balance deficits with dyanamic and static sitting / standing activities.    Follow Up Recommendations  CIR;Supervision/Assistance - 24 hour    Equipment Recommendations  Other (comment)    Recommendations for Other Services      Precautions / Restrictions Precautions Precautions: Fall       Mobility Bed Mobility Overal bed mobility: Needs Assistance Bed Mobility: Supine to Sit;Sit to Supine     Supine to sit: Mod assist;HOB elevated Sit to supine: Mod assist   General bed mobility comments: Pt required (A) to progress L side to EOB and weight shift with lateral lean. pt required (A) for sit<>supine with L LE to return to bed surface  Transfers Overall transfer level: Needs assistance Equipment used: Rolling walker (2 wheeled) Transfers: Sit to/from Stand Sit to Stand: Mod assist         General transfer comment: cues for posture lateral lean R and upright posture. Pt needed (A) blocking L LE    Balance Overall balance assessment: Needs assistance Sitting-balance support: Bilateral upper extremity supported;Feet supported Sitting balance-Leahy Scale: Poor     Standing balance support: Bilateral upper extremity supported;During functional activity Standing balance-Leahy Scale: Poor                     ADL Overall ADL's : Needs assistance/impaired                                       General ADL Comments: Session focused on static sitting, dynamic sitting balance , basic  transfer, and static standing. Pt required verbal cues and (A) for bed mobility ( see below). pt once at eob therapist sitting to patients L side to allow tactile input of shoulders touching to cue patient to correct posture. Pt needed mod cues initially to correct. Pt with incr time able to lateral lean R to correct posture at eob. pt with forward flexion of hipsand needed cues for elongation of spine. pt performed reaching task at EOB to challenge balance once static sitting was becoming easier. pt lateral leaning L with all dynamic challenges. Pt basic transfer with L Le blocked. pt shifting weight R and L with L LE buckling noted. Pt with lateral lean L and stepping with R foot to weight bear through L LE. Pt completing this several times ot work on static standing. Pt stepping 3 steps forward and 3 stesp backward with L LE blocked.       Vision                     Perception     Praxis      Cognition   Behavior During Therapy: [facility] for tasks assessed/performed Overall Cognitive Status: Within Functional Limits for tasks assessed                       Extremity/Trunk Assessment  Upper Extremity Assessment Upper Extremity Assessment: LUE deficits/detail  Exercises Other Exercises Other Exercises: Pt with reaching task R Ue using cup for motor planning    Shoulder Instructions       General Comments      Pertinent Vitals/ Pain       Pain Assessment: No/denies pain  Home Living Family/patient expects to be discharged to:: Private residence Living Arrangements: Alone Available Help at Discharge: Family Type of Home: House Home Access: Level entry     Home Layout: Bed/bath upstairs               Home Equipment: Gilmer MorCane - single point      Lives With: Alone    Prior Functioning/Environment Level of Independence: Independent        Comments: occassional use of cane; stopped driving on birthday last year   Frequency Min 3X/week      Progress Toward Goals  OT Goals(current goals can now be found in the care plan section)  Progress towards OT goals: Progressing toward goals  Acute Rehab OT Goals Patient Stated Goal: to get back to my home and be independent OT Goal Formulation: With patient Time For Goal Achievement: 07/03/14 Potential to Achieve Goals: Good ADL Goals Pt Will Perform Grooming: with supervision;standing Pt Will Perform Upper Body Bathing: with set-up;with supervision;sitting Pt Will Perform Upper Body Dressing: with set-up;with supervision;sitting Pt Will Transfer to Toilet: with min assist;stand pivot transfer;bedside commode Pt Will Perform Toileting - Clothing Manipulation and hygiene: with min assist;sit to/from stand Pt/caregiver will Perform Home Exercise Program: Increased strength;Left upper extremity;With Supervision  Plan Discharge plan remains appropriate    Co-evaluation                 End of Session Equipment Utilized During Treatment: Gait belt;Rolling walker   Activity Tolerance Patient tolerated treatment well   Patient Left in bed;with call bell/phone within reach;with bed alarm set;with family/visitor present   Nurse Communication Mobility status;Other (comment)        Time: [id number] OT Time Calculation (min): 26 min  Charges: OT General Charges $OT Visit: 1 Procedure OT Treatments $Therapeutic Activity: 23-37 mins  Harolyn RutherfordJones, Danijah Noh B 06/20/2014, 3:23 PM  Pager: [phone]

## 2014-06-20 NOTE — Consult Note (Signed)
Physical Medicine and Rehabilitation Consult Reason for Consult: Bilateral basal ganglia ICH with parafalcine traumatic subdural hematoma and left temporal SAH Referring Physician: Dr.Xu   HPI: [name] is a 81 y.o. right hand female with history of hypertension, atrial fibrillation maintained on aspirin therapy, coronary artery disease. Patient lives alone independently with single-point cane prior to admission. Admitted 06/16/2014 after a fall down a set of steps and do her yard. Denied loss of consciousness. Posterior scalp laceration. Family also noted left facial droop. Cranial CT scan shows small acute right thalamic infarct as well as small right frontal parasagittal subdural hematoma without mass effect. Blood pressure elevated 205/63. Placed on Cardene drip for BP management. Echocardiogram with ejection fraction of 60% grade 1 diastolic dysfunction. Latest follow-up cranial CT scan 06/18/2014 shows evolving intraparenchymal hematomas, largest in right thalamus measuring 15 x 10 mm. Stable multifocal subdural hematomas measuring up to 5 mm along the right falx. Neurology follow-up with conservative care. Patient tolerating a regular consistency diet. Physical therapy evaluation completed 06/17/2014 with recommendations of physical medicine rehabilitation consult.   Review of Systems  Cardiovascular: Positive for palpitations and leg swelling.  Musculoskeletal: Positive for back pain.  Psychiatric/Behavioral: Positive for memory loss.  All other systems reviewed and are negative.  Past Medical History  Diagnosis Date  . Atrial fibrillation with rapid ventricular response Feb 2010  . Hypertension   . Flash pulmonary edema Feb 2010    Due to atrial fib with RVR  . Aortic insufficiency Feb 2010    Mild to moderate per echo with normal EF  . Arthritis   . Hyperlipidemia   . Dementia   . Vitamin D deficiency   . IBS (irritable bowel syndrome)   . PUD (peptic ulcer disease)    . Compression fracture   . Raynaud phenomenon   . CAD (coronary artery disease)     2010.  60% RCA, 60 - 70% LAD  . Carotid stenosis     40 - 59% bilateral   Past Surgical History  Procedure Laterality Date  . Cardiac catheterization  03/11/2008    EF 45%  . Appendectomy    . Transthoracic echocardiogram  03/06/2008    EF 55%   Family History  Problem Relation Age of Onset  . Stroke Mother   . Cancer Mother   . Heart attack Father    Social History:  reports that she has never smoked. She does not have any smokeless tobacco history on file. She reports that she does not drink alcohol or use illicit drugs. Allergies:  Allergies  Allergen Reactions  . Aspirin     REACTION: Intolerance  . Zoledronic Acid     REACTION: Intolerance   Medications Prior to Admission  Medication Sig Dispense Refill  . amiodarone (PACERONE) 200 MG tablet TAKE 1/2 TABLET BY MOUTH EVERY DAY 30 tablet 6  . aspirin EC 81 MG tablet Take 81 mg by mouth daily.      [name] Kitchen atorvastatin (LIPITOR) 20 MG tablet Take 20 mg by mouth daily.    . citalopram (CELEXA) 20 MG tablet Take 20 mg by mouth daily.     [name] Kitchen denosumab (PROLIA) 60 MG/ML SOLN injection Inject 60 mg into the skin every 6 (six) months. Administer in upper arm, thigh, or abdomen    . ibuprofen (ADVIL,MOTRIN) 200 MG tablet Take 400 mg by mouth every 6 (six) hours as needed for mild pain.    . nitroGLYCERIN (NITRODUR - DOSED  IN MG/24 HR) 0.4 mg/hr patch REMOVE OLD PATCH AND APPLY NEW PATCH EVERY NIGHT 30 patch 4  . valsartan (DIOVAN) 160 MG tablet one table every day    . Vitamin D, Ergocalciferol, (DRISDOL) 50000 UNITS CAPS Take 50,000 Units by mouth every 7 (seven) days.        Home: Home Living Family/patient expects to be discharged to:: Private residence Living Arrangements: Alone Available Help at Discharge: Family Type of Home: House Home Access: Level entry Home Layout: Bed/bath upstairs Home Equipment: Gilmer Mor - single point  Lives With:  Alone  Functional History: Prior Function Level of Independence: Independent Comments: occassional use of cane; stopped driving on birthday last year Functional Status:  Mobility: Bed Mobility Overal bed mobility: Needs Assistance Bed Mobility: Supine to Sit Supine to sit: Mod assist General bed mobility comments: Pt used UEs on bed rail to pull self up. Able to bring LEs to EOB but required mod A to rotate hips with use of bed pad and elevate trunk to sitting. Assist to scoot to EOB.  Transfers Overall transfer level: Needs assistance Equipment used: Rolling walker (2 wheeled) Transfers: Sit to/from Stand, [employer] Transfers Sit to Stand: Mod assist Stand pivot transfers: Mod assist General transfer comment: VC's for hand placement. Mod A to stand and balance. Left lateral lean in standing.  Ambulation/Gait Ambulation/Gait assistance: Mod assist, Max assist Ambulation Distance (Feet): 18 Feet Assistive device: 1 person hand held assist General Gait Details: Patient required 1 person wrap around support with gait belt. VCs for upright posture and faciliatation of weight shift and LLE advancement Gait Pattern/deviations: Step-to pattern, Decreased step length - left, Decreased stance time - left, Decreased dorsiflexion - left, Decreased weight shift to right, Narrow base of support Gait velocity: decreased    ADL: ADL Overall ADL's : Needs assistance/impaired Eating/Feeding: Set up, Sitting Eating/Feeding Details (indicate cue type and reason): assist to open containers Grooming: Set up, Sitting Grooming Details (indicate cue type and reason): pt actively using LUE to wipe face and is highly motivated.  Upper Body Bathing: Minimal assitance, Sitting Lower Body Bathing: Moderate assistance, Sit to/from stand Upper Body Dressing : Moderate assistance, Sitting Lower Body Dressing: Maximal assistance, Sit to/from stand Toilet Transfer: Moderate assistance, Stand-pivot, RW,  BSC Toileting- Clothing Manipulation and Hygiene: Maximal assistance, Sit to/from stand Functional mobility during ADLs: Moderate assistance, Rolling walker General ADL Comments: Pt is highly motivated to return to independence and participated fully during session. Sat EOB for about 10 minutes to facilitate trunk control. Pt presents with posterior and left lateral lean and is able to state which way she is leaning. Responds well to VC's to correct but only able to hold upright position for a few seconds before drifting posterior/lateral. Pt participated in dynamic reaching activities sitting EOB using LUE. Good scapular movement with slightly weaker shoulder elevation than Right. Pt stood with Mod A  and was noted to have strong lateral and lean in standing. Pt also had loose stool. Pt stood for several minutes with min A to balance for pericare then transferred to [facility]. Pt does present with some left visual field cut (to be further assessed)   Cognition: Cognition Overall Cognitive Status: Within Functional Limits for tasks assessed Arousal/Alertness: Awake/alert (keeps eyes closed) Orientation Level: Oriented X4 Attention: Sustained Sustained Attention: Appears intact Memory:  (will assess further) Awareness: Appears intact Problem Solving: Appears intact Safety/Judgment: Appears intact Cognition Arousal/Alertness: Awake/alert Behavior During Therapy: WFL for tasks assessed/performed Overall Cognitive Status: Within Functional  Limits for tasks assessed  Blood pressure 143/46, pulse 70, temperature 97.7 F (36.5 C), temperature source Axillary, resp. rate 18, height 5\' 6"  (1.676 m), weight 50.6 kg (111 lb 8.8 oz), SpO2 96 %. Physical Exam  HENT:  Scalp laceration with staples in place  Eyes:  Pupils reactive to light  Neck: Normal range of motion. Neck supple. No thyromegaly present.  Cardiovascular:  Cardiac rate controlled  Respiratory: Effort normal and breath sounds normal. No  respiratory distress.  GI: Soft. Bowel sounds are normal. She exhibits no distension.  Neurological: She is alert.  Patient is oriented to person, place and date of birth. Told me month, day of week and year. Very alert.  Follows simple commands. She could not recall of events of her fall. Fair awareness of deficits. LUE and LLE 4/5 deltoid,bicep, tricep, wrist, HF, KE and ankle. Trace sensory loss along left face and left leg and arm. No resting tremor or rigidity.   Psychiatric: She has a normal mood and affect. Her behavior is normal.    No results found for this or any previous visit (from the past 24 hour(s)). No results found.  Assessment/Plan: Diagnosis: TBI after fall, hx of  1. Does the need for close, 24 hr/day medical supervision in concert with the patient's rehab needs make it unreasonable for this patient to be served in a less intensive setting? Yes 2. Co-Morbidities requiring supervision/potential complications: AVD, htn, PAF, CAD 3. Due to bladder management, bowel management, safety, skin/wound care, disease management, medication administration, pain management and patient education, does the patient require 24 hr/day rehab nursing? Yes 4. Does the patient require coordinated care of a physician, rehab nurse, PT (1-2 hrs/day, 5 days/week), OT (1-2 hrs/day, 5 days/week) and SLP (1-2 hrs/day, 5 days/week) to address physical and functional deficits in the context of the above medical diagnosis(es)? Yes Addressing deficits in the following areas: balance, endurance, locomotion, strength, transferring, bowel/bladder control, bathing, dressing, feeding, grooming, toileting, cognition and psychosocial support 5. Can the patient actively participate in an intensive therapy program of at least 3 hrs of therapy per day at least 5 days per week? Yes 6. The potential for patient to make measurable gains while on inpatient rehab is excellent 7. Anticipated functional outcomes upon discharge  from inpatient rehab are supervision and min assist  with PT, supervision and min assist with OT, modified independent and supervision with SLP. 8. Estimated rehab length of stay to reach the above functional goals is: 16-22 days 9. Does the patient have adequate social supports and living environment to accommodate these discharge functional goals? Yes 10. Anticipated D/C setting: Home 11. Anticipated post D/C treatments: HH therapy and Outpatient therapy 12. Overall Rehab/Functional Prognosis: excellent  RECOMMENDATIONS: This patient's condition is appropriate for continued rehabilitative care in the following setting: CIR Patient has agreed to participate in recommended program. Yes Note that insurance prior authorization may be required for reimbursement for recommended care.  Comment: [name] was very active PTA. Was driving up until a few months ago. Takes care of her own home. Uses cane only outside of the house. Very motivated to improve and participate in our program. Daughter can help after discharge.   Rehab Admissions Coordinator to follow up.  Thanks,  Ranelle OysterZachary T. Dakwon Wenberg, MD, [place] DomFAAPMR     06/20/2014

## 2014-06-20 NOTE — PMR Pre-admission (Signed)
PMR Admission Coordinator Pre-Admission Assessment  Patient: [name] is an 81 y.o., female MRN: [id number] DOB: [birth year] Height:  (167.6 cm) Weight: 50.6 kg (111 lb 8.8 oz)              Insurance Information HMO: yes    PPO:      PCP:      IPA:      80/20:      OTHER: medicare  Advantage plan PRIMARY: [place]      Policy#: [id number]      Subscriber: pt CM Name: [name]   Phone#: [phone]     Fax#: onsite Pre-Cert#: [id number]      Employer: retired will be followed onsite Benefits:  Phone #: [phone]     Name: 06/20/14 Eff. Date: 01/28/14     Deduct: none      Out of Pocket Max: $4900      Life Max: none CIR: $345 copay per day days 1-5 then covers 100%      SNF: no copay days 1-20; $160 copay per day days 21-51; no copay days 52-100 Outpatient: $40 copay per visit     Co-Pay: no visit limit Home Health: 100%      Co-Pay: no visit limit DME: 80%     Co-Pay: 20% Providers: in network  SECONDARY: none       Medicaid Application Date:       Case Manager:  Disability Application Date:       Case Worker:   Emergency Conservator, museum/gallery Information    Name Relation Home Work [place] Daughter [phone]     [name] Grandaughter   [phone]     Current Medical History  Patient Admitting Diagnosis: 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 right hand female with history of hypertension, atrial fibrillation maintained on aspirin therapy, coronary artery disease. Patient lives alone independently with single-point cane prior to admission.   Admitted 06/16/2014 after a fall down a set of steps and do her yard. Denied loss of consciousness. Posterior scalp laceration. Family also noted left facial droop. Cranial CT scan shows small acute right thalamic infarct as well as small right frontal parasagittal subdural hematoma without mass effect. Blood  pressure elevated 205/63. Placed on Cardene drip for BP management. Echocardiogram with ejection fraction of 60% grade 1 diastolic dysfunction. Latest follow-up cranial CT scan 06/18/2014 shows evolving intraparenchymal hematomas, largest in right thalamus measuring 15 x 10 mm. Stable multifocal subdural hematomas measuring up to 5 mm along the right falx. Neurology follow-up with conservative care. Patient tolerating a regular consistency diet. Not on antithrombotic due to ICH.    Total: 1 NIH    Past Medical History  Past Medical History  Diagnosis Date  . Atrial fibrillation with rapid ventricular response Feb 2010  . Hypertension   . Flash pulmonary edema Feb 2010    Due to atrial fib with RVR  . Aortic insufficiency Feb 2010    Mild to moderate per echo with normal EF  . Arthritis   . Hyperlipidemia   . Dementia   . Vitamin D deficiency   . IBS (irritable bowel syndrome)   . PUD (peptic ulcer disease)   . Compression fracture   . Raynaud phenomenon   . CAD (coronary artery disease)     2010.  60% RCA, 60 - 70% LAD  . Carotid stenosis  40 - 59% bilateral    Family History  family history includes Cancer in her mother; Heart attack in her father; Stroke in her mother.  Prior Rehab/Hospitalizations: none  Current Medications   Current facility-administered medications:  .  acetaminophen (TYLENOL) tablet 650 mg, 650 mg, Oral, Q4H PRN, 650 mg at 06/17/14 0233 **OR** [DISCONTINUED] acetaminophen (TYLENOL) suppository 650 mg, 650 mg, Rectal, Q4H PRN, [name] .  amiodarone (PACERONE) tablet 100 mg, 100 mg, Oral, Daily, Marvel Plan, MD, 100 mg at 06/21/14 1014 .  citalopram (CELEXA) tablet 20 mg, 20 mg, Oral, Daily, Marvel Plan, MD, 20 mg at 06/21/14 1014 .  irbesartan (AVAPRO) tablet 150 mg, 150 mg, Oral, BID, Layne Benton, NP, 150 mg at 06/21/14 1014 .  labetalol (NORMODYNE,TRANDATE) injection 10-20 mg, 10-20 mg, Intravenous, Q2H PRN, Marvel Plan, MD, 10 mg at 06/20/14  0200 .  ondansetron (ZOFRAN) injection 4 mg, 4 mg, Intravenous, Q6H PRN, Kinnie Scales Rinehuls, PA-C, 4 mg at 06/17/14 1610 .  pantoprazole (PROTONIX) EC tablet 40 mg, 40 mg, Oral, Daily, Marvel Plan, MD, 40 mg at 06/21/14 1014  Patients Current Diet: Diet Heart Room service appropriate?: Yes; Fluid consistency:: Thin  Precautions / Restrictions Precautions Precautions: Fall Restrictions Weight Bearing Restrictions: No   Prior Activity Level Community (5-7x/wk): Independent. Not drive, cooks, cleans, own adls  Very self sufficient pta. Loves to read.  Home Assistive Devices / Equipment Home Assistive Devices/Equipment: Eyeglasses (hearing aids) Home Equipment: Cane - single point  Prior Functional Level Prior Function Level of Independence: Independent Comments: occassional use of cane; stopped driving on birthday last year  Current Functional Level Cognition  Arousal/Alertness: Awake/alert Overall Cognitive Status: Within Functional Limits for tasks assessed Orientation Level: Oriented X4 Attention: Sustained Sustained Attention: Appears intact Memory:  (will assess further) Awareness: Appears intact Problem Solving: Appears intact Safety/Judgment: Appears intact    Extremity Assessment (includes Sensation/Coordination)  Upper Extremity Assessment: LUE deficits/detail LUE Deficits / Details: generalized weakness. L shoulder flexion 3/5 (Full PROM and able to hold at full ROM before drift occurs). 4/5 throughout rest of LUE.  LUE Coordination: decreased gross motor, decreased fine motor  Lower Extremity Assessment: Defer to PT evaluation LLE Deficits / Details: LLE weakness noted 3/5 LLE Sensation: decreased light touch, decreased proprioception LLE Coordination: decreased fine motor, decreased gross motor    ADLs  Overall ADL's : Needs assistance/impaired Eating/Feeding: Set up, Sitting Eating/Feeding Details (indicate cue type and reason): assist to open  containers Grooming: Set up, Sitting Grooming Details (indicate cue type and reason): pt actively using LUE to wipe face and is highly motivated.  Upper Body Bathing: Minimal assitance, Sitting Lower Body Bathing: Moderate assistance, Sit to/from stand Upper Body Dressing : Moderate assistance, Sitting Lower Body Dressing: Maximal assistance, Sit to/from stand Toilet Transfer: Moderate assistance, Stand-pivot, RW, BSC Toileting- Clothing Manipulation and Hygiene: Maximal assistance, Sit to/from stand Functional mobility during ADLs: Moderate assistance, Rolling walker General ADL Comments: Session focused on static sitting, dynamic sitting balance , basic transfer, and static standing. Pt required verbal cues and (A) for bed mobility ( see below). pt once at eob therapist sitting to patients L side to allow tactile input of shoulders touching to cue patient to correct posture. Pt needed mod cues initially to correct. Pt with incr time able to lateral lean R to correct posture at eob. pt with forward flexion of hipsand needed cues for elongation of spine. pt performed reaching task at EOB to challenge balance once static sitting was becoming  easier. pt lateral leaning L with all dynamic challenges. Pt basic transfer with L Le blocked. pt shifting weight R and L with L LE buckling noted. Pt with lateral lean L and stepping with R foot to weight bear through L LE. Pt completing this several times ot work on static standing. Pt stepping 3 steps forward and 3 stesp backward with L LE blocked.     Mobility  Overal bed mobility: Needs Assistance Bed Mobility: Supine to Sit, Sit to Supine Supine to sit: Mod assist, HOB elevated Sit to supine: Mod assist General bed mobility comments: increased time to come to EOB with HOB elevated, VCs for use of self assist with stronger extremities    Transfers  Overall transfer level: Needs assistance Equipment used: Rolling walker (2 wheeled) Transfers: Sit to/from  Stand Sit to Stand: Mod assist Stand pivot transfers: Mod assist General transfer comment: cues for posture lateral lean R and upright posture. Pt needed (A) blocking L LE    Ambulation / Gait / Stairs / Wheelchair Mobility  Ambulation/Gait Ambulation/Gait assistance: Mod assist Ambulation Distance (Feet): 40 Feet (broken into 4 trials of 10 ft with cues X2) Assistive device: 1 person hand held assist, 2 person hand held assist General Gait Details: Patient with noted improvements in gait this session, improved ability to advance LLE but continued to required VCs and tactile faciliatation of weight shift with fatigue. Patient with multiple instances of self correction for upright posture.  Gait Pattern/deviations: Step-to pattern, Decreased step length - left, Decreased stance time - left, Decreased dorsiflexion - left, Decreased weight shift to right, Narrow base of support Gait velocity: decreased    Posture / Balance Dynamic Sitting Balance Sitting balance - Comments: Patient with improvements of postural alignment and ability to recognize midline and self correct. Patient able to sit self supported without assist.  Balance Overall balance assessment: Needs assistance Sitting-balance support: Feet supported Sitting balance-Leahy Scale: Poor (to fair) Sitting balance - Comments: Patient with improvements of postural alignment and ability to recognize midline and self correct. Patient able to sit self supported without assist.  Postural control: Left lateral lean Standing balance support: Bilateral upper extremity supported Standing balance-Leahy Scale: Poor Standing balance comment: Left lateral lean in standing. Mod A to maintain balance    Special needs/care consideration Skin staples to head lac Bowel mgmt: LBM 5/22 Bladder mgmt: continent but has urgency Wears glasses. Has B hearing aides    Previous Home Environment Living Arrangements: Alone  Lives With: Alone Available  Help at Discharge: Family, Available 24 hours/day (24/7 for few weeks only) Type of Home: House Home Layout: Two level, Able to live on main level with bedroom/bathroom (bedroom and bath downstairs but pt prefers upstairs) Alternate Level Stairs-Rails: Right Alternate Level Stairs-Number of Steps: 17 steps with a landing Home Access: Stairs to enter Entrance Stairs-Rails: Right, Left, Can reach both (back steps) Entrance Stairs-Number of Steps: 3 to 4 Bathroom Shower/Tub: Other (comment) (upstairs bathroom with clawfoot tub she bathes in ) Bathroom Toilet: Handicapped height Bathroom Accessibility: Yes How Accessible: Accessible via walker Home Care Services: No  Discharge Living Setting Plans for Discharge Living Setting: Patient's home, Alone Type of Home at Discharge: House Discharge Home Layout: Two level, Bed/bath upstairs Alternate Level Stairs-Rails: Right Alternate Level Stairs-Number of Steps: 17 steps with a landing Discharge Home Access: Stairs to enter Entrance Stairs-Rails: Right, Left, Can reach both (back entrance) Entrance Stairs-Number of Steps: 3 to 4 Discharge Bathroom Shower/Tub: Other (comment) (clawfoot tub  upstairs she wants to keep bathing in. has full) Discharge Bathroom Toilet: Handicapped height Discharge Bathroom Accessibility: Yes How Accessible: Accessible via walker Does the patient have any problems obtaining your medications?: No  Pt's dtr would like to try home d/c first. Eventually would like pt to consider ALF, but do wish to keep pt at home as long as able.  Social/Family/Support Systems Patient Roles: Parent, Other (Comment) (spouse died 3 years ago) Contact Information: Terrall LaityJoyce Freeman, daughter; only child Anticipated Caregiver: daughter, granddaughter and family Anticipated Caregiver's Contact Information: see above Ability/Limitations of Caregiver: dtr works as well as other family work. They can provide 24/7 assist for a few weeks at home,  but long term they may need to look into ALF Caregiver Availability: 24/7 Discharge Plan Discussed with Primary Caregiver: Yes Is Caregiver In Agreement with Plan?: Yes Does Caregiver/Family have Issues with Lodging/Transportation while Pt is in Rehab?: No  Goals/Additional Needs Patient/Family Goal for Rehab: supervision to min assist with PT, OT, and SLP Expected length of stay: [facility] 16-22 days Equipment Needs: B hearing aides. dtr to replace batteries Pt/Family Agrees to Admission and willing to participate: Yes Program Orientation Provided & Reviewed with Pt/Caregiver Including Roles  & Responsibilities: Yes  Decrease burden of Care through IP rehab admission: n/a  Possible need for SNF placement upon discharge:not anticipated. Daughter would like to have pt at home as long as possible but if 24/7 supervision needed long term, they would consider eventual ALF . They would like to d/c home directly from CIR and try home first before considering ALF or other options.  Patient Condition: This patient's condition remains as documented in the consult dated 06/20/2014, in which the Rehabilitation Physician determined and documented that the patient's condition is appropriate for intensive rehabilitative care in an inpatient rehabilitation facility. Will admit to inpatient rehab today.  Preadmission Screen Completed By:  Clois DupesBoyette, Mollye Guinta Godwin, 06/21/2014 11:14 AM ______________________________________________________________________   Discussed status with Dr. Riley KillSwartz on 06/21/14 at  1114 and received telephone approval for admission today.  Admission Coordinator:  Clois DupesBoyette, Davena Julian Godwin, time [id number] Date 06/21/2014.

## 2014-06-20 NOTE — H&P (Addendum)
Physical Medicine and Rehabilitation Admission H&P    Chief Complaint  Patient presents with  . Fall   HPI:  [name] is an 81 y.o. Female with history of CAD, HTN, Afib who was admitted on 06/16/14 after a fall while going down stairs. No LOC but patient with posterior scalp laceration and left facial droop noted by family. She was evaluated in ED and found to have right thalamic hemorrhage with additional small hemorrhagic foci around right thalamic hematoma, parafalcine hematoma, trace SAH along right falx and  small SDH left temporal lobe.  Serial CCT done for monitoring revealing stable hematoma.  2D echo done revealing EF 55-60% with severe calcification AV with mild to moderate regurgitation and moderately calcified MV. Dr. Roda Shutters following for input and ASA d/c due to ICH. Patient with spontaneous R-BG hemorrhage and at hight risk of bleeding if on regular AC.    Review of Systems  HENT: Positive for hearing loss.   Eyes: Negative for blurred vision and double vision.  Respiratory: Negative for cough and shortness of breath.        DOE  Cardiovascular: Positive for palpitations (on rare occasions). Negative for chest pain.  Gastrointestinal: Negative for heartburn, nausea, diarrhea and constipation.  Genitourinary: Negative for dysuria and urgency.  Musculoskeletal: Positive for neck pain. Negative for myalgias and back pain.  Neurological: Positive for focal weakness (LUE weakness ). Negative for dizziness and headaches.  Psychiatric/Behavioral: The patient does not have insomnia.       Past Medical History  Diagnosis Date  . Atrial fibrillation with rapid ventricular response Feb 2010  . Hypertension   . Flash pulmonary edema Feb 2010    Due to atrial fib with RVR  . Aortic insufficiency Feb 2010    Mild to moderate per echo with normal EF  . Arthritis   . Hyperlipidemia   . Dementia   . Vitamin D deficiency   . IBS (irritable bowel syndrome)   . PUD (peptic ulcer  disease)   . Compression fracture   . Raynaud phenomenon   . CAD (coronary artery disease)     2010.  60% RCA, 60 - 70% LAD  . Carotid stenosis     40 - 59% bilateral    Past Surgical History  Procedure Laterality Date  . Cardiac catheterization  03/11/2008    EF 45%  . Appendectomy    . Transthoracic echocardiogram  03/06/2008    EF 55%    Family History  Problem Relation Age of Onset  . Stroke Mother   . Cancer Mother   . Heart attack Father     Social History:  Lives alone.  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 Independent with use of cane when out of home setting. Does her cooking, cleaning and manages her finances. She reports that she has never smoked. She does not have any smokeless tobacco history on file. She reports that she does not drink alcohol or use illicit drugs.    Allergies  Allergen Reactions  . Aspirin     REACTION: Intolerance  . Zoledronic Acid     REACTION: Intolerance    Medications Prior to Admission  Medication Sig Dispense Refill  . amiodarone (PACERONE) 200 MG tablet TAKE 1/2 TABLET BY MOUTH EVERY DAY 30 tablet 6  . aspirin EC 81 MG tablet Take 81 mg by mouth daily.      [name] Kitchen atorvastatin (LIPITOR) 20 MG tablet Take 20 mg by mouth daily.    [name] Kitchen  citalopram (CELEXA) 20 MG tablet Take 20 mg by mouth daily.     [name] Kitchen denosumab (PROLIA) 60 MG/ML SOLN injection Inject 60 mg into the skin every 6 (six) months. Administer in upper arm, thigh, or abdomen    . ibuprofen (ADVIL,MOTRIN) 200 MG tablet Take 400 mg by mouth every 6 (six) hours as needed for mild pain.    . nitroGLYCERIN (NITRODUR - DOSED IN MG/24 HR) 0.4 mg/hr patch REMOVE OLD PATCH AND APPLY NEW PATCH EVERY NIGHT 30 patch 4  . valsartan (DIOVAN) 160 MG tablet one table every day    . Vitamin D, Ergocalciferol, (DRISDOL) 50000 UNITS CAPS Take 50,000 Units by mouth every 7 (seven) days.        Home: Home Living Family/patient expects to be discharged to:: Private residence Living  Arrangements: Alone Available Help at Discharge: Family, Available 24 hours/day, Other (Comment) (can provide 24/7 assist for a few weeks. Want to try home fi) Type of Home: House Home Access: Level entry Home Layout: Two level, Bed/bath upstairs Home Equipment: Gilmer Mor - single point  Lives With: Alone   Functional History: Prior Function Level of Independence: Independent Comments: occassional use of cane; stopped driving on birthday last year  Functional Status:  Mobility: Bed Mobility Overal bed mobility: Needs Assistance Bed Mobility: Supine to Sit, Sit to Supine Supine to sit: Mod assist, HOB elevated Sit to supine: Mod assist General bed mobility comments: increased time to come to EOB with HOB elevated, VCs for use of self assist with stronger extremities Transfers Overall transfer level: Needs assistance Equipment used: Rolling walker (2 wheeled) Transfers: Sit to/from Stand Sit to Stand: Mod assist Stand pivot transfers: Mod assist General transfer comment: cues for posture lateral lean R and upright posture. Pt needed (A) blocking L LE Ambulation/Gait Ambulation/Gait assistance: Mod assist Ambulation Distance (Feet): 40 Feet (broken into 4 trials of 10 ft with cues X2) Assistive device: 1 person hand held assist, 2 person hand held assist General Gait Details: Patient with noted improvements in gait this session, improved ability to advance LLE but continued to required VCs and tactile faciliatation of weight shift with fatigue. Patient with multiple instances of self correction for upright posture.  Gait Pattern/deviations: Step-to pattern, Decreased step length - left, Decreased stance time - left, Decreased dorsiflexion - left, Decreased weight shift to right, Narrow base of support Gait velocity: decreased    ADL: ADL Overall ADL's : Needs assistance/impaired Eating/Feeding: Set up, Sitting Eating/Feeding Details (indicate cue type and reason): assist to open  containers Grooming: Set up, Sitting Grooming Details (indicate cue type and reason): pt actively using LUE to wipe face and is highly motivated.  Upper Body Bathing: Minimal assitance, Sitting Lower Body Bathing: Moderate assistance, Sit to/from stand Upper Body Dressing : Moderate assistance, Sitting Lower Body Dressing: Maximal assistance, Sit to/from stand Toilet Transfer: Moderate assistance, Stand-pivot, RW, BSC Toileting- Clothing Manipulation and Hygiene: Maximal assistance, Sit to/from stand Functional mobility during ADLs: Moderate assistance, Rolling walker General ADL Comments: Session focused on static sitting, dynamic sitting balance , basic transfer, and static standing. Pt required verbal cues and (A) for bed mobility ( see below). pt once at eob therapist sitting to patients L side to allow tactile input of shoulders touching to cue patient to correct posture. Pt needed mod cues initially to correct. Pt with incr time able to lateral lean R to correct posture at eob. pt with forward flexion of hipsand needed cues for elongation of spine. pt performed reaching  task at EOB to challenge balance once static sitting was becoming easier. pt lateral leaning L with all dynamic challenges. Pt basic transfer with L Le blocked. pt shifting weight R and L with L LE buckling noted. Pt with lateral lean L and stepping with R foot to weight bear through L LE. Pt completing this several times ot work on static standing. Pt stepping 3 steps forward and 3 stesp backward with L LE blocked.   Cognition: Cognition Overall Cognitive Status: Within Functional Limits for tasks assessed Arousal/Alertness: Awake/alert Orientation Level: Oriented X4 Attention: Sustained Sustained Attention: Appears intact Memory:  (will assess further) Awareness: Appears intact Problem Solving: Appears intact Safety/Judgment: Appears intact Cognition Arousal/Alertness: Awake/alert Behavior During Therapy: WFL for  tasks assessed/performed Overall Cognitive Status: Within Functional Limits for tasks assessed   Blood pressure 166/60, pulse 76, temperature 97.6 F (36.4 C), temperature source Oral, resp. rate 18, height 5\' 6"  (1.676 m), weight 50.6 kg (111 lb 8.8 oz), SpO2 94 %. Physical Exam  Nursing note and vitals reviewed. Constitutional: She is oriented to person, place, and time. She appears well-developed and well-nourished. No distress.  Thin elderly female. Fatigued appearing but NAD.  HENT:  Head: Normocephalic.  Right Ear: External ear normal.  Left Ear: External ear normal.  Posterior scalp with stapled laceration.  Eyes: Conjunctivae and EOM are normal. Pupils are equal, round, and reactive to light.  Neck: Normal range of motion. Neck supple. No JVD present. No tracheal deviation present. No thyromegaly present.  Cardiovascular: Normal rate.  An irregularly irregular rhythm present.  Murmur heard. Respiratory: Effort normal and breath sounds normal. No respiratory distress. She has no wheezes.  GI: Soft. Bowel sounds are normal. She exhibits no distension. There is no tenderness. There is no rebound.  Musculoskeletal: She exhibits no edema or tenderness.  Kyphosis noted.  Diffuse ecchymosis right forearm with healing abraded area on elbow.   Neurological: She is alert and oriented to person, place, and time.  Patient is oriented to person, place and date of birth. Told me month, day of week and year. Very alert, improved from yesterday. Follows simple commands. She could not recall of events of her fall. Fair awareness of deficits. Good attention LUE and LLE 4/5 deltoid,bicep, tricep, wrist, HF, KE and ankle. Trace sensory loss along left face and left leg and arm. No resting tremor or rigidity.   Skin: Skin is warm and dry. She is not diaphoretic.  Psychiatric: She has a normal mood and affect. Her speech is normal and behavior is normal. Cognition and memory are normal.    No  results found for this or any previous visit (from the past 48 hour(s)). No results found.     Medical Problem List and Plan: 1. Functional deficits secondary to TBI after fall  2.  DVT Prophylaxis/Anticoagulation: Mechanical: Sequential compression devices, below knee Bilateral lower extremities 3. Pain Management: Tylenol prn.  4. Mood: On Celexa daily. LCSW to follow for evaluation and support.  5. Neuropsych: This patient is capable of making decisions on her own behalf. 6. Skin/Wound Care: Routine pressure relief measures. Scalp laceration healing well--remove scalp staples later this week.   7. Fluids/Electrolytes/Nutrition: Monitor I/O. Check lytes in am.  Offer supplements between meals.  8. HTN: Monitor BP every 8 hours. Has been labile and medications increased to bid.  H/o orthostasis in the past--monitor for symptoms.  9. Dyslipidemia: On hold due to ICH.  Resume at discharge. 10. ABLA: Will add iron supplement.  11. PAF with h/o flash edema due to RVR:  On amiodarone. Monitor HR every 8 hours.  12. CAD: Has been managed medically without symptoms.  13. IBS:  Discussed ways to modify diet to help with constipation/diarrhea issues.    Post Admission Physician Evaluation: 1. Functional deficits secondary  to TBI after fall 2. Patient is admitted to receive collaborative, interdisciplinary care between the physiatrist, rehab nursing staff, and therapy team. 3. Patient's level of medical complexity and substantial therapy needs in context of that medical necessity cannot be provided at a lesser intensity of care such as a SNF. 4. Patient has experienced substantial functional loss from his/her baseline which was documented above under the "Functional History" and "Functional Status" headings.  Judging by the patient's diagnosis, physical exam, and functional history, the patient has potential for functional progress which will result in measurable gains while on inpatient rehab.   These gains will be of substantial and practical use upon discharge  in facilitating mobility and self-care at the household level. 5. Physiatrist will provide 24 hour management of medical needs as well as oversight of the therapy plan/treatment and provide guidance as appropriate regarding the interaction of the two. 6. 24 hour rehab nursing will assist with bladder management, bowel management, safety, skin/wound care, disease management, medication administration, pain management and patient education  and help integrate therapy concepts, techniques,education, etc. 7. PT will assess and treat for/with: Lower extremity strength, range of motion, stamina, balance, functional mobility, safety, adaptive techniques and equipment, visual-perceptual awareness, attention, family education, pain control, goals are: supervision. 8. OT will assess and treat for/with: ADL's, functional mobility, safety, upper extremity strength, adaptive techniques and equipment, NMR, visual-perceptual awareness, attention/focus, ego support, family ed.   Goals are: supervision. Therapy may proceed with showering this patient. 9. SLP will assess and treat for/with: cognition, communication, speech.  Goals are: mod I. 10. Case Management and Social Worker will assess and treat for psychological issues and discharge planning. 11. Team conference will be held weekly to assess progress toward goals and to determine barriers to discharge. 12. Patient will receive at least 3 hours of therapy per day at least 5 days per week. 13. ELOS: 16-22 days       14. Prognosis:  excellent     Ranelle OysterZachary T. Swartz, MD, [facility] DesertFAAPMR [place] [facility] 06/21/2014   06/21/2014

## 2014-06-20 NOTE — Progress Notes (Signed)
STROKE TEAM PROGRESS NOTE   SUBJECTIVE (INTERVAL HISTORY) The patient's daughter is present today. Per daughter, rehab doctor told her mother she had Parkinson's - pt and daughter were not aware of diagnoses. Per Dr. Roda Shutters, no known signs or symptoms of Parkinson's.  OBJECTIVE Temp:  [97.5 F (36.4 C)-99.3 F (37.4 C)] 98.2 F (36.8 C) (05/23 1019) Pulse Rate:  [66-78] 66 (05/23 1019) Cardiac Rhythm:  [-] Normal sinus rhythm (05/22 2015) Resp:  [18-20] 18 (05/23 0942) BP: (143-183)/(46-69) 170/53 mmHg (05/23 1019) SpO2:  [94 %-96 %] 95 % (05/23 0942)  No results for input(s): GLUCAP in the last 168 hours.  Recent Labs Lab 06/16/14 1802 06/17/14 0828 06/18/14 0249 06/19/14 0348  NA 137 133* 139 137  K 5.2* 3.9 4.0 3.9  CL 103 103 106 103  CO2 GLUCOSE 104* 118* 102* 102*  BUN CREATININE 0.90 0.61 0.60 0.53  CALCIUM 8.8* 7.8* 8.2* 8.4*    Recent Labs Lab 06/16/14 1802  AST 43*  ALT 24  ALKPHOS 77  BILITOT 0.9  PROT 7.2  ALBUMIN 3.7    Recent Labs Lab 06/16/14 1802 06/17/14 0828 06/18/14 0249 06/19/14 0348  WBC 7.7 10.6* 8.2 9.9  NEUTROABS 4.1  --   --   --   HGB 10.9* 9.2* 9.2* 9.3*  HCT 34.4* 28.4* 29.1* 29.2*  MCV 93.7 92.5 93.9 94.2  PLT 193 181 171 184   No results for input(s): CKTOTAL, CKMB, CKMBINDEX, TROPONINI in the last 168 hours. No results for input(s): LABPROT, INR in the last 72 hours. No results for input(s): COLORURINE, LABSPEC, PHURINE, GLUCOSEU, HGBUR, BILIRUBINUR, KETONESUR, PROTEINUR, UROBILINOGEN, NITRITE, LEUKOCYTESUR in the last 72 hours.  Invalid input(s): APPERANCEUR     Component Value Date/Time   CHOL 118 06/17/2014 0828   TRIG 96 06/17/2014 0828   HDL 37* 06/17/2014 0828   CHOLHDL 3.2 06/17/2014 0828   VLDL 19 06/17/2014 0828   LDLCALC 62 06/17/2014 0828   Lab Results  Component Value Date   HGBA1C 5.9* 06/17/2014   No results found for: LABOPIA, COCAINSCRNUR, LABBENZ, AMPHETMU, THCU, LABBARB   No results for input(s): ETH in the last 168 hours.   Ct Head Wo Contrast  06/18/2014   IMPRESSION  Evolving intraparenchymal hematomas, largest in RIGHT thalamus measuring 15 x 10 mm. Stable multifocal subdural hematomas measuring up to 5 mm along the RIGHT falx. Trace subarachnoid blood in LEFT sylvian fissure. involutional changes. Moderate white matter changes most consistent chronic small vessel ischemic disease.  06/17/2014   IMPRESSION: Persistent changes as described above. There is been slight increase in the size of right thalamic and right parafalcine hematomas as described above.   06/16/2014   IMPRESSION: Positive for intracranial hemorrhage. Largest hemorrhagic focus is located in the right thalamus. There are additional parenchymal hemorrhages in the left white matter tract. The right thalamic hemorrhage may be the cause for the patient's falling and the other areas of hemorrhage could be posttraumatic. In addition, there is evidence for extra-axial blood along the anterior falx and along the left temporal lobe which is compatible with small subdural hematomas.  Atrophy and extensive white matter disease. No evidence for midline shift.  Multilevel degenerative changes in cervical spine without acute bone abnormality.    Chest Port 1 View  06/17/2014   IMPRESSION: COPD with biapical parenchymal scarring likely secondary to previous granulomatous infection. There is no acute cardiopulmonary abnormality.   06/16/2014   IMPRESSION:  COPD changes with biapical scarring and calcification.  Enlargement of cardiac silhouette.  No acute abnormalities or interval change.   2D Echocardiogram  - Left ventricle: The cavity size was normal. Wall thickness wasnormal. Systolic function was normal. The estimated ejectionfraction was in the range of 55% to 60%. Doppler parameters areconsistent with abnormal left ventricular relaxation (grade 1diastolic dysfunction). - Aortic valve: Severely calcified  non coronary cusp. There wasmild stenosis. There was mild to moderate regurgitation. Valvearea (VTI): 1.56 cm^2. Valve area (Vmax): 1.26 cm^2. Valve area(Vmean): 1.39 cm^2. - Mitral valve: Calcified annulus. Moderately thickened leaflets . - Left atrium: The atrium was moderately dilated. - Atrial septum: No defect or patent foramen ovale was identified.  EKG  normal sinus rhythm. For complete results please see formal report.   PHYSICAL EXAM  General - Well nourished, well developed, in no apparent distress.  Ophthalmologic - fundi not visualized due to noncooperation.  Cardiovascular - Regular rate and rhythm.  Neck - supple, no carotid bruits  Mental Status -  Level of arousal and orientation to time, place, and person were intact. Language including expression, naming, repetition, comprehension was assessed and found intact. Attention span and concentration were normal, able to calculate.  Cranial Nerves II - XII - II - Visual field intact OU. III, IV, VI - Extraocular movements intact. V - Facial sensation intact bilaterally. VII - left mild nasolabial fold flattening. VIII - Hard of hearing & vestibular intact bilaterally. X - Palate elevates symmetrically. XI - Chin turning & shoulder shrug intact bilaterally. XII - Tongue protrusion intact.  Motor Strength - The patient's strength was 5-/5 LUE and 5/5 LLE and no pronator drift.  5/5 RUE and RLE. Bulk was normal and fasciculations were absent.   Motor Tone - Muscle tone was assessed at the neck and appendages and was normal.  Reflexes - The patient's reflexes were symmetrical in all extremities and she had bilateral positive Babinski.  Sensory - Light touch, temperature/pinprick were assessed and were symmetrical.    Coordination - The patient had normal movements in the right hand with no ataxia or dysmetria.  Tremor was absent.  Gait and Station - deferred due to safety concerns.   ASSESSMENT/PLAN Ms. SAYANA SALLEY is a 81 y.o. female with history of hypertension, hyperlipidemia, A. fib not on [facility], CAD admitted for ICH and SDH after fall. Symptoms stable.    Bilateral BG ICH with parafalcine SDH and left temporal SAH - SDH/SAH likely traumatic due to fall  CT head repeat showed stable hematoma and SDH/SAH.  Repeat CT in 1 month to confirm hemorrhage resolution  2D Echo  EF 55-60%  LDL 62  HgbA1c 5.9  SCDs for VTE prophylaxis  Diet Heart Room service appropriate?: Yes; Fluid consistency:: Thin   aspirin 81 mg orally every day prior to admission, now on no antithrombotic due to ICH  Ongoing aggressive stroke risk factor management  Therapy recommendations:  CIR  Disposition:  CIR. Admission's coordinator following. Insurance approval pending.  Afib  On amiodarone  Rate controlled  Home aspirin discontinued due to ICH  Currently she is not candidate for [facility]. However, due to her right BG spontaneous hemorrhage in the setting of BP under control, she will have higher risk of bleeding if on regular AC in the future. Risk and benefit discussed with pt and her daughter.  Hypertension  Home meds:   Losartan  BP goal < 160  Currently on irbesartan  Ongoing fluctuations - as high as  180s/80s  Will increase irbesartan to bid  Patient counseled to be compliant with her blood pressure medications  Hyperlipidemia  Home meds:  Lipitor 20   LDL 62, goal < 70  Lipitor on hold due to ICH  Resume statin at discharge  Other Stroke Risk Factors  Advanced age  Coronary artery disease  Other Active Problems  Mild anemia - stable  Mild leukocytosis - resolved  Hospital day # 4  Rhoderick MoodyBIBY,SHARON  [facility] See Amion for Pager information 06/20/2014 11:01 AM   I, the attending vascular neurologist, have personally obtained a history, examined the patient, evaluated laboratory data, individually viewed imaging studies and agree with radiology interpretations. I also  discussed with daughter and Dr. Riley KillSwartz regarding her care plan. Together with the NP/PA, we formulated the assessment and plan of care which reflects our mutual decision.  I have made any additions or clarifications directly to the above note and agree with the findings and plan as currently documented.   81 year old female Was admitted for right basal ganglia bleeding with traumatic subdural hematoma and left basal ganglia traumatic small bleed. Clinically stable, making improvement. Waiting for CIR basement.   Marvel PlanJindong Adelita Hone, MD PhD Stroke Neurology 06/20/2014 4:33 PM To contact Stroke Continuity provider, please refer to [email or web address]. After hours, contact General Neurology

## 2014-06-20 NOTE — Clinical Social Work Note (Signed)
Clinical Social Work Assessment  Patient Details  Name: [name] MRN: [id number] Date of Birth: Oct 22, 1920  Date of referral:  06/20/14               Reason for consult:  Facility Placement                Permission sought to share information with:  Case Manager, Facility Sport and exercise psychologist, Family Supports, PCP Permission granted to share information::  Yes, Verbal Permission Granted  Name::      Angelina Sheriff )  Agency::   (Yes, SNF's )  Relationship::   (Daughter )  Contact Information:   [phone])  Housing/Transportation Living arrangements for the past 2 months:  [place] of Information:  Patient (Family (Granddaughter)) Patient Interpreter Needed:  None Criminal Activity/Legal Involvement Pertinent to Current Situation/Hospitalization:  No - Comment as needed Significant Relationships:  Adult Children, Other Family Members Lives with:  Self Do you feel safe going back to the place where you live?  Yes Need for family participation in patient care:  No (Coment)  Care giving concerns:  No care giving concerns reported at this time.   Social Worker assessment / plan:  Holiday representative met with patient and pt's granddaughter present at bedside in reference to post-acute placement for SNF. CSW introduced CSW role and SNF process. SNF discussed as an alternative plan for CIR in the event insurance does not approve IP REHAB admission. CSW reviewed and provided SNF list. Pt's granddaughter reported pt has living independently for most of her life and has never required SNF care.  Pt's granddaughter further reported pt's family works closely with [facility] and family would prefer placement at [facility] in the event insurance does NOT approve CIR. No further questions/concerns reported at this time. CSW will continue to follow pt and pt's family for continued support and to facilitate pt's discharge needs if denied insurance approval for CIR.    Employment status:  Retired Nurse, adult PT Recommendations:  Inpatient [place] / Referral to community resources:  [facility]  Patient/Family's Response to care:  Pt in bed resting comfortably during assessment. Pt's granddaughter sitting at bedside and requested that pt's information is sent to [facility] for review. Pt's family accepting of SNF placement and agreeable to placement only at [facility] Pt's granddaughter appreciated social work intervention.   Patient/Family's Understanding of and Emotional Response to Diagnosis, Current Treatment, and Prognosis: Pt's granddaughter understanding of current treatment and that will requires a higher level of care once discharged from the hospital.   Emotional Assessment Appearance:  Appears stated age Attitude/Demeanor/Rapport:   (Pleasant ) Affect (typically observed):  Accepting, Pleasant, Appropriate, Calm Orientation:  Oriented to Self, Oriented to Place, Oriented to  Time, Oriented to Situation Alcohol / Substance use:  Never Used Psych involvement (Current and /or in the community):  No (Comment)  Discharge Needs  Concerns to be addressed:  No discharge needs identified Readmission within the last 30 days:  No Current discharge risk:  None Barriers to Discharge:  No Barriers Identified   Rozell Searing, LCSW 06/20/2014, 2:14 PM

## 2014-06-20 NOTE — Progress Notes (Signed)
I met with pt, daughter and family at bedside. We discussed an inpt rehab admission and they are in agreement. I have begun insurance approval with [facility] and await their decision. Likely decision by tomorrow, but will be ready to admit today if insurance facilitates approval earlier. I will update RN CM and SW. [phone]

## 2014-06-21 ENCOUNTER — Inpatient Hospital Stay (HOSPITAL_COMMUNITY)
Admission: RE | Admit: 2014-06-21 | Discharge: 2014-07-06 | DRG: 949 | Disposition: A | Discharge status: Skilled Nursing Facility | Payer: Medicare Other | Source: Intra-hospital | Attending: Physical Medicine & Rehabilitation | Admitting: Physical Medicine & Rehabilitation

## 2014-06-21 DIAGNOSIS — S069X0D Unspecified intracranial injury without loss of consciousness, subsequent encounter: Principal | ICD-10-CM

## 2014-06-21 DIAGNOSIS — Z79899 Other long term (current) drug therapy: Secondary | ICD-10-CM

## 2014-06-21 DIAGNOSIS — Z888 Allergy status to other drugs, medicaments and biological substances status: Secondary | ICD-10-CM | POA: Diagnosis not present

## 2014-06-21 DIAGNOSIS — I7300 Raynaud's syndrome without gangrene: Secondary | ICD-10-CM | POA: Diagnosis present

## 2014-06-21 DIAGNOSIS — I480 Paroxysmal atrial fibrillation: Secondary | ICD-10-CM | POA: Diagnosis present

## 2014-06-21 DIAGNOSIS — Z886 Allergy status to analgesic agent status: Secondary | ICD-10-CM | POA: Diagnosis not present

## 2014-06-21 DIAGNOSIS — E785 Hyperlipidemia, unspecified: Secondary | ICD-10-CM | POA: Diagnosis present

## 2014-06-21 DIAGNOSIS — S06890S Other specified intracranial injury without loss of consciousness, sequela: Secondary | ICD-10-CM | POA: Diagnosis not present

## 2014-06-21 DIAGNOSIS — D62 Acute posthemorrhagic anemia: Secondary | ICD-10-CM | POA: Diagnosis present

## 2014-06-21 DIAGNOSIS — S069XAA Unspecified intracranial injury with loss of consciousness status unknown, initial encounter: Secondary | ICD-10-CM | POA: Diagnosis present

## 2014-06-21 DIAGNOSIS — S06893S Other specified intracranial injury with loss of consciousness of 1 hour to 5 hours 59 minutes, sequela: Secondary | ICD-10-CM | POA: Diagnosis not present

## 2014-06-21 DIAGNOSIS — R0602 Shortness of breath: Secondary | ICD-10-CM | POA: Diagnosis present

## 2014-06-21 DIAGNOSIS — Z791 Long term (current) use of non-steroidal anti-inflammatories (NSAID): Secondary | ICD-10-CM

## 2014-06-21 DIAGNOSIS — F0390 Unspecified dementia without behavioral disturbance: Secondary | ICD-10-CM | POA: Diagnosis present

## 2014-06-21 DIAGNOSIS — I2510 Atherosclerotic heart disease of native coronary artery without angina pectoris: Secondary | ICD-10-CM | POA: Diagnosis present

## 2014-06-21 DIAGNOSIS — I10 Essential (primary) hypertension: Secondary | ICD-10-CM | POA: Diagnosis present

## 2014-06-21 DIAGNOSIS — I25810 Atherosclerosis of coronary artery bypass graft(s) without angina pectoris: Secondary | ICD-10-CM | POA: Diagnosis present

## 2014-06-21 DIAGNOSIS — S06890D Other specified intracranial injury without loss of consciousness, subsequent encounter: Secondary | ICD-10-CM | POA: Diagnosis not present

## 2014-06-21 DIAGNOSIS — M1990 Unspecified osteoarthritis, unspecified site: Secondary | ICD-10-CM | POA: Diagnosis present

## 2014-06-21 DIAGNOSIS — K589 Irritable bowel syndrome without diarrhea: Secondary | ICD-10-CM | POA: Diagnosis present

## 2014-06-21 DIAGNOSIS — Z7982 Long term (current) use of aspirin: Secondary | ICD-10-CM

## 2014-06-21 DIAGNOSIS — R0902 Hypoxemia: Secondary | ICD-10-CM

## 2014-06-21 DIAGNOSIS — S069X9A Unspecified intracranial injury with loss of consciousness of unspecified duration, initial encounter: Secondary | ICD-10-CM | POA: Diagnosis present

## 2014-06-21 DIAGNOSIS — F419 Anxiety disorder, unspecified: Secondary | ICD-10-CM | POA: Diagnosis present

## 2014-06-21 MED ORDER — FLEET ENEMA 7-19 GM/118ML RE ENEM: 1.0000 | ENEMA | Freq: Once | RECTAL | Status: AC | PRN

## 2014-06-21 MED ORDER — PROCHLORPERAZINE EDISYLATE 5 MG/ML IJ SOLN: 5.0000 mg | Freq: Four times a day (QID) | INTRAMUSCULAR | Status: DC | PRN

## 2014-06-21 MED ORDER — PROCHLORPERAZINE MALEATE 5 MG PO TABS
5.0000 mg | ORAL_TABLET | Freq: Four times a day (QID) | ORAL | Status: DC | PRN
  Filled 2014-06-21: qty 2

## 2014-06-21 MED ORDER — ACETAMINOPHEN 325 MG PO TABS
325.0000 mg | ORAL_TABLET | ORAL | Status: DC | PRN
  Administered 2014-06-29: 650 mg via ORAL
  Filled 2014-06-21: qty 2

## 2014-06-21 MED ORDER — BISACODYL 10 MG RE SUPP: 10.0000 mg | Freq: Every day | RECTAL | Status: DC | PRN

## 2014-06-21 MED ORDER — AMIODARONE HCL 100 MG PO TABS
100.0000 mg | ORAL_TABLET | Freq: Every day | ORAL | Status: DC
  Administered 2014-06-22 – 2014-07-06 (×15): 100 mg via ORAL
  Filled 2014-06-21 (×16): qty 1

## 2014-06-21 MED ORDER — GUAIFENESIN-DM 100-10 MG/5ML PO SYRP: 5.0000 mL | ORAL_SOLUTION | Freq: Four times a day (QID) | ORAL | Status: DC | PRN

## 2014-06-21 MED ORDER — PROCHLORPERAZINE 25 MG RE SUPP: 12.5000 mg | Freq: Four times a day (QID) | RECTAL | Status: DC | PRN

## 2014-06-21 MED ORDER — CITALOPRAM HYDROBROMIDE 20 MG PO TABS
20.0000 mg | ORAL_TABLET | Freq: Every day | ORAL | Status: DC
  Administered 2014-06-22 – 2014-06-30 (×9): 20 mg via ORAL
  Filled 2014-06-21 (×11): qty 1

## 2014-06-21 MED ORDER — ALUM & MAG HYDROXIDE-SIMETH 200-200-20 MG/5ML PO SUSP: 30.0000 mL | ORAL | Status: DC | PRN

## 2014-06-21 MED ORDER — TRAZODONE HCL 50 MG PO TABS
25.0000 mg | ORAL_TABLET | Freq: Every evening | ORAL | Status: DC | PRN
  Administered 2014-06-22: 50 mg via ORAL
  Administered 2014-07-03 – 2014-07-04 (×2): 25 mg via ORAL
  Filled 2014-06-21 (×3): qty 1

## 2014-06-21 MED ORDER — PANTOPRAZOLE SODIUM 40 MG PO TBEC
40.0000 mg | DELAYED_RELEASE_TABLET | Freq: Every day | ORAL | Status: DC
  Administered 2014-06-22 – 2014-07-06 (×15): 40 mg via ORAL
  Filled 2014-06-21 (×15): qty 1

## 2014-06-21 MED ORDER — IRBESARTAN 150 MG PO TABS
150.0000 mg | ORAL_TABLET | Freq: Two times a day (BID) | ORAL | Status: DC
  Administered 2014-06-21 – 2014-07-06 (×30): 150 mg via ORAL
  Filled 2014-06-21 (×32): qty 1

## 2014-06-21 MED ORDER — DOCUSATE SODIUM 100 MG PO CAPS
100.0000 mg | ORAL_CAPSULE | Freq: Every day | ORAL | Status: DC | PRN
  Administered 2014-07-01 – 2014-07-06 (×2): 100 mg via ORAL
  Filled 2014-06-21 (×3): qty 1

## 2014-06-21 NOTE — Progress Notes (Signed)
PMR Admission Coordinator Pre-Admission Assessment  Patient: [name] is an 81 y.o., female MRN: [id number] DOB: 09/09/1920 Height: 5\' 6"  (167.6 cm) Weight: 50.6 kg (111 lb 8.8 oz)  Insurance Information HMO: yes PPO: PCP: IPA: 80/20: OTHER: medicare Advantage plan PRIMARY: [place] medicare Policy#: [id number] Subscriber: pt CM Name: Kathleene HazelShanon Stacy Phone#: [phone] Fax#: onsite Pre-Cert#: [id number] Employer: retired will be followed onsite Benefits: Phone #: [phone] Name: 06/20/14 Eff. Date: 01/28/14 Deduct: none Out of Pocket Max: $4900 Life Max: none CIR: $345 copay per day days 1-5 then covers 100% SNF: no copay days 1-20; $160 copay per day days 21-51; no copay days 52-100 Outpatient: $40 copay per visit Co-Pay: no visit limit Home Health: 100% Co-Pay: no visit limit DME: 80% Co-Pay: 20% Providers: in network  SECONDARY: none  Medicaid Application Date: Case Manager:  Disability Application Date: Case Worker:   Emergency Conservator, museum/galleryContact Information Contact Information    Name Relation Home Work [place]   Freeman,Joyce Daughter [phone]     Tolentino,Christian Grandaughter   [phone]     Current Medical History  Patient Admitting Diagnosis: 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 right hand female with history of hypertension, atrial fibrillation maintained on aspirin therapy, coronary artery disease. Patient lives alone independently with single-point cane prior to admission.   Admitted 06/16/2014 after a fall down a set of steps and do her yard. Denied loss of consciousness. Posterior scalp laceration. Family also noted left facial  droop. Cranial CT scan shows small acute right thalamic infarct as well as small right frontal parasagittal subdural hematoma without mass effect. Blood pressure elevated 205/63. Placed on Cardene drip for BP management. Echocardiogram with ejection fraction of 60% grade 1 diastolic dysfunction. Latest follow-up cranial CT scan 06/18/2014 shows evolving intraparenchymal hematomas, largest in right thalamus measuring 15 x 10 mm. Stable multifocal subdural hematomas measuring up to 5 mm along the right falx. Neurology follow-up with conservative care. Patient tolerating a regular consistency diet. Not on antithrombotic due to ICH.   Total: 1 NIH    Past Medical History  Past Medical History  Diagnosis Date  . Atrial fibrillation with rapid ventricular response Feb 2010  . Hypertension   . Flash pulmonary edema Feb 2010    Due to atrial fib with RVR  . Aortic insufficiency Feb 2010    Mild to moderate per echo with normal EF  . Arthritis   . Hyperlipidemia   . Dementia   . Vitamin D deficiency   . IBS (irritable bowel syndrome)   . PUD (peptic ulcer disease)   . Compression fracture   . Raynaud phenomenon   . CAD (coronary artery disease)     2010. 60% RCA, 60 - 70% LAD  . Carotid stenosis     40 - 59% bilateral    Family History  family history includes Cancer in her mother; Heart attack in her father; Stroke in her mother.  Prior Rehab/Hospitalizations: none  Current Medications   Current facility-administered medications:  . acetaminophen (TYLENOL) tablet 650 mg, 650 mg, Oral, Q4H PRN, 650 mg at 06/17/14 0233 **OR** [DISCONTINUED] acetaminophen (TYLENOL) suppository 650 mg, 650 mg, Rectal, Q4H PRN, Noel Christmasharles Stewart . amiodarone (PACERONE) tablet 100 mg, 100 mg, Oral, Daily, Marvel PlanJindong Xu, MD, 100 mg at 06/21/14 1014 . citalopram (CELEXA) tablet 20 mg, 20 mg, Oral, Daily, Marvel PlanJindong Xu, MD, 20 mg at 06/21/14 1014 .  irbesartan (AVAPRO)  tablet 150 mg, 150 mg, Oral, BID, Layne Benton, NP, 150 mg at 06/21/14 1014 . labetalol (NORMODYNE,TRANDATE) injection 10-20 mg, 10-20 mg, Intravenous, Q2H PRN, Marvel Plan, MD, 10 mg at 06/20/14 0200 . ondansetron (ZOFRAN) injection 4 mg, 4 mg, Intravenous, Q6H PRN, Kinnie Scales Rinehuls, PA-C, 4 mg at 06/17/14 1610 . pantoprazole (PROTONIX) EC tablet 40 mg, 40 mg, Oral, Daily, Marvel Plan, MD, 40 mg at 06/21/14 1014  Patients Current Diet: Diet Heart Room service appropriate?: Yes; Fluid consistency:: Thin  Precautions / Restrictions Precautions Precautions: Fall Restrictions Weight Bearing Restrictions: No   Prior Activity Level Community (5-7x/wk): Independent. Not drive, cooks, cleans, own adls  Very self sufficient pta. Loves to read.  Home Assistive Devices / Equipment Home Assistive Devices/Equipment: Eyeglasses (hearing aids) Home Equipment: Cane - single point  Prior Functional Level Prior Function Level of Independence: Independent Comments: occassional use of cane; stopped driving on birthday last year  Current Functional Level Cognition  Arousal/Alertness: Awake/alert Overall Cognitive Status: Within Functional Limits for tasks assessed Orientation Level: Oriented X4 Attention: Sustained Sustained Attention: Appears intact Memory: (will assess further) Awareness: Appears intact Problem Solving: Appears intact Safety/Judgment: Appears intact   Extremity Assessment (includes Sensation/Coordination)  Upper Extremity Assessment: LUE deficits/detail LUE Deficits / Details: generalized weakness. L shoulder flexion 3/5 (Full PROM and able to hold at full ROM before drift occurs). 4/5 throughout rest of LUE.  LUE Coordination: decreased gross motor, decreased fine motor  Lower Extremity Assessment: Defer to PT evaluation LLE Deficits / Details: LLE weakness noted 3/5 LLE Sensation: decreased light touch, decreased proprioception LLE  Coordination: decreased fine motor, decreased gross motor    ADLs  Overall ADL's : Needs assistance/impaired Eating/Feeding: Set up, Sitting Eating/Feeding Details (indicate cue type and reason): assist to open containers Grooming: Set up, Sitting Grooming Details (indicate cue type and reason): pt actively using LUE to wipe face and is highly motivated.  Upper Body Bathing: Minimal assitance, Sitting Lower Body Bathing: Moderate assistance, Sit to/from stand Upper Body Dressing : Moderate assistance, Sitting Lower Body Dressing: Maximal assistance, Sit to/from stand Toilet Transfer: Moderate assistance, Stand-pivot, RW, BSC Toileting- Clothing Manipulation and Hygiene: Maximal assistance, Sit to/from stand Functional mobility during ADLs: Moderate assistance, Rolling walker General ADL Comments: Session focused on static sitting, dynamic sitting balance , basic transfer, and static standing. Pt required verbal cues and (A) for bed mobility ( see below). pt once at eob therapist sitting to patients L side to allow tactile input of shoulders touching to cue patient to correct posture. Pt needed mod cues initially to correct. Pt with incr time able to lateral lean R to correct posture at eob. pt with forward flexion of hipsand needed cues for elongation of spine. pt performed reaching task at EOB to challenge balance once static sitting was becoming easier. pt lateral leaning L with all dynamic challenges. Pt basic transfer with L Le blocked. pt shifting weight R and L with L LE buckling noted. Pt with lateral lean L and stepping with R foot to weight bear through L LE. Pt completing this several times ot work on static standing. Pt stepping 3 steps forward and 3 stesp backward with L LE blocked.     Mobility  Overal bed mobility: Needs Assistance Bed Mobility: Supine to Sit, Sit to Supine Supine to sit: Mod assist, HOB elevated Sit to supine: Mod assist General bed mobility comments:  increased time to come to EOB with HOB elevated, VCs for use of self assist with  stronger extremities    Transfers  Overall transfer level: Needs assistance Equipment used: Rolling walker (2 wheeled) Transfers: Sit to/from Stand Sit to Stand: Mod assist Stand pivot transfers: Mod assist General transfer comment: cues for posture lateral lean R and upright posture. Pt needed (A) blocking L LE    Ambulation / Gait / Stairs / Wheelchair Mobility  Ambulation/Gait Ambulation/Gait assistance: Mod assist Ambulation Distance (Feet): 40 Feet (broken into 4 trials of 10 ft with cues X2) Assistive device: 1 person hand held assist, 2 person hand held assist General Gait Details: Patient with noted improvements in gait this session, improved ability to advance LLE but continued to required VCs and tactile faciliatation of weight shift with fatigue. Patient with multiple instances of self correction for upright posture.  Gait Pattern/deviations: Step-to pattern, Decreased step length - left, Decreased stance time - left, Decreased dorsiflexion - left, Decreased weight shift to right, Narrow base of support Gait velocity: decreased    Posture / Balance Dynamic Sitting Balance Sitting balance - Comments: Patient with improvements of postural alignment and ability to recognize midline and self correct. Patient able to sit self supported without assist.  Balance Overall balance assessment: Needs assistance Sitting-balance support: Feet supported Sitting balance-Leahy Scale: Poor (to fair) Sitting balance - Comments: Patient with improvements of postural alignment and ability to recognize midline and self correct. Patient able to sit self supported without assist.  Postural control: Left lateral lean Standing balance support: Bilateral upper extremity supported Standing balance-Leahy Scale: Poor Standing balance comment: Left lateral lean in standing. Mod A to maintain balance    Special  needs/care consideration Skin staples to head lac Bowel mgmt: LBM 5/22 Bladder mgmt: continent but has urgency Wears glasses. Has B hearing aides    Previous Home Environment Living Arrangements: Alone Lives With: Alone Available Help at Discharge: Family, Available 24 hours/day (24/7 for few weeks only) Type of Home: House Home Layout: Two level, Able to live on main level with bedroom/bathroom (bedroom and bath downstairs but pt prefers upstairs) Alternate Level Stairs-Rails: Right Alternate Level Stairs-Number of Steps: 17 steps with a landing Home Access: Stairs to enter Entrance Stairs-Rails: Right, Left, Can reach both (back steps) Entrance Stairs-Number of Steps: 3 to 4 Bathroom Shower/Tub: Other (comment) (upstairs bathroom with clawfoot tub she bathes in ) Bathroom Toilet: Handicapped height Bathroom Accessibility: Yes How Accessible: Accessible via walker Home Care Services: No  Discharge Living Setting Plans for Discharge Living Setting: Patient's home, Alone Type of Home at Discharge: House Discharge Home Layout: Two level, Bed/bath upstairs Alternate Level Stairs-Rails: Right Alternate Level Stairs-Number of Steps: 17 steps with a landing Discharge Home Access: Stairs to enter Entrance Stairs-Rails: Right, Left, Can reach both (back entrance) Entrance Stairs-Number of Steps: 3 to 4 Discharge Bathroom Shower/Tub: Other (comment) (clawfoot tub upstairs she wants to keep bathing in. has full) Discharge Bathroom Toilet: Handicapped height Discharge Bathroom Accessibility: Yes How Accessible: Accessible via walker Does the patient have any problems obtaining your medications?: No  Pt's dtr would like to try home d/c first. Eventually would like pt to consider ALF, but do wish to keep pt at home as long as able.  Social/Family/Support Systems Patient Roles: Parent, Other (Comment) (spouse died 3 years ago) Contact Information: Terrall Laity, daughter; only  child Anticipated Caregiver: daughter, granddaughter and family Anticipated Caregiver's Contact Information: see above Ability/Limitations of Caregiver: dtr works as well as other family work. They can provide 24/7 assist for a few weeks at home, but long  term they may need to look into ALF Caregiver Availability: 24/7 Discharge Plan Discussed with Primary Caregiver: Yes Is Caregiver In Agreement with Plan?: Yes Does Caregiver/Family have Issues with Lodging/Transportation while Pt is in Rehab?: No  Goals/Additional Needs Patient/Family Goal for Rehab: supervision to min assist with PT, OT, and SLP Expected length of stay: [facility] 16-22 days Equipment Needs: B hearing aides. dtr to replace batteries Pt/Family Agrees to Admission and willing to participate: Yes Program Orientation Provided & Reviewed with Pt/Caregiver Including Roles & Responsibilities: Yes  Decrease burden of Care through IP rehab admission: n/a  Possible need for SNF placement upon discharge:not anticipated. Daughter would like to have pt at home as long as possible but if 24/7 supervision needed long term, they would consider eventual ALF . They would like to d/c home directly from CIR and try home first before considering ALF or other options.  Patient Condition: This patient's condition remains as documented in the consult dated 06/20/2014, in which the Rehabilitation Physician determined and documented that the patient's condition is appropriate for intensive rehabilitative care in an inpatient rehabilitation facility. Will admit to inpatient rehab today.  Preadmission Screen Completed By: Clois Dupes, 06/21/2014 11:14 AM ______________________________________________________________________  Discussed status with Dr. Riley Kill on 06/21/14 at 1114 and received telephone approval for admission today.  Admission Coordinator: Clois Dupes, time 4098 Date 06/21/2014.          Cosigned by: Ranelle Oyster, MD at 06/21/2014 11:32 AM  Revision History     Date/Time User Provider Type Action   06/21/2014 11:32 AM Ranelle Oyster, MD Physician Cosign   06/21/2014 11:14 AM Standley Brooking, RN Rehab Admission Coordinator Sign

## 2014-06-21 NOTE — Care Management Note (Signed)
Case Management Note  Patient Details  Name: Mervyn Skeetersula W Fomby MRN: [id number] Date of Birth: 05/22/1920  Subjective/Objective:                    Action/Plan:   Expected Discharge Date:                  Expected Discharge Plan:  IP Rehab Facility (Awaiting f/u with CIR Coordinator)  In-House Referral:     Discharge planning Services  CM Consult  Post Acute Care Choice:  IP Rehab Choice offered to:     DME Arranged:    DME Agency:     HH Arranged:    HH Agency:     Status of Service:  Completed, signed off  Medicare Important Message Given:  Yes Date Medicare IM Given:  06/20/14 Medicare IM give by:  Louretta Parmarutchfield, Isidro Monks Date Additional Medicare IM Given:    Additional Medicare Important Message give by:     If discussed at Long Length of Stay Meetings, dates discussed:  06/21/2014  Additional Comments:  Yvone Neurutchfield, Simpson Paulos M, RN 06/21/2014, 3:11 PM

## 2014-06-21 NOTE — Progress Notes (Signed)
Pt transferred to Rehab from 4N05. Alert and orientated x 4. Discuss rehab expectations and therapy schedule with pt and family. Pt looking forward to starting therapy in the morning.

## 2014-06-21 NOTE — Discharge Summary (Signed)
Stroke Discharge Summary  Patient ID: malcolm hetz   MRN: [id number]      DOB: [birth year]  Date of Admission: 06/16/2014 Date of Discharge: 06/21/2014  Attending Physician:  Marvel Plan, MD, Stroke MD  Consulting Physician(s):    rehabilitation medicine Dr Riley Kill  Patient's PCP:  Ezequiel Kayser, MD  Discharge Diagnoses: Bilateral BG ICH with parafalcine SDH and left temporal SAH. Active Problems:   SDH (subdural hematoma)   Nontraumatic hemorrhage of cerebral hemisphere   Hypertensive urgency   Traumatic brain injury BMI  Body mass index is 18.01 kg/(m^2).  Past Medical History  Diagnosis Date  . Atrial fibrillation with rapid ventricular response Feb 2010  . Hypertension   . Flash pulmonary edema Feb 2010    Due to atrial fib with RVR  . Aortic insufficiency Feb 2010    Mild to moderate per echo with normal EF  . Arthritis   . Hyperlipidemia   . Dementia   . Vitamin D deficiency   . IBS (irritable bowel syndrome)   . PUD (peptic ulcer disease)   . Compression fracture   . Raynaud phenomenon   . CAD (coronary artery disease)     2010.  60% RCA, 60 - 70% LAD  . Carotid stenosis     40 - 59% bilateral   Past Surgical History  Procedure Laterality Date  . Cardiac catheterization  03/11/2008    EF 45%  . Appendectomy    . Transthoracic echocardiogram  03/06/2008    EF 55%    Medications to be continued on Rehab . amiodarone  100 mg Oral Daily  . citalopram  20 mg Oral Daily  . irbesartan  150 mg Oral BID  . pantoprazole  40 mg Oral Daily    LABORATORY STUDIES CBC    Component Value Date/Time   WBC 9.9 06/19/2014 0348   RBC 3.10* 06/19/2014 0348   HGB 9.3* 06/19/2014 0348   HCT 29.2* 06/19/2014 0348   PLT 184 06/19/2014 0348   MCV 94.2 06/19/2014 0348   MCH 30.0 06/19/2014 0348   MCHC 31.8 06/19/2014 0348   RDW 13.9 06/19/2014 0348   LYMPHSABS 3.0 06/16/2014 1802   MONOABS 0.5 06/16/2014 1802   EOSABS 0.1 06/16/2014 1802   BASOSABS 0.0 06/16/2014 1802    CMP    Component Value Date/Time   NA 137 06/19/2014 0348   K 3.9 06/19/2014 0348   CL 103 06/19/2014 0348   CO2 26 06/19/2014 0348   GLUCOSE 102* 06/19/2014 0348   BUN 11 06/19/2014 0348   CREATININE 0.53 06/19/2014 0348   CALCIUM 8.4* 06/19/2014 0348   PROT 7.2 06/16/2014 1802   ALBUMIN 3.7 06/16/2014 1802   AST 43* 06/16/2014 1802   ALT 24 06/16/2014 1802   ALKPHOS 77 06/16/2014 1802   BILITOT 0.9 06/16/2014 1802   GFRNONAA >60 06/19/2014 0348   GFRAA >60 06/19/2014 0348   COAGS Lab Results  Component Value Date   INR 1.18 06/16/2014   INR 1.4 03/05/2008   Lipid Panel    Component Value Date/Time   CHOL 118 06/17/2014 0828   TRIG 96 06/17/2014 0828   HDL 37* 06/17/2014 0828   CHOLHDL 3.2 06/17/2014 0828   VLDL 19 06/17/2014 0828   LDLCALC 62 06/17/2014 0828   HgbA1C  Lab Results  Component Value Date   HGBA1C 5.9* 06/17/2014   Cardiac Panel (last 3 results) No results for input(s): CKTOTAL, CKMB, TROPONINI, RELINDX in the last 72  hours. Urinalysis    Component Value Date/Time   COLORURINE YELLOW 06/16/2014 2000   APPEARANCEUR CLEAR 06/16/2014 2000   LABSPEC 1.012 06/16/2014 2000   PHURINE 6.5 06/16/2014 2000   GLUCOSEU NEGATIVE 06/16/2014 2000   HGBUR NEGATIVE 06/16/2014 2000   BILIRUBINUR NEGATIVE 06/16/2014 2000   KETONESUR NEGATIVE 06/16/2014 2000   PROTEINUR NEGATIVE 06/16/2014 2000   UROBILINOGEN 0.2 06/16/2014 2000   NITRITE NEGATIVE 06/16/2014 2000   LEUKOCYTESUR NEGATIVE 06/16/2014 2000   Urine Drug Screen No results found for: LABOPIA, COCAINSCRNUR, LABBENZ, AMPHETMU, THCU, LABBARB  Alcohol Level No results found for: [facility]   SIGNIFICANT DIAGNOSTIC STUDIES   Ct Head Wo Contrast  06/18/2014 IMPRESSION Evolving intraparenchymal hematomas, largest in RIGHT thalamus measuring 15 x 10 mm. Stable multifocal subdural hematomas measuring up to 5 mm along the RIGHT falx. Trace subarachnoid blood in LEFT sylvian fissure. involutional changes.  Moderate white matter changes most consistent chronic small vessel ischemic disease.  06/17/2014 IMPRESSION: Persistent changes as described above. There is been slight increase in the size of right thalamic and right parafalcine hematomas as described above.   06/16/2014 IMPRESSION: Positive for intracranial hemorrhage. Largest hemorrhagic focus is located in the right thalamus. There are additional parenchymal hemorrhages in the left white matter tract. The right thalamic hemorrhage may be the cause for the patient's falling and the other areas of hemorrhage could be posttraumatic. In addition, there is evidence for extra-axial blood along the anterior falx and along the left temporal lobe which is compatible with small subdural hematomas. Atrophy and extensive white matter disease. No evidence for midline shift. Multilevel degenerative changes in cervical spine without acute bone abnormality.   Chest Port 1 View  06/17/2014 IMPRESSION: COPD with biapical parenchymal scarring likely secondary to previous granulomatous infection. There is no acute cardiopulmonary abnormality.   06/16/2014 IMPRESSION: COPD changes with biapical scarring and calcification. Enlargement of cardiac silhouette. No acute abnormalities or interval change.   2D Echocardiogram - Left ventricle: The cavity size was normal. Wall thickness wasnormal. Systolic function was normal. The estimated ejectionfraction was in the range of 55% to 60%. Doppler parameters areconsistent with abnormal left ventricular relaxation (grade 1diastolic dysfunction). - Aortic valve: Severely calcified non coronary cusp. There wasmild stenosis. There was mild to moderate regurgitation. Valvearea (VTI): 1.56 cm^2. Valve area (Vmax): 1.26 cm^2. Valve area(Vmean): 1.39 cm^2. - Mitral valve: Calcified annulus. Moderately thickened leaflets . - Left atrium: The atrium was moderately dilated. - Atrial septum: No defect or patent  foramen ovale was identified.  EKG normal sinus rhythm. For complete results please see formal report.    HISTORY OF PRESENT ILLNES ELDANA ISIP is a 81 y.o. female with a history of hypertension, hyperlipidemia, atrial fibrillation,and coronary artery disease, brought to the emergency room following a fall at home. Patient was going down a set of steps into her yard when she fell. There was no loss of consciousness. Posterior scalp laceration was noted. Family members also noted a left facial droop. CT scan of her head showed a small acute right thalamic infarction as well as small right frontal parasagittal subdural hematoma without mass effect. NIH stroke score was 2 for mild left facial weakness and mild coordination difficulty of left upper extremity. Blood pressure was noted to be markedly elevated. She was started on Cardene drip for BP management.  LSN: 5 PM on 06/16/2014 tPA Given: No: Acute ICH and SDH   HOSPITAL COURSE Ms. KAMEE BOBST is a 81 y.o. female with history of  hypertension, hyperlipidemia, A. fib not on AC, CAD admitted for ICH and SDH after fall. Symptoms stable.    Bilateral BG ICH with parafalcine SDH and left temporal SAH - SDH/SAH likely traumatic due to fall  CT head repeat showed stable hematoma and SDH/SAH.  Repeat CT in 1 month to confirm hemorrhage resolution  2D Echo EF 55-60%  LDL 62  HgbA1c 5.9  SCDs for VTE prophylaxis  Diet Heart Room service appropriate?: Yes; Fluid consistency:: Thin   aspirin 81 mg orally every day prior to admission, now on no antithrombotic due to ICH  Ongoing aggressive stroke risk factor management  Therapy recommendations: CIR  Disposition: CIR. Admission's coordinator following. Insurance approval pending.  Afib  On amiodarone  Rate controlled  Home aspirin discontinued due to ICH  Currently she is not candidate for [facility]. However, due to her right BG spontaneous hemorrhage in the setting of BP under  control, she will have higher risk of bleeding if on regular AC in the future. Risk and benefit discussed with pt and her daughter.  Hypertension  Home meds: Losartan  BP goal < 160  Currently on irbesartan  Ongoing fluctuations - as high as 180s/80s  Will increase irbesartan to bid  Patient counseled to be compliant with her blood pressure medications  Hyperlipidemia  Home meds: Lipitor 20   LDL 62, goal < 70  Lipitor on hold due to ICH  Resume statin at discharge  Other Stroke Risk Factors  Advanced age  Coronary artery disease  Other Active Problems  Mild anemia - stable  Mild leukocytosis - resolved  DISCHARGE EXAM Blood pressure 166/60, pulse 76, temperature 97.6 F (36.4 C), temperature source Oral, resp. rate 18, height  (1.676 m), weight 50.6 kg (111 lb 8.8 oz), SpO2 94 %.  PHYSICAL EXAM  General - Well nourished, well developed, in no apparent distress.  Ophthalmologic - fundi not visualized due to noncooperation.  Cardiovascular - Regular rate and rhythm.  Neck - supple, no carotid bruits  Mental Status -  Level of arousal and orientation to time, place, and person were intact. Language including expression, naming, repetition, comprehension was assessed and found intact. Attention span and concentration were normal, able to calculate.  Cranial Nerves II - XII - II - Visual field intact OU. III, IV, VI - Extraocular movements intact. V - Facial sensation intact bilaterally. VII - left mild nasolabial fold flattening. VIII - Hard of hearing & vestibular intact bilaterally. X - Palate elevates symmetrically. XI - Chin turning & shoulder shrug intact bilaterally. XII - Tongue protrusion intact.  Motor Strength - The patient's strength was 5-/5 LUE and 5/5 LLE and no pronator drift. 5/5 RUE and RLE. Bulk was normal and fasciculations were absent.  Motor Tone - Muscle tone was assessed at the neck and appendages and was  normal.  Reflexes - The patient's reflexes were symmetrical in all extremities and she had bilateral positive Babinski.  Sensory - Light touch, temperature/pinprick were assessed and were symmetrical.   Coordination - The patient had normal movements in the right hand with no ataxia or dysmetria. Tremor was absent.  Gait and Station - deferred due to safety concerns.   Discharge Diet  Diet Heart Room service appropriate?: Yes; Fluid consistency:: Thin liquids  DISCHARGE PLAN  Disposition:  Transfer to [facility] for ongoing PT, OT and ST  no antithrombotic for secondary stroke prevention due to ICH.  Recommend ongoing risk factor control by Primary Care Physician  at time of discharge from inpatient rehabilitation.  Follow-up PERINI,MARK A, MD in 2 weeks following discharge from rehab.  Follow-up with Dr. Marvel PlanJindong Kendarius [facility] in 2 months.   25 minutes were spent preparing discharge.  Delton Seeavid Rinehuls PA-C [facility] Pager [phone] 06/21/2014, 9:41 AM  I, the attending vascular neurologist, have personally obtained a history, examined the patient, evaluated laboratory data, individually viewed imaging studies and agree with radiology interpretations. Together with the NP/PA, we formulated the assessment and plan of care which reflects our mutual decision.  I have made any additions or clarifications directly to the above note and agree with the findings and plan as currently documented.   Marvel PlanJindong Yordan Martindale, MD PhD Stroke Neurology 06/21/2014 11:34 PM

## 2014-06-21 NOTE — Progress Notes (Signed)
Physical Medicine and Rehabilitation Consult Reason for Consult: Bilateral basal ganglia ICH with parafalcine traumatic subdural hematoma and left temporal SAH Referring Physician: Dr.Xu   HPI: [name] is a 81 y.o. right hand female with history of hypertension, atrial fibrillation maintained on aspirin therapy, coronary artery disease. Patient lives alone independently with single-point cane prior to admission. Admitted 06/16/2014 after a fall down a set of steps and do her yard. Denied loss of consciousness. Posterior scalp laceration. Family also noted left facial droop. Cranial CT scan shows small acute right thalamic infarct as well as small right frontal parasagittal subdural hematoma without mass effect. Blood pressure elevated 205/63. Placed on Cardene drip for BP management. Echocardiogram with ejection fraction of 60% grade 1 diastolic dysfunction. Latest follow-up cranial CT scan 06/18/2014 shows evolving intraparenchymal hematomas, largest in right thalamus measuring 15 x 10 mm. Stable multifocal subdural hematomas measuring up to 5 mm along the right falx. Neurology follow-up with conservative care. Patient tolerating a regular consistency diet. Physical therapy evaluation completed 06/17/2014 with recommendations of physical medicine rehabilitation consult.   Review of Systems  Cardiovascular: Positive for palpitations and leg swelling.  Musculoskeletal: Positive for back pain.  Psychiatric/Behavioral: Positive for memory loss.  All other systems reviewed and are negative.  Past Medical History  Diagnosis Date  . Atrial fibrillation with rapid ventricular response Feb 2010  . Hypertension   . Flash pulmonary edema Feb 2010    Due to atrial fib with RVR  . Aortic insufficiency Feb 2010    Mild to moderate per echo with normal EF  . Arthritis   . Hyperlipidemia   . Dementia   . Vitamin D deficiency   . IBS (irritable bowel syndrome)     . PUD (peptic ulcer disease)   . Compression fracture   . Raynaud phenomenon   . CAD (coronary artery disease)     2010. 60% RCA, 60 - 70% LAD  . Carotid stenosis     40 - 59% bilateral   Past Surgical History  Procedure Laterality Date  . Cardiac catheterization  03/11/2008    EF 45%  . Appendectomy    . Transthoracic echocardiogram  03/06/2008    EF 55%   Family History  Problem Relation Age of Onset  . Stroke Mother   . Cancer Mother   . Heart attack Father    Social History:  reports that she has never smoked. She does not have any smokeless tobacco history on file. She reports that she does not drink alcohol or use illicit drugs. Allergies:  Allergies  Allergen Reactions  . Aspirin     REACTION: Intolerance  . Zoledronic Acid     REACTION: Intolerance   Medications Prior to Admission  Medication Sig Dispense Refill  . amiodarone (PACERONE) 200 MG tablet TAKE 1/2 TABLET BY MOUTH EVERY DAY 30 tablet 6  . aspirin EC 81 MG tablet Take 81 mg by mouth daily.     [name] Kitchen atorvastatin (LIPITOR) 20 MG tablet Take 20 mg by mouth daily.    . citalopram (CELEXA) 20 MG tablet Take 20 mg by mouth daily.     [name] Kitchen denosumab (PROLIA) 60 MG/ML SOLN injection Inject 60 mg into the skin every 6 (six) months. Administer in upper arm, thigh, or abdomen    . ibuprofen (ADVIL,MOTRIN) 200 MG tablet Take 400 mg by mouth every 6 (six) hours as needed for mild pain.    . nitroGLYCERIN (NITRODUR - DOSED IN MG/24 HR) 0.4 mg/hr  patch REMOVE OLD PATCH AND APPLY NEW PATCH EVERY NIGHT 30 patch 4  . valsartan (DIOVAN) 160 MG tablet one table every day    . Vitamin D, Ergocalciferol, (DRISDOL) 50000 UNITS CAPS Take 50,000 Units by mouth every 7 (seven) days.       Home: Home Living Family/patient expects to be discharged to:: Private residence Living Arrangements: Alone Available  Help at Discharge: Family Type of Home: House Home Access: Level entry Home Layout: Bed/bath upstairs Home Equipment: Gilmer Mor - single point Lives With: Alone  Functional History: Prior Function Level of Independence: Independent Comments: occassional use of cane; stopped driving on birthday last year Functional Status:  Mobility: Bed Mobility Overal bed mobility: Needs Assistance Bed Mobility: Supine to Sit Supine to sit: Mod assist General bed mobility comments: Pt used UEs on bed rail to pull self up. Able to bring LEs to EOB but required mod A to rotate hips with use of bed pad and elevate trunk to sitting. Assist to scoot to EOB.  Transfers Overall transfer level: Needs assistance Equipment used: Rolling walker (2 wheeled) Transfers: Sit to/from Stand, [employer] Transfers Sit to Stand: Mod assist Stand pivot transfers: Mod assist General transfer comment: VC's for hand placement. Mod A to stand and balance. Left lateral lean in standing.  Ambulation/Gait Ambulation/Gait assistance: Mod assist, Max assist Ambulation Distance (Feet): 18 Feet Assistive device: 1 person hand held assist General Gait Details: Patient required 1 person wrap around support with gait belt. VCs for upright posture and faciliatation of weight shift and LLE advancement Gait Pattern/deviations: Step-to pattern, Decreased step length - left, Decreased stance time - left, Decreased dorsiflexion - left, Decreased weight shift to right, Narrow base of support Gait velocity: decreased    ADL: ADL Overall ADL's : Needs assistance/impaired Eating/Feeding: Set up, Sitting Eating/Feeding Details (indicate cue type and reason): assist to open containers Grooming: Set up, Sitting Grooming Details (indicate cue type and reason): pt actively using LUE to wipe face and is highly motivated.  Upper Body Bathing: Minimal assitance, Sitting Lower Body Bathing: Moderate assistance, Sit to/from stand Upper Body  Dressing : Moderate assistance, Sitting Lower Body Dressing: Maximal assistance, Sit to/from stand Toilet Transfer: Moderate assistance, Stand-pivot, RW, BSC Toileting- Clothing Manipulation and Hygiene: Maximal assistance, Sit to/from stand Functional mobility during ADLs: Moderate assistance, Rolling walker General ADL Comments: Pt is highly motivated to return to independence and participated fully during session. Sat EOB for about 10 minutes to facilitate trunk control. Pt presents with posterior and left lateral lean and is able to state which way she is leaning. Responds well to VC's to correct but only able to hold upright position for a few seconds before drifting posterior/lateral. Pt participated in dynamic reaching activities sitting EOB using LUE. Good scapular movement with slightly weaker shoulder elevation than Right. Pt stood with Mod A and was noted to have strong lateral and lean in standing. Pt also had loose stool. Pt stood for several minutes with min A to balance for pericare then transferred to [facility]. Pt does present with some left visual field cut (to be further assessed)   Cognition: Cognition Overall Cognitive Status: Within Functional Limits for tasks assessed Arousal/Alertness: Awake/alert (keeps eyes closed) Orientation Level: Oriented X4 Attention: Sustained Sustained Attention: Appears intact Memory: (will assess further) Awareness: Appears intact Problem Solving: Appears intact Safety/Judgment: Appears intact Cognition Arousal/Alertness: Awake/alert Behavior During Therapy: WFL for tasks assessed/performed Overall Cognitive Status: Within Functional Limits for tasks assessed  Blood pressure 143/46, pulse  70, temperature 97.7 F (36.5 C), temperature source Axillary, resp. rate 18, height 5\' 6"  (1.676 m), weight 50.6 kg (111 lb 8.8 oz), SpO2 96 %. Physical Exam  HENT:  Scalp laceration with staples in place  Eyes:  Pupils reactive to light  Neck: Normal  range of motion. Neck supple. No thyromegaly present.  Cardiovascular:  Cardiac rate controlled  Respiratory: Effort normal and breath sounds normal. No respiratory distress.  GI: Soft. Bowel sounds are normal. She exhibits no distension.  Neurological: She is alert.  Patient is oriented to person, place and date of birth. Told me month, day of week and year. Very alert. Follows simple commands. She could not recall of events of her fall. Fair awareness of deficits. LUE and LLE 4/5 deltoid,bicep, tricep, wrist, HF, KE and ankle. Trace sensory loss along left face and left leg and arm. No resting tremor or rigidity.  Psychiatric: She has a normal mood and affect. Her behavior is normal.     Lab Results Last 24 Hours    No results found for this or any previous visit (from the past 24 hour(s)).    Imaging Results (Last 48 hours)    No results found.    Assessment/Plan: Diagnosis: TBI after fall, hx of  1. Does the need for close, 24 hr/day medical supervision in concert with the patient's rehab needs make it unreasonable for this patient to be served in a less intensive setting? Yes 2. Co-Morbidities requiring supervision/potential complications: AVD, htn, PAF, CAD 3. Due to bladder management, bowel management, safety, skin/wound care, disease management, medication administration, pain management and patient education, does the patient require 24 hr/day rehab nursing? Yes 4. Does the patient require coordinated care of a physician, rehab nurse, PT (1-2 hrs/day, 5 days/week), OT (1-2 hrs/day, 5 days/week) and SLP (1-2 hrs/day, 5 days/week) to address physical and functional deficits in the context of the above medical diagnosis(es)? Yes Addressing deficits in the following areas: balance, endurance, locomotion, strength, transferring, bowel/bladder control, bathing, dressing, feeding, grooming, toileting, cognition and psychosocial support 5. Can the patient actively participate in an  intensive therapy program of at least 3 hrs of therapy per day at least 5 days per week? Yes 6. The potential for patient to make measurable gains while on inpatient rehab is excellent 7. Anticipated functional outcomes upon discharge from inpatient rehab are supervision and min assist with PT, supervision and min assist with OT, modified independent and supervision with SLP. 8. Estimated rehab length of stay to reach the above functional goals is: 16-22 days 9. Does the patient have adequate social supports and living environment to accommodate these discharge functional goals? Yes 10. Anticipated D/C setting: Home 11. Anticipated post D/C treatments: HH therapy and Outpatient therapy 12. Overall Rehab/Functional Prognosis: excellent  RECOMMENDATIONS: This patient's condition is appropriate for continued rehabilitative care in the following setting: CIR Patient has agreed to participate in recommended program. Yes Note that insurance prior authorization may be required for reimbursement for recommended care.  Comment: [name] was very active PTA. Was driving up until a few months ago. Takes care of her own home. Uses cane only outside of the house. Very motivated to improve and participate in our program. Daughter can help after discharge.   Rehab Admissions Coordinator to follow up.  Thanks,  Ranelle OysterZachary T. Swartz, MD, [facility] CenterFAAPMR     06/20/2014       Revision History     Date/Time User Provider Type Action   06/20/2014 9:15 AM  Ranelle Oyster, MD Physician Sign   06/20/2014 6:30 AM Charlton Amor, PA-C Physician Assistant Pend   View Details Report       Routing History     Date/Time From To Method   06/20/2014 9:15 AM Ranelle Oyster, MD Ranelle Oyster, MD In Basket   06/20/2014 9:15 AM Ranelle Oyster, MD Rodrigo Ran, MD Fax

## 2014-06-21 NOTE — Interval H&P Note (Signed)
Mervyn Skeetersula W Hoehn was admitted today to Inpatient Rehabilitation with the diagnosis of TBI.  The patient's history has been reviewed, patient examined, and there is no change in status.  Patient continues to be appropriate for intensive inpatient rehabilitation.  I have reviewed the patient's chart and labs.  Questions were answered to the patient's satisfaction. The PAPE has been reviewed and assessment remains appropriate.  Torey Reinard T 06/21/2014, 9:24 PM

## 2014-06-21 NOTE — Care Management Note (Signed)
Case Management Note  Patient Details  Name: Mervyn Skeetersula W Swoyer MRN: [id number] Date of Birth: 04/17/1920  Subjective/Objective:                    Action/Plan:Discharged to IP Rehab  Expected Discharge Date:                  Expected Discharge Plan:  IP Rehab Facility (Awaiting f/u with CIR Coordinator)  In-House Referral:     Discharge planning Services  CM Consult  Post Acute Care Choice:  IP Rehab Choice offered to:     DME Arranged:    DME Agency:     HH Arranged:    HH Agency:     Status of Service:  Completed, signed off  Medicare Important Message Given:  Yes Date Medicare IM Given:  06/20/14 Medicare IM give by:  Louretta Parmarutchfield, Jahmeir Geisen Date Additional Medicare IM Given:    Additional Medicare Important Message give by:     If discussed at Long Length of Stay Meetings, dates discussed:    Additional Comments:  Yvone NeuCrutchfield, Leith Hedlund M, RN 06/21/2014, 3:10 PM

## 2014-06-21 NOTE — Progress Notes (Signed)
I have insurance approval to admit pt to inpt rehab today. I met with dtr and pt at bedside and they are in agreement. I have contacted Attending MD, RN CM and SW. I will make the arrangements to admit today. [phone]

## 2014-06-21 NOTE — Clinical Social Work Note (Signed)
Patient accepted into CIR and insurance authorization has been obtained. [facility].  Clinical Social Worker will sign off for now as social work intervention is no longer needed. Please consult us again if new need arises.  Derenda FennelBashira Jewelene Mairena, MSW, LCSWA [phone] 06/21/2014 10:01 AM

## 2014-06-21 NOTE — H&P (View-Only) (Signed)
  Physical Medicine and Rehabilitation Admission H&P    Chief Complaint  Patient presents with  . Fall   HPI:  [name] is an 81 y.o. Female with history of CAD, HTN, Afib who was admitted on 06/16/14 after a fall while going down stairs. No LOC but patient with posterior scalp laceration and left facial droop noted by family. She was evaluated in ED and found to have right thalamic hemorrhage with additional small hemorrhagic foci around right thalamic hematoma, parafalcine hematoma, trace SAH along right falx and  small SDH left temporal lobe.  Serial CCT done for monitoring revealing stable hematoma.  2D echo done revealing EF 55-60% with severe calcification AV with mild to moderate regurgitation and moderately calcified MV. Dr. Xu following for input and ASA d/c due to ICH. Patient with spontaneous R-BG hemorrhage and at hight risk of bleeding if on regular AC.    Review of Systems  HENT: Positive for hearing loss.   Eyes: Negative for blurred vision and double vision.  Respiratory: Negative for cough and shortness of breath.        DOE  Cardiovascular: Positive for palpitations (on rare occasions). Negative for chest pain.  Gastrointestinal: Negative for heartburn, nausea, diarrhea and constipation.  Genitourinary: Negative for dysuria and urgency.  Musculoskeletal: Positive for neck pain. Negative for myalgias and back pain.  Neurological: Positive for focal weakness (LUE weakness ). Negative for dizziness and headaches.  Psychiatric/Behavioral: The patient does not have insomnia.       Past Medical History  Diagnosis Date  . Atrial fibrillation with rapid ventricular response Feb 2010  . Hypertension   . Flash pulmonary edema Feb 2010    Due to atrial fib with RVR  . Aortic insufficiency Feb 2010    Mild to moderate per echo with normal EF  . Arthritis   . Hyperlipidemia   . Dementia   . Vitamin D deficiency   . IBS (irritable bowel syndrome)   . PUD (peptic ulcer  disease)   . Compression fracture   . Raynaud phenomenon   . CAD (coronary artery disease)     2010.  60% RCA, 60 - 70% LAD  . Carotid stenosis     40 - 59% bilateral    Past Surgical History  Procedure Laterality Date  . Cardiac catheterization  03/11/2008    EF 45%  . Appendectomy    . Transthoracic echocardiogram  03/06/2008    EF 55%    Family History  Problem Relation Age of Onset  . Stroke Mother   . Cancer Mother   . Heart attack Father     Social History:  Lives alone.  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 Independent with use of cane when out of home setting. Does her cooking, cleaning and manages her finances. She reports that she has never smoked. She does not have any smokeless tobacco history on file. She reports that she does not drink alcohol or use illicit drugs.    Allergies  Allergen Reactions  . Aspirin     REACTION: Intolerance  . Zoledronic Acid     REACTION: Intolerance    Medications Prior to Admission  Medication Sig Dispense Refill  . amiodarone (PACERONE) 200 MG tablet TAKE 1/2 TABLET BY MOUTH EVERY DAY 30 tablet 6  . aspirin EC 81 MG tablet Take 81 mg by mouth daily.      . atorvastatin (LIPITOR) 20 MG tablet Take 20 mg by mouth daily.    .   citalopram (CELEXA) 20 MG tablet Take 20 mg by mouth daily.     . denosumab (PROLIA) 60 MG/ML SOLN injection Inject 60 mg into the skin every 6 (six) months. Administer in upper arm, thigh, or abdomen    . ibuprofen (ADVIL,MOTRIN) 200 MG tablet Take 400 mg by mouth every 6 (six) hours as needed for mild pain.    . nitroGLYCERIN (NITRODUR - DOSED IN MG/24 HR) 0.4 mg/hr patch REMOVE OLD PATCH AND APPLY NEW PATCH EVERY NIGHT 30 patch 4  . valsartan (DIOVAN) 160 MG tablet one table every day    . Vitamin D, Ergocalciferol, (DRISDOL) 50000 UNITS CAPS Take 50,000 Units by mouth every 7 (seven) days.        Home: Home Living Family/patient expects to be discharged to:: Private residence Living  Arrangements: Alone Available Help at Discharge: Family, Available 24 hours/day, Other (Comment) (can provide 24/7 assist for a few weeks. Want to try home fi) Type of Home: House Home Access: Level entry Home Layout: Two level, Bed/bath upstairs Home Equipment: Cane - single point  Lives With: Alone   Functional History: Prior Function Level of Independence: Independent Comments: occassional use of cane; stopped driving on birthday last year  Functional Status:  Mobility: Bed Mobility Overal bed mobility: Needs Assistance Bed Mobility: Supine to Sit, Sit to Supine Supine to sit: Mod assist, HOB elevated Sit to supine: Mod assist General bed mobility comments: increased time to come to EOB with HOB elevated, VCs for use of self assist with stronger extremities Transfers Overall transfer level: Needs assistance Equipment used: Rolling walker (2 wheeled) Transfers: Sit to/from Stand Sit to Stand: Mod assist Stand pivot transfers: Mod assist General transfer comment: cues for posture lateral lean R and upright posture. Pt needed (A) blocking L LE Ambulation/Gait Ambulation/Gait assistance: Mod assist Ambulation Distance (Feet): 40 Feet (broken into 4 trials of 10 ft with cues X2) Assistive device: 1 person hand held assist, 2 person hand held assist General Gait Details: Patient with noted improvements in gait this session, improved ability to advance LLE but continued to required VCs and tactile faciliatation of weight shift with fatigue. Patient with multiple instances of self correction for upright posture.  Gait Pattern/deviations: Step-to pattern, Decreased step length - left, Decreased stance time - left, Decreased dorsiflexion - left, Decreased weight shift to right, Narrow base of support Gait velocity: decreased    ADL: ADL Overall ADL's : Needs assistance/impaired Eating/Feeding: Set up, Sitting Eating/Feeding Details (indicate cue type and reason): assist to open  containers Grooming: Set up, Sitting Grooming Details (indicate cue type and reason): pt actively using LUE to wipe face and is highly motivated.  Upper Body Bathing: Minimal assitance, Sitting Lower Body Bathing: Moderate assistance, Sit to/from stand Upper Body Dressing : Moderate assistance, Sitting Lower Body Dressing: Maximal assistance, Sit to/from stand Toilet Transfer: Moderate assistance, Stand-pivot, RW, BSC Toileting- Clothing Manipulation and Hygiene: Maximal assistance, Sit to/from stand Functional mobility during ADLs: Moderate assistance, Rolling walker General ADL Comments: Session focused on static sitting, dynamic sitting balance , basic transfer, and static standing. Pt required verbal cues and (A) for bed mobility ( see below). pt once at eob therapist sitting to patients L side to allow tactile input of shoulders touching to cue patient to correct posture. Pt needed mod cues initially to correct. Pt with incr time able to lateral lean R to correct posture at eob. pt with forward flexion of hipsand needed cues for elongation of spine. pt performed reaching   task at EOB to challenge balance once static sitting was becoming easier. pt lateral leaning L with all dynamic challenges. Pt basic transfer with L Le blocked. pt shifting weight R and L with L LE buckling noted. Pt with lateral lean L and stepping with R foot to weight bear through L LE. Pt completing this several times ot work on static standing. Pt stepping 3 steps forward and 3 stesp backward with L LE blocked.   Cognition: Cognition Overall Cognitive Status: Within Functional Limits for tasks assessed Arousal/Alertness: Awake/alert Orientation Level: Oriented X4 Attention: Sustained Sustained Attention: Appears intact Memory:  (will assess further) Awareness: Appears intact Problem Solving: Appears intact Safety/Judgment: Appears intact Cognition Arousal/Alertness: Awake/alert Behavior During Therapy: WFL for  tasks assessed/performed Overall Cognitive Status: Within Functional Limits for tasks assessed   Blood pressure 166/60, pulse 76, temperature 97.6 F (36.4 C), temperature source Oral, resp. rate 18, height 5' 6" (1.676 m), weight 50.6 kg (111 lb 8.8 oz), SpO2 94 %. Physical Exam  Nursing note and vitals reviewed. Constitutional: She is oriented to person, place, and time. She appears well-developed and well-nourished. No distress.  Thin elderly female. Fatigued appearing but NAD.  HENT:  Head: Normocephalic.  Right Ear: External ear normal.  Left Ear: External ear normal.  Posterior scalp with stapled laceration.  Eyes: Conjunctivae and EOM are normal. Pupils are equal, round, and reactive to light.  Neck: Normal range of motion. Neck supple. No JVD present. No tracheal deviation present. No thyromegaly present.  Cardiovascular: Normal rate.  An irregularly irregular rhythm present.  Murmur heard. Respiratory: Effort normal and breath sounds normal. No respiratory distress. She has no wheezes.  GI: Soft. Bowel sounds are normal. She exhibits no distension. There is no tenderness. There is no rebound.  Musculoskeletal: She exhibits no edema or tenderness.  Kyphosis noted.  Diffuse ecchymosis right forearm with healing abraded area on elbow.   Neurological: She is alert and oriented to person, place, and time.  Patient is oriented to person, place and date of birth. Told me month, day of week and year. Very alert, improved from yesterday. Follows simple commands. She could not recall of events of her fall. Fair awareness of deficits. Good attention LUE and LLE 4/5 deltoid,bicep, tricep, wrist, HF, KE and ankle. Trace sensory loss along left face and left leg and arm. No resting tremor or rigidity.   Skin: Skin is warm and dry. She is not diaphoretic.  Psychiatric: She has a normal mood and affect. Her speech is normal and behavior is normal. Cognition and memory are normal.    No  results found for this or any previous visit (from the past 48 hour(s)). No results found.     Medical Problem List and Plan: 1. Functional deficits secondary to TBI after fall  2.  DVT Prophylaxis/Anticoagulation: Mechanical: Sequential compression devices, below knee Bilateral lower extremities 3. Pain Management: Tylenol prn.  4. Mood: On Celexa daily. LCSW to follow for evaluation and support.  5. Neuropsych: This patient is capable of making decisions on her own behalf. 6. Skin/Wound Care: Routine pressure relief measures. Scalp laceration healing well--remove scalp staples later this week.   7. Fluids/Electrolytes/Nutrition: Monitor I/O. Check lytes in am.  Offer supplements between meals.  8. HTN: Monitor BP every 8 hours. Has been labile and medications increased to bid.  H/o orthostasis in the past--monitor for symptoms.  9. Dyslipidemia: On hold due to ICH.  Resume at discharge. 10. ABLA: Will add iron supplement.    11. PAF with h/o flash edema due to RVR:  On amiodarone. Monitor HR every 8 hours.  12. CAD: Has been managed medically without symptoms.  13. IBS:  Discussed ways to modify diet to help with constipation/diarrhea issues.    Post Admission Physician Evaluation: 1. Functional deficits secondary  to TBI after fall 2. Patient is admitted to receive collaborative, interdisciplinary care between the physiatrist, rehab nursing staff, and therapy team. 3. Patient's level of medical complexity and substantial therapy needs in context of that medical necessity cannot be provided at a lesser intensity of care such as a SNF. 4. Patient has experienced substantial functional loss from his/her baseline which was documented above under the "Functional History" and "Functional Status" headings.  Judging by the patient's diagnosis, physical exam, and functional history, the patient has potential for functional progress which will result in measurable gains while on inpatient rehab.   These gains will be of substantial and practical use upon discharge  in facilitating mobility and self-care at the household level. 5. Physiatrist will provide 24 hour management of medical needs as well as oversight of the therapy plan/treatment and provide guidance as appropriate regarding the interaction of the two. 6. 24 hour rehab nursing will assist with bladder management, bowel management, safety, skin/wound care, disease management, medication administration, pain management and patient education  and help integrate therapy concepts, techniques,education, etc. 7. PT will assess and treat for/with: Lower extremity strength, range of motion, stamina, balance, functional mobility, safety, adaptive techniques and equipment, visual-perceptual awareness, attention, family education, pain control, goals are: supervision. 8. OT will assess and treat for/with: ADL's, functional mobility, safety, upper extremity strength, adaptive techniques and equipment, NMR, visual-perceptual awareness, attention/focus, ego support, family ed.   Goals are: supervision. Therapy may proceed with showering this patient. 9. SLP will assess and treat for/with: cognition, communication, speech.  Goals are: mod I. 10. Case Management and Social Worker will assess and treat for psychological issues and discharge planning. 11. Team conference will be held weekly to assess progress toward goals and to determine barriers to discharge. 12. Patient will receive at least 3 hours of therapy per day at least 5 days per week. 13. ELOS: 16-22 days       14. Prognosis:  excellent     Ayn Domangue T. Vernel Langenderfer, MD, FAAPMR [place] [facility] 06/21/2014   06/21/2014 

## 2014-06-21 NOTE — Progress Notes (Signed)
[facility] 4w[facility] Assessment stable.

## 2014-06-22 ENCOUNTER — Inpatient Hospital Stay (HOSPITAL_COMMUNITY): Payer: Medicare Other

## 2014-06-22 ENCOUNTER — Inpatient Hospital Stay (HOSPITAL_COMMUNITY): Payer: Medicare Other | Admitting: Speech Pathology

## 2014-06-22 ENCOUNTER — Inpatient Hospital Stay (HOSPITAL_COMMUNITY): Payer: Medicare Other | Admitting: Physical Therapy

## 2014-06-22 LAB — CBC WITH DIFFERENTIAL/PLATELET
BASOS ABS: 0 10*3/uL (ref 0.0–0.1)
BASOS PCT: 1 % (ref 0–1)
EOS ABS: 0.1 10*3/uL (ref 0.0–0.7)
Eosinophils Relative: 2 % (ref 0–5)
HCT: 32.2 % — ABNORMAL LOW (ref 36.0–46.0)
Hemoglobin: 10.4 g/dL — ABNORMAL LOW (ref 12.0–15.0)
Lymphocytes Relative: 22 % (ref 12–46)
Lymphs Abs: 1.7 10*3/uL (ref 0.7–4.0)
MCH: 30 pg (ref 26.0–34.0)
MCHC: 32.3 g/dL (ref 30.0–36.0)
MCV: 92.8 fL (ref 78.0–100.0)
Monocytes Absolute: 0.8 10*3/uL (ref 0.1–1.0)
Monocytes Relative: 11 % (ref 3–12)
NEUTROS PCT: 64 % (ref 43–77)
Neutro Abs: 5 10*3/uL (ref 1.7–7.7)
Platelets: 247 10*3/uL (ref 150–400)
RBC: 3.47 MIL/uL — AB (ref 3.87–5.11)
RDW: 14.2 % (ref 11.5–15.5)
WBC: 7.8 10*3/uL (ref 4.0–10.5)

## 2014-06-22 LAB — COMPREHENSIVE METABOLIC PANEL
ALBUMIN: 3.2 g/dL — AB (ref 3.5–5.0)
ALK PHOS: 81 U/L (ref 38–126)
ALT: 12 U/L — ABNORMAL LOW (ref 14–54)
AST: 14 U/L — AB (ref 15–41)
Anion gap: 9 (ref 5–15)
BILIRUBIN TOTAL: 0.7 mg/dL (ref 0.3–1.2)
BUN: 15 mg/dL (ref 6–20)
CHLORIDE: 99 mmol/L — AB (ref 101–111)
CO2: 26 mmol/L (ref 22–32)
CREATININE: 0.61 mg/dL (ref 0.44–1.00)
Calcium: 9 mg/dL (ref 8.9–10.3)
GFR calc Af Amer: >60 mL/min (ref >60)
GLUCOSE: 102 mg/dL — AB (ref 65–99)
POTASSIUM: 4.1 mmol/L (ref 3.5–5.1)
Sodium: 134 mmol/L — ABNORMAL LOW (ref 135–145)
Total Protein: 6.9 g/dL (ref 6.5–8.1)

## 2014-06-22 NOTE — Progress Notes (Signed)
Physical Therapy Session Note  Patient Details  Name: [name] MRN: [id number] Date of Birth: 09/23/1920  Today's Date: 06/22/2014 PT Individual Time: 1330-1400 PT Individual Time Calculation (min): 30 min   Skilled Therapeutic Interventions/Progress Updates:   Session focused on midline orientation, upright posture, sit <> stand transfers, and standing balance. Patient received sitting in wheelchair, daughter reporting that patient with increased fatigue due to sitting up in wheelchair all day "not wanting to bother anyone" to help her back to bed. Educated patient on need for rest breaks throughout day to improve overall participation with therapy, verbalized understanding. Patient propelled wheelchair using BLE with min-mod A and max verbal/tactile cuing at L knee flexors/extensors x 100 ft with increased time. Performed stand pivot transfer wheelchair > standard arm chair with max-total A due to heavy lateral lean left. From standard arm chair, performed sit <> stand with max > mod A, verbal/tactile cues for L foot placement, hand placement, forward weight shift, and L knee extension in standing. Patient required mod-max A for standing balance and provided with RW for BUE support. Standing balance improved to supervision-mod A using RW with lateral lean to left. To facilitate increased L knee extension in stance phase of gait, performed RLE step taps to 6" steps using RW for UE support with mod-max A for balance and postural control due to lateral lean to L and forward head. Patient left sitting in wheelchair in gym, handoff to OT.   Therapy Documentation Precautions:  Precautions Precautions: Fall Restrictions Weight Bearing Restrictions: No Pain: Pain Assessment Pain Assessment: No/denies pain  See FIM for current functional status  Therapy/Group: Individual Therapy  Kerney ElbeVarner, Torra Pala A 06/22/2014, 3:48 PM

## 2014-06-22 NOTE — Evaluation (Signed)
Speech Language Pathology Assessment and Plan  Patient Details  Name: ADELENE POLIVKA MRN: [id number] Date of Birth: [birth year]  SLP Diagnosis: Cognitive Impairments  Rehab Potential: Excellent ELOS: 2.5-3 weeks     Today's Date: 06/22/2014 SLP Individual Time: 0930-1030 SLP Individual Time Calculation (min): 60 min   Problem List:  Patient Active Problem List   Diagnosis Date Noted  . Mild TBI 06/21/2014  . Traumatic brain injury   . SDH (subdural hematoma) 06/16/2014  . Nontraumatic hemorrhage of cerebral hemisphere   . Hypertensive urgency   . CAD (coronary artery disease) of artery bypass graft 06/15/2012  . Carotid bruit present 06/15/2012  . Raynaud phenomenon 02/08/2011  . PAF (paroxysmal atrial fibrillation) 10/09/2010  . High risk medication use 10/09/2010  . Aortic valve disease 10/09/2010  . HYPERLIPIDEMIA 06/10/2007  . HYPERTENSION 06/10/2007  . ALLERGIC RHINITIS 06/10/2007  . EMPHYSEMA 06/10/2007  . DYSPNEA 06/10/2007  . COUGH 06/10/2007   Past Medical History:  Past Medical History  Diagnosis Date  . Atrial fibrillation with rapid ventricular response Feb 2010  . Hypertension   . Flash pulmonary edema Feb 2010    Due to atrial fib with RVR  . Aortic insufficiency Feb 2010    Mild to moderate per echo with normal EF  . Arthritis   . Hyperlipidemia   . Dementia   . Vitamin D deficiency   . IBS (irritable bowel syndrome)   . PUD (peptic ulcer disease)   . Compression fracture   . Raynaud phenomenon   . CAD (coronary artery disease)     2010.  60% RCA, 60 - 70% LAD  . Carotid stenosis     40 - 59% bilateral   Past Surgical History:  Past Surgical History  Procedure Laterality Date  . Cardiac catheterization  03/11/2008    EF 45%  . Appendectomy    . Transthoracic echocardiogram  03/06/2008    EF 55%    Assessment / Plan / Recommendation Clinical Impression Patient is a 81 y.o. Female with history of CAD, HTN, Afib who was admitted on 06/16/14  after a fall while going down stairs. No LOC but patient with posterior scalp laceration and left facial droop noted by family. She was evaluated in ED and found to have right thalamic hemorrhage with additional small hemorrhagic foci around right thalamic hematoma, parafalcine hematoma, trace SAH along right falx and small SDH left temporal lobe.  Serial CCT done for monitoring revealing stable hematoma.  2D echo done revealing EF 55-60% with severe calcification AV with mild to moderate regurgitation and moderately calcified MV. Dr. Erlinda Hong following for input and ASA d/c due to [place]. Patient with spontaneous R-BG hemorrhage and at high risk of bleeding if on regular AC. Patient transferred to CIR on 06/21/14. Patient presents with moderate cognitive impairments characterized by decreased attention to left, recall of new information, emergent awareness of deficits and functional problem solving which impacts her ability to complete functional and familiar tasks safely. Patient would benefit from skilled SLP intervention  to maximize her cognitive function and overall functional independence prior to discharge.   Skilled Therapeutic Interventions          Administered a cognitive-linguistic evaluation. Please see above for details. Patient also administered the MoCA and scored a 19/30 with a score of 26 or above considered normal. Educated patient on current cognitive function and goals of skilled SLP intervention, she verbalized understanding.   SLP Assessment  Patient will need skilled [place] [facility]  Services during CIR admission    Recommendations  Oral Care Recommendations: Oral care BID Recommendations for Other Services: Neuropsych consult Patient destination: Home Follow up Recommendations: 24 hour supervision/assistance;Home Health SLP Equipment Recommended: None recommended by SLP    SLP Frequency 3 to 5 out of 7 days   SLP Treatment/Interventions Cognitive remediation/compensation;Cueing  hierarchy;Functional tasks;Internal/external aids;Environmental controls;Patient/family education;Therapeutic Activities    Pain Pain Assessment Pain Assessment: No/denies pain Prior Functioning Type of Home: House  Lives With: Alone Available Help at Discharge: Family;Available 24 hours/day (24/7 supervision available for a few weeks) Vocation: Retired  Industrial/product designer Term Goals: Week 1: SLP Short Term Goal 1 (Week 1): Patient will attend to left field of enviornment/body during functional and familiar tasks with Min A verbal and question cues. SLP Short Term Goal 2 (Week 1): Patient will demonstrate functional problem solving for functional and familiar tasks with Min A verbal and question cues.  SLP Short Term Goal 3 (Week 1): Patient will utilize call bell to request assistance with supervision verbal cues in 75% of observable opportunities  SLP Short Term Goal 4 (Week 1): Patient will self-monitor and correct errors during functional and familiar tasks with Min A verbal and question cues.  SLP Short Term Goal 5 (Week 1): Patient will recall new, daily information with use of visual aids with Min A verbal and question cues.   See FIM for current functional status Refer to Care Plan for Long Term Goals  Recommendations for other services: Neuropsych  Discharge Criteria: Patient will be discharged from SLP if patient refuses treatment 3 consecutive times without medical reason, if treatment goals not met, if there is a change in medical status, if patient makes no progress towards goals or if patient is discharged from hospital.  The above assessment, treatment plan, treatment alternatives and goals were discussed and mutually agreed upon: by patient  Carmela Piechowski 06/22/2014, 3:58 PM

## 2014-06-22 NOTE — Progress Notes (Signed)
[facility] PHYSICAL MEDICINE & REHABILITATION     PROGRESS NOTE    Subjective/Complaints: Had a good night. Denies any pains or aches. Pt denies nausea, vomiting, abdominal pain, diarrhea, chest pain, shortness of breath, palpitations, dizziness   Objective: Vital Signs: Blood pressure 131/76, pulse 72, temperature 98.3 F (36.8 C), temperature source Oral, resp. rate 17, height  (1.676 m), weight 50.531 kg (111 lb 6.4 oz), SpO2 98 %. No results found.  Recent Labs  06/22/14 0543  WBC 7.8  HGB 10.4*  HCT 32.2*  PLT 247    Recent Labs  06/22/14 0543  NA 134*  K 4.1  CL 99*  GLUCOSE 102*  BUN 15  CREATININE 0.61  CALCIUM 9.0   CBG (last 3)  No results for input(s): GLUCAP in the last 72 hours.  Wt Readings from Last 3 Encounters:  06/21/14 50.531 kg (111 lb 6.4 oz)  06/16/14 50.6 kg (111 lb 8.8 oz)  06/07/14 52.164 kg (115 lb)    Physical Exam:  Constitutional: She is oriented to person, place, and time. She appears well-developed and well-nourished. No distress.  Thin elderly female. Fatigued appearing but NAD.  HENT:  Head: Normocephalic.  Right Ear: External ear normal.  Left Ear: External ear normal.  Posterior scalp with stapled laceration.  Eyes: Conjunctivae and EOM are normal. Pupils are equal, round, and reactive to light.  Neck: Normal range of motion. Neck supple. No JVD present. No tracheal deviation present. No thyromegaly present.  Cardiovascular: Normal rate. An irregularly irregular rhythm present.  Murmur heard. Respiratory: Effort normal and breath sounds normal. No respiratory distress. She has no wheezes.  GI: Soft. Bowel sounds are normal. She exhibits no distension. There is no tenderness. There is no rebound.  Musculoskeletal: She exhibits no edema or tenderness.  Kyphosis noted. Diffuse ecchymosis right forearm with healing abraded area on elbow.  Neurological: She is alert and oriented to person, place, and time.  Patient  is oriented to person, place and date of birth. Remains very alert.  Good attention LUE and LLE 4/5 deltoid,bicep, tricep, wrist, HF, KE and ankle. Trace sensory loss along left face and left leg and arm. No resting tremor or rigidity.  Skin: Skin is warm and dry. She is not diaphoretic.  Psychiatric: She has a normal mood and affect. Her speech is normal and behavior is normal. Cognition and memory are normal.     Assessment/Plan: 1. Functional deficits secondary to TBI after fall which require 3+ hours per day of interdisciplinary therapy in a comprehensive inpatient rehab setting. Physiatrist is providing close team supervision and 24 hour management of active medical problems listed below. Physiatrist and rehab team continue to assess barriers to discharge/monitor patient progress toward functional and medical goals. FIM:                   Comprehension Comprehension Mode: Auditory Comprehension: 5-Follows basic conversation/direction: With no assist  Expression Expression Mode: Verbal Expression: 5-Expresses basic needs/ideas: With no assist  Social Interaction Social Interaction: 4-Interacts appropriately 75 - 89% of the time - Needs redirection for appropriate language or to initiate interaction.  Problem Solving Problem Solving Mode: Not assessed  Memory Memory: 5-Recognizes or recalls 90% of the time/requires cueing < 10% of the time  Medical Problem List and Plan: 1. Functional deficits secondary to TBI after fall  2. DVT Prophylaxis/Anticoagulation: Mechanical: Sequential compression devices, below knee Bilateral lower extremities 3. Pain Management: Tylenol prn.  4. Mood: On Celexa daily.  LCSW to follow for evaluation and support.  5. Neuropsych: This patient is capable of making decisions on her own behalf. 6. Skin/Wound Care: Routine pressure relief measures. Scalp laceration healing well--remove scalp staples later this week.  7.  Fluids/Electrolytes/Nutrition: Monitor I/O.  Encourage adequate po intake. Labs normal today  8. HTN: Monitor BP every 8 hours. Has been labile and medications increased to bid. H/o orthostasis in the past--monitor for symptoms.  9. Dyslipidemia: On hold due to ICH. Resume at discharge. 10. ABLA:  iron supplement.  11. PAF with h/o flash edema due to RVR: On amiodarone. Monitor HR every 8 hours.  12. CAD: Has been managed medically without symptoms.  13. IBS: Discussed ways to modify diet to help with constipation/diarrhea issues LOS (Days) 1 A FACE TO FACE EVALUATION WAS PERFORMED  Jerimyah Vandunk T 06/22/2014 7:53 AM

## 2014-06-22 NOTE — Discharge Instructions (Signed)
Inpatient Rehab Discharge Instructions  Mervyn Skeetersula W Eisemann Discharge date and time:    Activities/Precautions/ Functional Status: Activity: activity as tolerated Diet: low fat, low cholesterol diet Wound Care: keep wound clean and dry   Functional status:  ___ No restrictions     ___ Walk up steps independently ___ 24/7 supervision/assistance   ___ Walk up steps with assistance ___ Intermittent supervision/assistance  ___ Bathe/dress independently ___ Walk with walker     ___ Bathe/dress with assistance ___ Walk Independently    ___ Shower independently ___ Walk with assistance    ___ Shower with assistance ___ No alcohol     ___ Return to work/school ________  Special Instructions:    My questions have been answered and I understand these instructions. I will adhere to these goals and the provided educational materials after my discharge from the hospital.  Patient/Caregiver Signature _______________________________ Date __________  Clinician Signature _______________________________________ Date __________  Please bring this form and your medication list with you to all your follow-up doctor's appointments.

## 2014-06-22 NOTE — Evaluation (Signed)
Physical Therapy Assessment and Plan  Patient Details  Name: [name] MRN: [id number] Date of Birth: [birth year]  PT Diagnosis: Abnormal posture, Abnormality of gait, Coordination disorder, Hemiplegia non-dominant, Impaired cognition, Impaired sensation and Muscle weakness Rehab Potential: Good ELOS: 17-21 days   Today's Date: 06/22/2014 PT Individual Time: 800-900  PT Individual Time Calculation (min): 60 min    Problem List:  Patient Active Problem List   Diagnosis Date Noted  . Mild TBI 06/21/2014  . Traumatic brain injury   . SDH (subdural hematoma) 06/16/2014  . Nontraumatic hemorrhage of cerebral hemisphere   . Hypertensive urgency   . CAD (coronary artery disease) of artery bypass graft 06/15/2012  . Carotid bruit present 06/15/2012  . Raynaud phenomenon 02/08/2011  . PAF (paroxysmal atrial fibrillation) 10/09/2010  . High risk medication use 10/09/2010  . Aortic valve disease 10/09/2010  . HYPERLIPIDEMIA 06/10/2007  . HYPERTENSION 06/10/2007  . ALLERGIC RHINITIS 06/10/2007  . EMPHYSEMA 06/10/2007  . DYSPNEA 06/10/2007  . COUGH 06/10/2007    Past Medical History:  Past Medical History  Diagnosis Date  . Atrial fibrillation with rapid ventricular response Feb 2010  . Hypertension   . Flash pulmonary edema Feb 2010    Due to atrial fib with RVR  . Aortic insufficiency Feb 2010    Mild to moderate per echo with normal EF  . Arthritis   . Hyperlipidemia   . Dementia   . Vitamin D deficiency   . IBS (irritable bowel syndrome)   . PUD (peptic ulcer disease)   . Compression fracture   . Raynaud phenomenon   . CAD (coronary artery disease)     2010.  60% RCA, 60 - 70% LAD  . Carotid stenosis     40 - 59% bilateral   Past Surgical History:  Past Surgical History  Procedure Laterality Date  . Cardiac catheterization  03/11/2008    EF 45%  . Appendectomy    . Transthoracic echocardiogram  03/06/2008    EF 55%    Assessment & Plan Clinical Impression: [name] is an 81 y.o. Female with history of CAD, HTN, Afib who was admitted on 06/16/14 after a fall while going down stairs. No LOC but patient with posterior scalp laceration and left facial droop noted by family. She was evaluated in ED and found to have right thalamic hemorrhage with additional small hemorrhagic foci around right thalamic hematoma, parafalcine hematoma, trace SAH along right falx and small SDH left temporal lobe. Serial CCT done for monitoring revealing stable hematoma. 2D echo done revealing EF 55-60% with severe calcification AV with mild to moderate regurgitation and moderately calcified MV. Dr. Erlinda Hong following for input and ASA d/c due to [name]. Patient with spontaneous R-BG hemorrhage and at hight risk of bleeding if on regular AC. Patient transferred to CIR on 06/21/2014 .   Patient currently requires mod with mobility secondary to muscle weakness, decreased cardiorespiratoy endurance and decreased oxygen support, impaired timing and sequencing, abnormal tone, unbalanced muscle activation and decreased coordination, decreased midline orientation and decreased attention to left and decreased awareness, decreased problem solving and decreased memory.  Prior to hospitalization, patient was modified independent  with mobility and lived with Alone in a House home.  Home access is 5 in the front with 1 rail, 4 in the back with 2 railsStairs to enter.  Patient will benefit from skilled PT intervention to maximize safe functional mobility, minimize fall risk and decrease caregiver burden for planned discharge home  with 24 hour supervision.  Anticipate patient will benefit from follow up [place] at discharge.  PT - End of Session Activity Tolerance: Tolerates 30+ min activity with multiple rests Endurance Deficit: Yes Endurance Deficit Description: requires frequent rest breaks with mobility, fatigued quickly  PT Assessment Rehab Potential (ACUTE/IP ONLY): Good Barriers to Discharge:  Inaccessible home environment;Decreased caregiver support (24/7 S only available first few weeks) PT Patient demonstrates impairments in the following area(s): Balance;Endurance;Motor;Nutrition;Pain;Perception;Safety;Sensory;Skin Integrity PT Transfers Functional Problem(s): Bed Mobility;Bed to Chair;Car;Furniture PT Locomotion Functional Problem(s): Ambulation;Wheelchair Mobility;Stairs PT Plan PT Intensity: Minimum of 1-2 x/day ,45 to 90 minutes PT Frequency: 5 out of 7 days PT Duration Estimated Length of Stay: 17-21 days PT Treatment/Interventions: Ambulation/gait training;Balance/vestibular training;Community reintegration;Discharge planning;Disease management/prevention;DME/adaptive equipment instruction;Functional mobility training;Neuromuscular re-education;Pain management;Patient/family education;Psychosocial support;Stair training;Therapeutic Activities;Therapeutic Exercise;UE/LE Strength taining/ROM;UE/LE Coordination activities;Visual/perceptual remediation/compensation;Wheelchair propulsion/positioning PT Transfers Anticipated Outcome(s): supervision PT Locomotion Anticipated Outcome(s): supervision PT Recommendation Recommendations for Other Services: Neuropsych consult Follow Up Recommendations: 24 hour supervision/assistance;Home health PT Patient destination: Home Equipment Recommended: To be determined  Skilled Therapeutic Intervention Skilled therapeutic intervention initiated after completion of evaluation. Discussed with patient falls risk, safety within room, and focus of therapy during stay. Discussed possible LOS, goals, and f/u therapy.  PT Evaluation Precautions/Restrictions Precautions Precautions: Fall Restrictions Weight Bearing Restrictions: No General Seated BP 109/50, HR 103, Sp02 between 86-98% on room air after ambulation Pain Pain Assessment Pain Assessment: No/denies pain Home Living/Prior Functioning Home Living Available Help at Discharge:  Family;Available 24 hours/day (24/7 supervision available for a few weeks) Type of Home: House Home Access: Stairs to enter [employer] of Steps: 5 in the front with 1 rail, 4 in the back with 2 rails Entrance Stairs-Rails: Right;Left;Can reach both Home Layout: Two level;Able to live on main level with bedroom/bathroom Alternate Level Stairs-Number of Steps: 17 steps with a landing Alternate Level Stairs-Rails: Right  Lives With: Alone Prior Function Level of Independence: Independent with transfers;Requires assistive device for independence;Independent with gait;Independent with homemaking with ambulation;Independent with basic ADLs Driving: No Vocation: Retired Leisure: Hobbies-yes (Comment) Comments: likes to read, cook, and spend time with family Vision/Perception  Vision - Assessment Eye Alignment: Impaired (comment) (R eye deviated inward) Ocular Range of Motion: Within Functional Limits Alignment/Gaze Preference: Within Defined Limits Tracking/Visual Pursuits: Requires cues, head turns, or add eye shifts to track Saccades: Within functional limits Convergence: Within functional limits  Cognition Overall Cognitive Status: Impaired/Different from baseline Arousal/Alertness: Awake/alert Orientation Level: Oriented X4 Attention: Selective Sustained Attention: Appears intact Selective Attention: Appears intact Memory: Impaired Memory Impairment: Decreased recall of new information Awareness: Impaired Awareness Impairment: Emergent impairment Problem Solving: Impaired Problem Solving Impairment: Functional basic Safety/Judgment: Appears intact Sensation Sensation Light Touch: Impaired Detail Light Touch Impaired Details: Impaired LLE;Impaired LUE Hot/Cold: Not tested Proprioception: Impaired Detail Proprioception Impaired Details: Impaired LUE;Impaired LLE Coordination Gross Motor Movements are Fluid and Coordinated: No Fine Motor Movements are Fluid and  Coordinated: No Coordination and Movement Description: L hemiplegia, decreased proprioception LLE  Finger Nose Finger Test: unable to complete LUE Motor  Motor Motor: Hemiplegia;Abnormal postural alignment and control Motor - Skilled Clinical Observations: L hemiplegia, heavy lateral lean L  Mobility Bed Mobility Bed Mobility: Supine to Sit Supine to Sit: 3: Mod assist Supine to Sit Details: Manual facilitation for placement;Manual facilitation for weight shifting;Verbal cues for sequencing;Verbal cues for technique Transfers Transfers: Yes Sit to Stand: 4: Min assist;3: Mod assist Sit to Stand Details: Verbal cues for sequencing;Manual facilitation for placement;Verbal cues for technique Stand to Sit:  3: Mod assist;4: Min assist Stand to Sit Details (indicate cue type and reason): Manual facilitation for placement;Verbal cues for sequencing;Verbal cues for technique Stand Pivot Transfers: 3: Mod assist Stand Pivot Transfer Details: Tactile cues for posture;Manual facilitation for weight shifting;Verbal cues for technique;Verbal cues for sequencing;Tactile cues for weight beaing Locomotion  Ambulation Ambulation: Yes Ambulation/Gait Assistance: 4: Min assist;3: Mod assist Ambulation Distance (Feet): 90 Feet Assistive device: Other (Comment) (LUE around therapist) Ambulation/Gait Assistance Details: Tactile cues for posture;Manual facilitation for placement;Manual facilitation for weight bearing;Manual facilitation for weight shifting;Verbal cues for sequencing Gait Gait: Yes Gait Pattern: Impaired Gait Pattern: Step-through pattern;Decreased stance time - left;Decreased step length - right;Decreased hip/knee flexion - left;Decreased weight shift to left;Left flexed knee in stance;Lateral trunk lean to left;Decreased trunk rotation;Narrow base of support;Trunk flexed Gait velocity: decreased Stairs / Additional Locomotion Stairs: Yes Stairs Assistance: 3: Mod assist;4: Min  assist Stairs Assistance Details: Verbal cues for sequencing;Verbal cues for technique;Tactile cues for initiation;Tactile cues for posture;Manual facilitation for weight bearing Stair Management Technique: Two rails;Step to pattern;Forwards Number of Stairs: 5 Height of Stairs: 6 Ramp: Not tested (comment) Curb: Not tested (comment) Wheelchair Mobility Wheelchair Mobility: Yes Wheelchair Assistance: 3: Mod assist Wheelchair Assistance Details: Verbal cues for sequencing;Tactile cues for sequencing;Manual facilitation for weight bearing Wheelchair Propulsion: Both lower extermities Wheelchair Parts Management: Needs assistance Distance: 100 ft  Trunk/Postural Assessment  Cervical Assessment Cervical Assessment: Exceptions to [facility] (forward head) Thoracic Assessment Thoracic Assessment: Exceptions to [facility] (rounded shoulders; kyphotic posture) Lumbar Assessment Lumbar Assessment: Exceptions to [facility] (posterior pelvic tilt) Postural Control Postural Control: Deficits on evaluation Postural Limitations: impaired  Balance Balance Balance Assessed: Yes Static Sitting Balance Static Sitting - Balance Support: Feet supported;Bilateral upper extremity supported Static Sitting - Level of Assistance: 5: Stand by assistance Dynamic Sitting Balance Dynamic Sitting - Balance Support: Feet supported;During functional activity Dynamic Sitting - Level of Assistance: 4: Min assist Static Standing Balance Static Standing - Balance Support: Bilateral upper extremity supported Static Standing - Level of Assistance: 4: Min assist;3: Mod assist Dynamic Standing Balance Dynamic Standing - Balance Support: During functional activity Dynamic Standing - Level of Assistance: 3: Mod assist;2: Max assist Extremity Assessment  RUE Assessment RUE Assessment: Within Functional Limits LUE Assessment LUE Assessment: Exceptions to WFL LUE AROM (degrees) LUE Overall AROM Comments: shoulder flexion grossly 0-90*;  AROM at elbow WFL, however unable to sustain static elbow flexion position  LUE Strength LUE Overall Strength Comments: overall 3+/5 RLE Assessment RLE Assessment: Exceptions to [facility] RLE Strength RLE Overall Strength: Deficits RLE Overall Strength Comments: grossly 4-/5 throughout LLE Assessment LLE Assessment: Exceptions to [facility] LLE Strength LLE Overall Strength: Deficits LLE Overall Strength Comments: grossly 3+/5 throughout  FIM:  FIM - Engineer, site Assistive Devices: Arm rests Bed/Chair Transfer: 3: Supine > Sit: Mod A (lifting assist/Pt. 50-74%/lift 2 legs;3: Bed > Chair or W/C: Mod A (lift or lower assist);3: Chair or W/C > Bed: Mod A (lift or lower assist) FIM - Locomotion: Wheelchair Distance: 100 ft Locomotion: Wheelchair: 1: Total Assistance/staff pushes wheelchair (Pt<25%) FIM - Locomotion: Ambulation Locomotion: Ambulation Assistive Devices: Other (comment) (LUE around therapist) Ambulation/Gait Assistance: 4: Min assist;3: Mod assist Locomotion: Ambulation: 2: Travels 50 - 149 ft with moderate assistance (Pt: 50 - 74%) FIM - Locomotion: Stairs Locomotion: Scientist, physiological: Hand rail - 2 Locomotion: Stairs: 2: Up and Down 4 - 11 stairs with moderate assistance (Pt: 50 - 74%)   Refer to Care Plan for Long Term Goals  Recommendations for other services: Neuropsych  Discharge Criteria: Patient will be discharged from PT if patient refuses treatment 3 consecutive times without medical reason, if treatment goals not met, if there is a change in medical status, if patient makes no progress towards goals or if patient is discharged from hospital.  The above assessment, treatment plan, treatment alternatives and goals were discussed and mutually agreed upon: by patient  Laretta Alstrom 06/22/2014, 4:57 PM

## 2014-06-22 NOTE — Evaluation (Signed)
Occupational Therapy Assessment and Plan  Patient Details  Name: [name] MRN: [id number] Date of Birth: 1920-05-25  OT Diagnosis: abnormal posture, cognitive deficits, hemiplegia affecting non-dominant side and muscle weakness (generalized) Rehab Potential: Rehab Potential (ACUTE ONLY): Good ELOS: 18-21 days   Today's Date: 06/22/2014 OT Individual Time: 1100-1200 and 1400-1442 OT Individual Time Calculation (min): 60 min and 42 min  Missed 18 min due to fatigue     Problem List:  Patient Active Problem List   Diagnosis Date Noted  . Mild TBI 06/21/2014  . Traumatic brain injury   . SDH (subdural hematoma) 06/16/2014  . Nontraumatic hemorrhage of cerebral hemisphere   . Hypertensive urgency   . CAD (coronary artery disease) of artery bypass graft 06/15/2012  . Carotid bruit present 06/15/2012  . Raynaud phenomenon 02/08/2011  . PAF (paroxysmal atrial fibrillation) 10/09/2010  . High risk medication use 10/09/2010  . Aortic valve disease 10/09/2010  . HYPERLIPIDEMIA 06/10/2007  . HYPERTENSION 06/10/2007  . ALLERGIC RHINITIS 06/10/2007  . EMPHYSEMA 06/10/2007  . DYSPNEA 06/10/2007  . COUGH 06/10/2007    Past Medical History:  Past Medical History  Diagnosis Date  . Atrial fibrillation with rapid ventricular response Feb 2010  . Hypertension   . Flash pulmonary edema Feb 2010    Due to atrial fib with RVR  . Aortic insufficiency Feb 2010    Mild to moderate per echo with normal EF  . Arthritis   . Hyperlipidemia   . Dementia   . Vitamin D deficiency   . IBS (irritable bowel syndrome)   . PUD (peptic ulcer disease)   . Compression fracture   . Raynaud phenomenon   . CAD (coronary artery disease)     2010.  60% RCA, 60 - 70% LAD  . Carotid stenosis     40 - 59% bilateral   Past Surgical History:  Past Surgical History  Procedure Laterality Date  . Cardiac catheterization  03/11/2008    EF 45%  . Appendectomy    . Transthoracic echocardiogram  03/06/2008     EF 55%    Assessment & Plan Clinical Impression: [name] is an 81 y.o. Female with history of CAD, HTN, Afib who was admitted on 06/16/14 after a fall while going down stairs. No LOC but patient with posterior scalp laceration and left facial droop noted by family. She was evaluated in ED and found to have right thalamic hemorrhage with additional small hemorrhagic foci around right thalamic hematoma, parafalcine hematoma, trace SAH along right falx and small SDH left temporal lobe. Serial CCT done for monitoring revealing stable hematoma. 2D echo done revealing EF 55-60% with severe calcification AV with mild to moderate regurgitation and moderately calcified MV. Dr. Erlinda Hong following for input and ASA d/c due to [place]. Patient with spontaneous R-BG hemorrhage and at hight risk of bleeding if on regular AC.  Patient transferred to CIR on 06/21/2014 .    Patient currently requires mod-max A with basic self-care skills and functional transfers  secondary to muscle weakness, decreased cardiorespiratoy endurance and decreased oxygen support, impaired timing and sequencing, unbalanced muscle activation and decreased coordination, decreased awareness, decreased problem solving, decreased safety awareness and decreased memory and decreased sitting balance, decreased standing balance, decreased postural control, hemiplegia and decreased balance strategies.  Prior to hospitalization, patient could complete BADL routine with modified independent .  Patient will benefit from skilled intervention to increase independence with basic self-care skills and increase level of independence with iADL prior  to discharge home with care partner.  Anticipate patient will require 24 hour supervision and follow up home health.  OT - End of Session Activity Tolerance: Decreased this session Endurance Deficit: Yes Endurance Deficit Description: rest breaks  OT Assessment Rehab Potential (ACUTE ONLY): Good OT Patient  demonstrates impairments in the following area(s): Balance;Cognition;Endurance;Motor;Safety;Sensory OT Basic ADL's Functional Problem(s): Bathing;Dressing;Toileting;Grooming OT Transfers Functional Problem(s): Toilet;Tub/Shower OT Additional Impairment(s): Fuctional Use of Upper Extremity OT Plan OT Intensity: Minimum of 1-2 x/day, 45 to 90 minutes OT Frequency: 5 out of 7 days OT Duration/Estimated Length of Stay: 18-21 days OT Treatment/Interventions: Medical illustrator training;Community reintegration;Neuromuscular re-education;Patient/family education;Self Care/advanced ADL retraining;Therapeutic Exercise;UE/LE Coordination activities;Cognitive remediation/compensation;Discharge planning;DME/adaptive equipment instruction;Functional mobility training;Psychosocial support;Therapeutic Activities;UE/LE Strength taining/ROM;Visual/perceptual remediation/compensation OT Self Feeding Anticipated Outcome(s): n/a OT Basic Self-Care Anticipated Outcome(s): supervision OT Toileting Anticipated Outcome(s): supervision OT Bathroom Transfers Anticipated Outcome(s): supervision OT Recommendation Patient destination: Home Follow Up Recommendations: Home health OT Equipment Recommended: To be determined   Skilled Therapeutic Intervention Session 1: OT eval completed. Discussed role of OT goals, goals of therapy, possible ELOS, DME, fall risk, and safety plan. OT session focused on functional transfers, functional mobility, standing balance, attention to L, and postural control. Pt received sitting in w/c. Ambulated to bathroom with mod A as pt had LUE over therapist's shoulder. Pt initiated advancing LLE however required assist for placement and to prevent buckling. Completed toileting hygiene in sitting with min A for balance. Pt completed stand pivot transfer toilet>w/c with mod A. Engaged in bathing sit<>stand at sink with pt initiating use of LUE to wring wash cloth however required max cues for  awareness of positioning of LUE when not functionally in use. Pt required min-mod A sit<>stand 3x during session with cues for postural control due to lateral lean left and flexed posture. Pt completed dressing with self-correction on 1 occasion for hemi dressing technique. Completed oral care at sink with therapist assisting 50% of task to hold dentures due to LUE weakness. Pt left sitting in w/c with all needs in reach.   Session 2: Pt seen for 1:1 OT session with focus on postural control, L NMR, sitting balance, and functional transfers. Pt received following PT session with PT reporting SpO2 currently reading at 85%. Notified RN who provided 2L O2 with SpO2 >95% for remainder of session. Pt very fatigued upon arrival therefore completed squat pivot transfer w/c>mat table with mod A. Engaged in dynamic reaching activity to R with focus on trunk elongation and weight shifting with overall min-SBA for balance. Pt engaged in L NMR activity sitting EOM while reaching across midline to L to facilitate weight bearing. Following activity pt reported feeling "different" then reported "very tired." Transitioned to supine and vitals taken [facility]). Pt falling asleep upon supine and continued to report fatigue. Completed squat pivot transfer mat>w/c and w/c>bed with max A. Pt left supine in bed with all needs in reach. Notified RN of pt's return to bed as she was left on 2L O2. Pt missing 18 min skilled OT due to fatigue.   OT Evaluation Precautions/Restrictions  Precautions Precautions: Fall Restrictions Weight Bearing Restrictions: No General   Vital Signs Therapy Vitals Temp: 97.3 F (36.3 C) Temp Source: Oral Pulse Rate: 67 Resp: 18 BP: (!) 115/48 mmHg Patient Position (if appropriate): Lying Oxygen Therapy SpO2: 100 % O2 Device: Not Delivered Pain Pain Assessment Pain Assessment: No/denies pain Home Living/Prior Functioning Home Living Available Help at Discharge: Family, Available 24  hours/day (24/7 supervision available for a few weeks)  Type of Home: House Home Access: Stairs to enter [employer] of Steps: 5 in the front with 1 rail, 4 in the back with 2 rails Entrance Stairs-Rails: Right, Left, Can reach both Home Layout: Two level, Able to live on main level with bedroom/bathroom Alternate Level Stairs-Number of Steps: 17 steps with a landing Alternate Level Stairs-Rails: Right  Lives With: Alone Prior Function Level of Independence: Independent with transfers, Requires assistive device for independence, Independent with gait, Independent with homemaking with ambulation, Independent with basic ADLs  Able to Take Stairs?: Yes Driving: No Vocation: Retired Leisure: Hobbies-yes (Comment) Comments: likes to read, cook, and spend time with family ADL   Vision/Perception  Vision- History Baseline Vision/History: Wears glasses Wears Glasses: At all times Patient Visual Report: No change from baseline Vision- Assessment Vision Assessment?: Yes Eye Alignment: Impaired (comment) (R eye deviated inward) Ocular Range of Motion: Within Functional Limits Alignment/Gaze Preference: Within Defined Limits Tracking/Visual Pursuits: Requires cues, head turns, or add eye shifts to track Saccades: Within functional limits Convergence: Within functional limits Visual Fields: No apparent deficits  Cognition Overall Cognitive Status: Impaired/Different from baseline Arousal/Alertness: Awake/alert Orientation Level: Oriented X4 Attention: Selective Sustained Attention: Appears intact Selective Attention: Appears intact Memory: Impaired Memory Impairment: Decreased recall of new information Awareness: Impaired Awareness Impairment: Emergent impairment Problem Solving: Impaired Problem Solving Impairment: Functional basic Safety/Judgment: Appears intact Sensation Sensation Light Touch: Impaired Detail Light Touch Impaired Details: Impaired LLE;Impaired  LUE Hot/Cold: Not tested Proprioception: Impaired Detail Proprioception Impaired Details: Impaired LUE;Impaired LLE Coordination Gross Motor Movements are Fluid and Coordinated: No Fine Motor Movements are Fluid and Coordinated: No Coordination and Movement Description: L hemiplegia Finger Nose Finger Test: unable to complete LUE Motor  Motor Motor: Hemiplegia;Abnormal postural alignment and control Motor - Skilled Clinical Observations: L hemiplegia Mobility  Bed Mobility Bed Mobility: Supine to Sit Supine to Sit: 3: Mod assist Supine to Sit Details: Manual facilitation for placement;Manual facilitation for weight shifting;Verbal cues for sequencing;Verbal cues for technique Transfers Transfers: Sit to Stand;Stand to Sit Sit to Stand: 4: Min assist;3: Mod assist Sit to Stand Details: Verbal cues for sequencing;Manual facilitation for placement;Verbal cues for technique Stand to Sit: 3: Mod assist;4: Min assist Stand to Sit Details (indicate cue type and reason): Manual facilitation for placement;Verbal cues for sequencing;Verbal cues for technique  Trunk/Postural Assessment  Cervical Assessment Cervical Assessment: Exceptions to [facility] (forward head) Thoracic Assessment Thoracic Assessment: Exceptions to [facility] (rounded shoulders; kyphotic posture) Lumbar Assessment Lumbar Assessment: Exceptions to [facility] (posterior pelvic tilt) Postural Control Postural Control: Deficits on evaluation Postural Limitations: impaired  Balance Balance Balance Assessed: Yes Static Sitting Balance Static Sitting - Balance Support: Feet supported;Bilateral upper extremity supported Static Sitting - Level of Assistance: 5: Stand by assistance Dynamic Sitting Balance Dynamic Sitting - Balance Support: Feet supported;During functional activity Dynamic Sitting - Level of Assistance: 4: Min Insurance risk surveyor Standing - Balance Support: Bilateral upper extremity supported Static Standing  - Level of Assistance: 4: Min assist;3: Mod assist Dynamic Standing Balance Dynamic Standing - Balance Support: During functional activity Dynamic Standing - Level of Assistance: 3: Mod assist;4: Min assist Extremity/Trunk Assessment RUE Assessment RUE Assessment: Within Functional Limits LUE Assessment LUE Assessment: Exceptions to WFL LUE AROM (degrees) LUE Overall AROM Comments: shoulder flexion grossly 0-90*; AROM at elbow WFL, however unable to sustain static elbow flexion position  LUE Strength LUE Overall Strength Comments: overall 3+/5  FIM:  FIM - Grooming Grooming Steps: Wash, rinse, dry face;Wash, rinse, dry hands  Grooming: 3: Patient completes 2 of 4 or 3 of 5 steps FIM - Bathing Bathing Steps Patient Completed: Chest;Left Arm;Abdomen;Front perineal area;Buttocks;Right upper leg;Left upper leg Bathing: 3: Mod-Patient completes 5-7 [id number] parts or 50-74% FIM - Upper Body Dressing/Undressing Upper body dressing/undressing steps patient completed: Thread/unthread right sleeve of pullover shirt/dresss;Put head through opening of pull over shirt/dress Upper body dressing/undressing: 2: Max-Patient completed 25-49% of tasks FIM - Lower Body Dressing/Undressing Lower body dressing/undressing steps patient completed: Thread/unthread right pants leg Lower body dressing/undressing: 1: Total-Patient completed less than 25% of tasks FIM - Toileting Toileting steps completed by patient: Performs perineal hygiene (not wearing LB clothing) Toileting Assistive Devices: Grab bar or rail for support Toileting: 4: Steadying assist FIM - TAir cabin crewTransfers: 3-To toilet/BSC: Mod A (lift or lower assist);3-From toilet/BSC: Mod A (lift or lower assist)   Refer to Care Plan for Long Term Goals  Recommendations for other services: None  Discharge Criteria: Patient will be discharged from OT if patient refuses treatment 3 consecutive times without medical reason, if treatment  goals not met, if there is a change in medical status, if patient makes no progress towards goals or if patient is discharged from hospital.  The above assessment, treatment plan, treatment alternatives and goals were discussed and mutually agreed upon: by patient  PDuayne Cal5/25/2016, 2:59 PM

## 2014-06-23 ENCOUNTER — Inpatient Hospital Stay (HOSPITAL_COMMUNITY): Payer: Medicare Other | Admitting: Physical Therapy

## 2014-06-23 ENCOUNTER — Inpatient Hospital Stay (HOSPITAL_COMMUNITY): Payer: Medicare Other

## 2014-06-23 ENCOUNTER — Inpatient Hospital Stay (HOSPITAL_COMMUNITY): Payer: Medicare Other | Admitting: Occupational Therapy

## 2014-06-23 ENCOUNTER — Inpatient Hospital Stay (HOSPITAL_COMMUNITY): Payer: Medicare Other | Admitting: Speech Pathology

## 2014-06-23 NOTE — Progress Notes (Signed)
Physical Therapy Session Note  Patient Details  Name: [name] MRN: [id number] Date of Birth: 06/14/1920  Today's Date: 06/23/2014 PT Individual Time: 0930-1000 and 1430-1530  PT Individual Time Calculation (min): 30 min and 60 min   Short Term Goals: Week 1:  PT Short Term Goal 1 (Week 1): Patient will perform bed mobility with HOB flat with consistent min A.  PT Short Term Goal 2 (Week 1): Patient will transfer bed <>wheelchair with consistent min A.  PT Short Term Goal 3 (Week 1): Patient will ambulate 150 ft using LRAD with min A. PT Short Term Goal 4 (Week 1): Patient will maintain standing balance x 2 min during functional task with min A.  PT Short Term Goal 5 (Week 1): Patient will negotiate up/down 4 stairs using 2 rails with min A.   Skilled Therapeutic Interventions/Progress Updates:   Session 1: Focus on functional transfers, sitting balance, ambulation, and postural control. Patient performed squat and stand pivot transfers with mod-max A overall and mod verbal/tactile cues for sequencing. Sitting edge of mat, patient required mod/max manual cues to facilitate trunk extension and upright posture while engaging in conversation with recreational therapist. Gait training with LUE around therapist x 75 ft with max verbal/tactile cues for upright posture and forward gaze, manual facilitation for LLE advancement and placement. Patient requested to return to bed at end of session, left semi reclined with all needs within reach and bed alarm on.  Session 2: Focus on midline orientation, postural control, weight shift, standing balance, ambulation, stairs, and overall activity tolerance. Patient propelled wheelchair using R hemi technique with min-mod A and mod verbal/tactile cues for sequencing and attending to L. Stair training up/down 4 stairs using 2 rails with min-mod A, max verbal/tactile cues for sequencing, upright posture, L foot placement, and L knee extension in stance phase. In  standing, patient performed reaching to high target on right side to facilitate weight shifting to R, with mod-max A stabilizing LLE with lateral lean to left progressed to close supervision for short periods of time with improved midline orientation. With mirror for visual feedback, performed standing using HW for RUE support with focus on midline orientation, upright posture/upright head with +2 assist for safety and form, progressed to step taps using RLE to work on L knee extension in stance. Patient ambulated with B mod HHA with +2 assist x 75 ft with therapist facilitating upright posture, weight shift to R to facilitate increased LLE clearance, and cues for wider BOS and L heel strike. Patient left sitting in wheelchair with needs within reach, RN notified of patient position and Sp02 > 92% on room air throughout session.    Therapy Documentation Precautions:  Precautions Precautions: Fall Restrictions Weight Bearing Restrictions: No Pain: Pain Assessment Pain Assessment: No/denies pain Locomotion : Ambulation Ambulation/Gait Assistance: 4: Min assist;3: Mod assist   See FIM for current functional status  Therapy/Group: Individual Therapy  Kerney ElbeVarner, Ormond Lazo A 06/23/2014, 10:09 AM

## 2014-06-23 NOTE — Progress Notes (Signed)
During speech/language therapy, pt reported shortness of breath. Therapist and nurse took O2 sat at 70 on room air. Pts nails and lips became cyanotic. Patient returned to room and placed on 2 L of oxygen. O2 sat returned to 100% on 2L. Nail and lip color improved, and patient stated breathing was improved. Have reported to Ed Knisely to continue to monitor.

## 2014-06-23 NOTE — Progress Notes (Signed)
Occupational Therapy Session Note  Patient Details  Name: [name] MRN: [id number] Date of Birth: 10/26/1920  Today's Date: 06/23/2014 OT Individual Time: 1100-1200 OT Individual Time Calculation (min): 60 min    Short Term Goals: Week 1:  OT Short Term Goal 1 (Week 1): Pt will complete UB dressing with Mod A OT Short Term Goal 2 (Week 1): Pt will complete LB dressing with Mod A OT Short Term Goal 3 (Week 1): Pt will complete toileting routine with Mod A OT Short Term Goal 4 (Week 1): Pt will complete toilet transfer with Mod A  OT Short Term Goal 5 (Week 1): Pt will complete functional activity in standing for 1 min with Min A  Skilled Therapeutic Interventions/Progress Updates:    Pt seen for 1:1 OT session with focus on visual scanning, postural control, functional transfers, activity tolerance, and L NMR. Pt received supine in bed. Completed supine>sit with min A and stand pivot transfer bed>w/c and w/c<>toilet with mod A. Pt completed hygiene in standing with min-mod A standing balance, requiring assist from therapist with thoroughness. Completed hand hygiene in sitting with pt initiating using LUE to turn water on/off requiring min A to complete. Engaged in LUE WBing activity in standing with pt reaching to right to facilitate weight shift then placing item across midline to facilitate LUE WBing. Pt stood 2x with mod A overall, remaining in standing for 30 sec-2 min. Pt fatigued quickly therefore completed towel glides with LUE focusing on shoulder flexion, protraction/retraction and adduction/abduction. Pt required max cues for visually attending to LUE during activity with pt holding gaze to L for approx 3-5 seconds. Difficult to determine if pt has new visual deficit vs inattention. Daughter reporting pt used R eye primarily before secondary to cataracts. Will continue to assess. Pt left supine in bed with daughter present.  Therapy Documentation Precautions:   Precautions Precautions: Fall Restrictions Weight Bearing Restrictions: No General:   Vital Signs:  Pain: Pain Assessment Pain Assessment: No/denies pain  See FIM for current functional status  Therapy/Group: Individual Therapy  Daneil Danerkinson, Santrice Muzio N 06/23/2014, 12:05 PM

## 2014-06-23 NOTE — Progress Notes (Signed)
Stapled removed from posterior head, patient tolerated well.

## 2014-06-23 NOTE — Progress Notes (Signed)
Speech Language Pathology Daily Session Note  Patient Details  Name: [name] MRN: [id number] Date of Birth: 12/11/1920  Today's Date: 06/23/2014 SLP Individual Time: 1300-1400 SLP Individual Time Calculation (min): 60 min  Short Term Goals: Week 1: SLP Short Term Goal 1 (Week 1): Patient will attend to left field of enviornment/body during functional and familiar tasks with Min A verbal and question cues. SLP Short Term Goal 2 (Week 1): Patient will demonstrate functional problem solving for functional and familiar tasks with Min A verbal and question cues.  SLP Short Term Goal 3 (Week 1): Patient will utilize call bell to request assistance with supervision verbal cues in 75% of observable opportunities  SLP Short Term Goal 4 (Week 1): Patient will self-monitor and correct errors during functional and familiar tasks with Min A verbal and question cues.  SLP Short Term Goal 5 (Week 1): Patient will recall new, daily information with use of visual aids with Min A verbal and question cues.   Skilled Therapeutic Interventions: Skilled treatment session focused on cognitive goals. Upon arrival, patient was sitting upright in bed consuming lunch meal of regular textures and thin liquids via straw. Patient required Min A verbal cues for problem solving with self-feeding and Mod A verbal cues to self-monitor and correct attention to LUE and right lateral lean during self-feeding. Patient transferred to wheelchair with NT for safety. During session, patient did not report any discomfort or appear to be in distress, however, patient's hands appeared cyanotic, therefore, vitals were obtained and RN made aware. It was initially difficult to obtain patient's O2 saturation, however, readings ranged from 71-85%, therefore, patient was taken back to room and placed on 2L of O2 via nasal cannula. O2 saturation returned to 100%. Throughout the events, patient was talking to clinician and did not appear to be in  any distress. Patient left in wheelchair with quick release belt in place and all needs within reach. Continue with current plan of care.    FIM:  Comprehension Comprehension Mode: Auditory Comprehension: 5-Follows basic conversation/direction: With extra time/assistive device Expression Expression Mode: Verbal Expression: 5-Expresses basic needs/ideas: With no assist Social Interaction Social Interaction: 6-Interacts appropriately with others with medication or extra time (anti-anxiety, antidepressant). Problem Solving Problem Solving: 3-Solves basic 50 - 74% of the time/requires cueing 25 - 49% of the time Memory Memory: 4-Recognizes or recalls 75 - 89% of the time/requires cueing 10 - 24% of the time  Pain Pain Assessment Pain Assessment: No/denies pain  Therapy/Group: Individual Therapy  Dayannara Pascal 06/23/2014, 4:16 PM

## 2014-06-23 NOTE — Plan of Care (Signed)
After speaking with treatment team, patient's plan of care changed to 3.5 hours of therapy as patient unable to tolerate current therapy schedule.  [name], MS, OTR/L

## 2014-06-23 NOTE — Progress Notes (Signed)
[place] PHYSICAL MEDICINE & REHABILITATION     PROGRESS NOTE    Subjective/Complaints: Slept well. Pleased with therapy yesterday. Was tired by end of day. Pt denies nausea, vomiting, abdominal pain, diarrhea, chest pain, shortness of breath, palpitations, dizziness   Objective: Vital Signs: Blood pressure 110/55, pulse 72, temperature 98 F (36.7 C), temperature source Oral, resp. rate 18, height 5\' 6"  (1.676 m), weight 48.8 kg (107 lb 9.4 oz), SpO2 95 %. No results found.  Recent Labs  06/22/14 0543  WBC 7.8  HGB 10.4*  HCT 32.2*  PLT 247    Recent Labs  06/22/14 0543  NA 134*  K 4.1  CL 99*  GLUCOSE 102*  BUN 15  CREATININE 0.61  CALCIUM 9.0   CBG (last 3)  No results for input(s): GLUCAP in the last 72 hours.  Wt Readings from Last 3 Encounters:  06/22/14 48.8 kg (107 lb 9.4 oz)  06/16/14 50.6 kg (111 lb 8.8 oz)  06/07/14 52.164 kg (115 lb)    Physical Exam:  Constitutional: She is oriented to person, place, and time. She appears well-developed and well-nourished. No distress.  Thin elderly female. Fatigued appearing but NAD.  HENT:  Head: Normocephalic.  Right Ear: External ear normal.  Left Ear: External ear normal.  Posterior scalp with stapled laceration.  Eyes: Conjunctivae and EOM are normal. Pupils are equal, round, and reactive to light.  Neck: Normal range of motion. Neck supple. No JVD present. No tracheal deviation present. No thyromegaly present.  Cardiovascular: Normal rate. An irregularly irregular rhythm present.  Systolic murmur heard. Respiratory: Effort normal and breath sounds normal. No respiratory distress. She has no wheezes.  GI: Soft. Bowel sounds are normal. She exhibits no distension. There is no tenderness. There is no rebound.  Musculoskeletal: She exhibits no edema or tenderness.  Kyphosis noted. Diffuse ecchymosis right forearm with healing abraded area on elbow healing.  Neurological: She is alert and oriented to  person, place, and time.  Patient is oriented to person, place and date of birth. Remains very alert.  Good attention LUE and LLE 4/5 deltoid,bicep, tricep, wrist, HF, KE and ankle. Trace sensory loss along left face and left leg and arm. No resting tremor or rigidity.  Skin: Skin is warm and dry. She is not diaphoretic.  Psychiatric: She has a normal mood and affect. Her speech is normal and behavior is normal. Cognition and memory are normal.     Assessment/Plan: 1. Functional deficits secondary to TBI after fall which require 3+ hours per day of interdisciplinary therapy in a comprehensive inpatient rehab setting. Physiatrist is providing close team supervision and 24 hour management of active medical problems listed below. Physiatrist and rehab team continue to assess barriers to discharge/monitor patient progress toward functional and medical goals. FIM: FIM - Bathing Bathing Steps Patient Completed: Chest, Left Arm, Abdomen, Front perineal area, Buttocks, Right upper leg, Left upper leg Bathing: 3: Mod-Patient completes 5-7 1573f 10 parts or 50-74%  FIM - Upper Body Dressing/Undressing Upper body dressing/undressing steps patient completed: Thread/unthread right sleeve of pullover shirt/dresss, Put head through opening of pull over shirt/dress Upper body dressing/undressing: 2: Max-Patient completed 25-49% of tasks FIM - Lower Body Dressing/Undressing Lower body dressing/undressing steps patient completed: Thread/unthread right pants leg Lower body dressing/undressing: 1: Total-Patient completed less than 25% of tasks  FIM - Toileting Toileting steps completed by patient: Performs perineal hygiene Toileting Assistive Devices: Grab bar or rail for support Toileting: 2: Max-Patient completed 1 of 3 steps  FIM - Diplomatic Services operational officer Devices: Grab bars Toilet Transfers: 3-To toilet/BSC: Mod A (lift or lower assist), 3-From toilet/BSC: Mod A (lift or lower  assist)  FIM - Banker Devices: Arm rests Bed/Chair Transfer: 3: Supine > Sit: Mod A (lifting assist/Pt. 50-74%/lift 2 legs, 3: Bed > Chair or W/C: Mod A (lift or lower assist), 3: Chair or W/C > Bed: Mod A (lift or lower assist)  FIM - Locomotion: Wheelchair Distance: 100 ft Locomotion: Wheelchair: 1: Total Assistance/staff pushes wheelchair (Pt<25%) FIM - Locomotion: Ambulation Locomotion: Ambulation Assistive Devices: Other (comment) (LUE around therapist) Ambulation/Gait Assistance: 4: Min assist, 3: Mod assist Locomotion: Ambulation: 2: Travels 50 - 149 ft with moderate assistance (Pt: 50 - 74%)  Comprehension Comprehension Mode: Auditory Comprehension: 5-Understands complex 90% of the time/Cues < 10% of the time  Expression Expression Mode: Verbal Expression: 5-Expresses basic needs/ideas: With no assist  Social Interaction Social Interaction: 6-Interacts appropriately with others with medication or extra time (anti-anxiety, antidepressant).  Problem Solving Problem Solving Mode: Not assessed Problem Solving: 5-Solves basic problems: With no assist  Memory Memory: 5-Recognizes or recalls 90% of the time/requires cueing < 10% of the time  Medical Problem List and Plan: 1. Functional deficits secondary to TBI after fall  2. DVT Prophylaxis/Anticoagulation: Mechanical: Sequential compression devices, below knee Bilateral lower extremities 3. Pain Management: Tylenol prn.  4. Mood: On Celexa daily. LCSW to follow for evaluation and support.  5. Neuropsych: This patient is capable of making decisions on her own behalf. 6. Skin/Wound Care: Routine pressure relief measures. Scalp laceration healing well--remove scalp staples early next week  7. Fluids/Electrolytes/Nutrition: Monitor I/O.  Encourage adequate po intake. Labs normal today  8. HTN: Monitor BP every 8 hours. Has been labile and medications increased to bid. H/o  orthostasis in the past--monitor for symptoms.  9. Dyslipidemia: On hold due to ICH. Resume at discharge. 10. ABLA:  iron supplement.  11. PAF with h/o flash edema due to RVR: On amiodarone.  HR occasionally >100.  12. CAD: Has been managed medically without symptoms.  13. IBS: diet/supps LOS (Days) 2 A FACE TO FACE EVALUATION WAS PERFORMED  SWARTZ,ZACHARY T 06/23/2014 8:08 AM

## 2014-06-23 NOTE — Progress Notes (Signed)
Occupational Therapy Session Note  Patient Details  Name: Mervyn Skeetersula W Celmer MRN: [id number] Date of Birth: 02/12/1920  Today's Date: 06/23/2014 OT Individual Time: [id number] OT Individual Time Calculation (min): 60 min   Short Term Goals: Week 1:  OT Short Term Goal 1 (Week 1): Pt will complete UB dressing with Mod A OT Short Term Goal 2 (Week 1): Pt will complete LB dressing with Mod A OT Short Term Goal 3 (Week 1): Pt will complete toileting routine with Mod A OT Short Term Goal 4 (Week 1): Pt will complete toilet transfer with Mod A  OT Short Term Goal 5 (Week 1): Pt will complete functional activity in standing for 1 min with Min A  Skilled Therapeutic Interventions/Progress Updates:  Patient received supine in bed with HOB raised eating breakfast. Patient oriented X4. Pt engaged in bed mobility with mod assist for trunk support. Pt sat EOB for donning of socks with increased left lateral lean. Pt transferred EOB>w/c with mod/max assist. Assisted with propelling pt into BR for toilet transfer and toielting needs. Pt max assist for toilet transfer and cm/h tasks. Pt completed UB/LB bathing and dressing in sit<>stand position at sink. Pt required max multimodal cues throughout session to stay at midline in sitting and standing. Min verbal cueing required for initiation, problem solving, sequencing. Throughout session, pt took more than a reasonable amount of time to complete tasks. Pt aware of this, but this therapist encouraged pt to be as independent as possible. At end of session, left pt seated in w/c with RN present.    Therapy Documentation Precautions:  Precautions Precautions: Fall Restrictions Weight Bearing Restrictions: No  See FIM for current functional status  Therapy/Group: Individual Therapy  Isaiha Asare , MS, OTR/L, CLT Pager: [phone]  06/23/2014, 9:34 AM

## 2014-06-23 NOTE — Care Management Note (Signed)
[facility]patient Rehabilitation Center Individual Statement of Services  Patient Name:  [name]  Date:  06/23/2014  Welcome to the [facility]patient Rehabilitation [facility].  Our goal is to provide you with an individualized program based on your diagnosis and situation, designed to meet your specific needs.  With this comprehensive rehabilitation program, you will be expected to participate in at least 3 hours of rehabilitation therapies Monday-Friday, with modified therapy programming on the weekends.  Your rehabilitation program will include the following services:  Physical Therapy (PT), Occupational Therapy (OT), Speech Therapy (ST), 24 hour per day rehabilitation nursing, Therapeutic Recreaction (TR), Neuropsychology, Case Management (Social Worker), Rehabilitation Medicine, Nutrition Services and Pharmacy Services  Weekly team conferences will be held on Tuesdays to discuss your progress.  Your Social Worker will talk with you frequently to get your input and to update you on team discussions.  Team conferences with you and your family in attendance may also be held.  Expected length of stay: 2-3 weeks  Overall anticipated outcome: supervision  Depending on your progress and recovery, your program may change. Your Social Worker will coordinate services and will keep you informed of any changes. Your Social Worker's name and contact numbers are listed  below.  The following services may also be recommended but are not provided by the Inpatient Rehabilitation Center:    Home Health Rehabiltiation Services  Outpatient Rehabilitation Services    Arrangements will be made to provide these services after discharge if needed.  Arrangements include referral to agencies that provide these services.  Your insurance has been verified to be:  [place] Your primary doctor is:  Dr. Waynard EdwardsPerini  Pertinent information will be shared with your doctor and your insurance company.  Social Worker:  [place]ang[place] ParkLucy Adien Kimmel, [place]  [phone] or ([phone]) [phone]   Information discussed with and copy given to patient by: Amada JupiterHOYLE, Abram Sax, 06/23/2014, 1:01 PM

## 2014-06-23 NOTE — Progress Notes (Signed)
Recreational Therapy Assessment and Plan  Patient Details  Name: [name] MRN: [id number] Date of Birth: 1920/09/07 Today's Date: 06/23/2014  Rehab Potential: Good ELOS: 3 weeks   Assessment Clinical Impression:  Problem List:  Patient Active Problem List   Diagnosis Date Noted  . Mild TBI 06/21/2014  . Traumatic brain injury   . SDH (subdural hematoma) 06/16/2014  . Nontraumatic hemorrhage of cerebral hemisphere   . Hypertensive urgency   . CAD (coronary artery disease) of artery bypass graft 06/15/2012  . Carotid bruit present 06/15/2012  . Raynaud phenomenon 02/08/2011  . PAF (paroxysmal atrial fibrillation) 10/09/2010  . High risk medication use 10/09/2010  . Aortic valve disease 10/09/2010  . HYPERLIPIDEMIA 06/10/2007  . HYPERTENSION 06/10/2007  . ALLERGIC RHINITIS 06/10/2007  . EMPHYSEMA 06/10/2007  . DYSPNEA 06/10/2007  . COUGH 06/10/2007    Past Medical History:  Past Medical History  Diagnosis Date  . Atrial fibrillation with rapid ventricular response Feb 2010  . Hypertension   . Flash pulmonary edema Feb 2010    Due to atrial fib with RVR  . Aortic insufficiency Feb 2010    Mild to moderate per echo with normal EF  . Arthritis   . Hyperlipidemia   . Dementia   . Vitamin D deficiency   . IBS (irritable bowel syndrome)   . PUD (peptic ulcer disease)   . Compression fracture   . Raynaud phenomenon   . CAD (coronary artery disease)     2010. 60% RCA, 60 - 70% LAD  . Carotid stenosis     40 - 59% bilateral   Past Surgical History:  Past Surgical History  Procedure Laterality Date  . Cardiac catheterization  03/11/2008    EF 45%  . Appendectomy    . Transthoracic echocardiogram  03/06/2008    EF 55%    Assessment & Plan Clinical Impression: [name] is an 81 y.o. Female with history of CAD, HTN, Afib who was  admitted on 06/16/14 after a fall while going down stairs. No LOC but patient with posterior scalp laceration and left facial droop noted by family. She was evaluated in ED and found to have right thalamic hemorrhage with additional small hemorrhagic foci around right thalamic hematoma, parafalcine hematoma, trace SAH along right falx and small SDH left temporal lobe. Serial CCT done for monitoring revealing stable hematoma. 2D echo done revealing EF 55-60% with severe calcification AV with mild to moderate regurgitation and moderately calcified MV. Dr. Erlinda Hong following for input and ASA d/c due to [place]. Patient with spontaneous R-BG hemorrhage and at hight risk of bleeding if on regular AC. Patient transferred to CIR on 06/21/2014.       Pt presents with decreased activity tolerance, decreased oxygen support,  decreased functional mobility, decreased balance, decreased coordination, decreased memory, decreased problem solving, decreased safety, decreased safety awareness Limiting pt's independence with leisure/community pursuits.   Leisure History/Participation Other Leisure Interests: Reading;Housework ("make a path") Leisure Participation Style: Alone;With Family/Friends Awareness of Community Resources: Good-identify 3 post discharge leisure resources Psychosocial / Spiritual Spiritual Interests: Organ: Cooperative Academic librarian Appropriate for Education?: Yes Recreational Therapy Orientation Orientation -Reviewed with patient: Available activity resources Strengths/Weaknesses Patient Strengths/Abilities: Willingness to participate Patient weaknesses: Physical limitations TR Patient demonstrates impairments in the following area(s): Endurance;Motor;Safety;Skin Integrity  Plan Rec Therapy Plan Is patient appropriate for Therapeutic Recreation?: Yes Rehab Potential: Good Treatment times per week: Min 1 time per week >20 minutes Estimated  Length of  Stay: 3 weeks TR Treatment/Interventions: Adaptive equipment instruction;1:1 session;Balance/vestibular training;Functional mobility training;Community reintegration;Cognitive remediation/compensation;Patient/family education;Therapeutic activities;Recreation/leisure participation;Therapeutic exercise;UE/LE Coordination activities  Recommendations for other services: None  Discharge Criteria: Patient will be discharged from TR if patient refuses treatment 3 consecutive times without medical reason.  If treatment goals not met, if there is a change in medical status, if patient makes no progress towards goals or if patient is discharged from hospital.  The above assessment, treatment plan, treatment alternatives and goals were discussed and mutually agreed upon: by patient  El Tumbao 06/23/2014, 1:31 PM

## 2014-06-23 NOTE — IPOC Note (Signed)
Overall Plan of Care [facility]) Patient Details Name: [name] MRN: [id number] DOB: [birth year]  Admitting Diagnosis: Traumatic SDH with ICH  Hospital Problems: Principal Problem:   Mild TBI Active Problems:   PAF (paroxysmal atrial fibrillation)   CAD (coronary artery disease) of artery bypass graft     Functional Problem List: Nursing Skin Integrity, Safety  PT Balance, Endurance, Motor, Nutrition, Pain, Perception, Safety, Sensory, Skin Integrity  OT Balance, Cognition, Endurance, Motor, Safety, Sensory  SLP Cognition  TR Endurance, Motor, Safety, Skin Integrity       Basic ADL's: OT Bathing, Dressing, Toileting, Grooming     Advanced  ADL's: OT       Transfers: PT Bed Mobility, Bed to Chair, Car, Occupational psychologist, Research scientist (life sciences): PT Ambulation, Psychologist, prison and probation services, Stairs     Additional Impairments: OT Fuctional Use of Upper Extremity  SLP Social Cognition   Problem Solving, Memory, Attention, Awareness  TR      Anticipated Outcomes Item Anticipated Outcome  Self Feeding n/a  Swallowing      Basic self-care  supervision  Toileting  supervision   Bathroom Transfers supervision  Bowel/Bladder  Mod I  Transfers  supervision  Locomotion  supervision  Communication     Cognition  Supervision   Pain  <3  Safety/Judgment  Supervision   Therapy Plan: PT Intensity: Minimum of 1-2 x/day ,45 to 90 minutes PT Frequency: 5 out of 7 days PT Duration Estimated Length of Stay: 17-21 days OT Intensity: Minimum of 1-2 x/day, 45 to 90 minutes OT Frequency: 5 out of 7 days OT Duration/Estimated Length of Stay: 18-21 days SLP Intensity: Minumum of 1-2 x/day, 30 to 90 minutes SLP Frequency: 3 to 5 out of 7 days SLP Duration/Estimated Length of Stay: 2.5-3 weeks        Team Interventions: Nursing Interventions Disease Management/Prevention, Skin Care/Wound Management  PT interventions Ambulation/gait training, Warden/ranger,  Community reintegration, Discharge planning, Disease management/prevention, DME/adaptive equipment instruction, Functional mobility training, Neuromuscular re-education, Pain management, Patient/family education, Psychosocial support, Stair training, Therapeutic Activities, Therapeutic Exercise, UE/LE Strength taining/ROM, UE/LE Coordination activities, Visual/perceptual remediation/compensation, Wheelchair propulsion/positioning  OT Interventions Warden/ranger, Firefighter, Chief of Staff, Patient/family education, Self Care/advanced ADL retraining, Therapeutic Exercise, UE/LE Coordination activities, Cognitive remediation/compensation, Discharge planning, DME/adaptive equipment instruction, Functional mobility training, Psychosocial support, Therapeutic Activities, UE/LE Strength taining/ROM, Visual/perceptual remediation/compensation  SLP Interventions Cognitive remediation/compensation, Cueing hierarchy, Functional tasks, Internal/external aids, Environmental controls, Patient/family education, Therapeutic Activities  TR Interventions Adaptive equipment instruction, 1:1 session, Warden/ranger, Functional mobility training, Community reintegration, Cognitive remediation/compensation, Equities trader education, Therapeutic activities, Recreation/leisure participation, Therapeutic exercise, UE/LE Coordination activities  SW/CM Interventions Discharge Planning, Facilities manager, Patient/Family Education    Team Discharge Planning: Destination: PT-Home ,OT- Home , SLP-Home Projected Follow-up: PT-24 hour supervision/assistance, Home health PT, OT-  Home health OT, SLP-24 hour supervision/assistance, Home Health SLP Projected Equipment Needs: PT-To be determined, OT- To be determined, SLP-None recommended by SLP Equipment Details: PT- , OT-  Patient/family involved in discharge planning: PT- Patient,  OT-Patient, SLP-Patient  MD ELOS: 17-21  days Medical Rehab Prognosis:  Excellent Assessment: The patient has been admitted for CIR therapies with the diagnosis of TBI. The team will be addressing functional mobility, strength, stamina, balance, safety, adaptive techniques and equipment, self-care, bowel and bladder mgt, patient and caregiver education, NMR, cognitive perceptual awareness, communication, activity tolerance, community reintegration. Goals have been set at supervision for mobility, self-care, and cognition. Therapy intensity will need to be decreased initially  due limited activity tolerance.    Ranelle OysterZachary T. Swartz, MD, FAAPMR      See Team Conference Notes for weekly updates to the plan of care

## 2014-06-24 ENCOUNTER — Inpatient Hospital Stay (HOSPITAL_COMMUNITY): Payer: Medicare Other | Admitting: Speech Pathology

## 2014-06-24 ENCOUNTER — Inpatient Hospital Stay (HOSPITAL_COMMUNITY): Payer: Medicare Other | Admitting: Physical Therapy

## 2014-06-24 ENCOUNTER — Inpatient Hospital Stay (HOSPITAL_COMMUNITY): Payer: Medicare Other

## 2014-06-24 DIAGNOSIS — S06893S Other specified intracranial injury with loss of consciousness of 1 hour to 5 hours 59 minutes, sequela: Secondary | ICD-10-CM

## 2014-06-24 MED ORDER — ENSURE ENLIVE PO LIQD
237.0000 mL | Freq: Every day | ORAL | Status: DC
  Administered 2014-06-24 – 2014-06-29 (×6): 237 mL via ORAL

## 2014-06-24 NOTE — Progress Notes (Signed)
Nursing expressed concerns about patient's intermittent hypoxia as well as episodes of fingers turning blue.  Seen with OT--needed 90 minutes to complete ADL tasks. Pulse oxy with variation--down to 70s for about 10 seconds but rebounds to 90s without tachycardia. Patient denies CP, dyspnea or SOB.  She does have Raynaud's which will affect pulse Ox readings. Will check CXR to rule out acute process.

## 2014-06-24 NOTE — Progress Notes (Signed)
Speech Language Pathology Daily Session Note  Patient Details  Name: [name] MRN: [id number] Date of Birth: 08/26/1920  Today's Date: 06/24/2014 SLP Individual Time: 1400-1500 SLP Individual Time Calculation (min): 60 min  Short Term Goals: Week 1: SLP Short Term Goal 1 (Week 1): Patient will attend to left field of enviornment/body during functional and familiar tasks with Min A verbal and question cues. SLP Short Term Goal 2 (Week 1): Patient will demonstrate functional problem solving for functional and familiar tasks with Min A verbal and question cues.  SLP Short Term Goal 3 (Week 1): Patient will utilize call bell to request assistance with supervision verbal cues in 75% of observable opportunities  SLP Short Term Goal 4 (Week 1): Patient will self-monitor and correct errors during functional and familiar tasks with Min A verbal and question cues.  SLP Short Term Goal 5 (Week 1): Patient will recall new, daily information with use of visual aids with Min A verbal and question cues.   Skilled Therapeutic Interventions: Skilled treatment session focused on cognitive goals. Primary SLP asked this SLP to check how she is with swallowing thin liquids, as she had some coughing yesterday. Patient consumed straw sips of thin liquids with no overt s/s aspiration or difficulty. SLP directed patient in completing complex-level check register task. 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 She estimated amounts of bills that fluctuate (gas bill, etc). She then used calculator to subract all of these bill amounts from her monthly check she gets to calculate how much money she has after paying bills. Patient required minimal cues for setting up  check register, but had difficulty with calculating amounts and using calculator effectively. She stated that this was hard for her and she might "let my daughter  handle this for me". Patient stated that she feels that overall she has improved, but some days she feels like she isn't able to do things well. Patient is very motivated and hard working. Continue with plan of care.   FIM:  Comprehension Comprehension Mode: Auditory Comprehension: 5-Understands complex 90% of the time/Cues < 10% of the time Expression Expression Mode: Verbal Expression: 5-Expresses basic needs/ideas: With no assist Social Interaction Social Interaction: 6-Interacts appropriately with others with medication or extra time (anti-anxiety, antidepressant). Problem Solving Problem Solving: 5-Solves basic problems: With no assist Memory Memory: 5-Recognizes or recalls 90% of the time/requires cueing < 10% of the time  Pain Pain Assessment Pain Assessment: No/denies pain  Therapy/Group: Individual Therapy  Pablo Lawrencereston, April Tarrell 06/24/2014, 4:36 PM  Pablo LawrenceJohn Tarrell Preston, MA, CCC-SLP

## 2014-06-24 NOTE — Progress Notes (Signed)
Occupational Therapy Session Note  Patient Details  Name: [name] MRN: [id number] Date of Birth: 02/08/1920  Today's Date: 06/24/2014 OT Individual Time: 0730-0900 OT Individual Time Calculation (min): 90 min    Short Term Goals: Week 1:  OT Short Term Goal 1 (Week 1): Pt will complete UB dressing with Mod A OT Short Term Goal 2 (Week 1): Pt will complete LB dressing with Mod A OT Short Term Goal 3 (Week 1): Pt will complete toileting routine with Mod A OT Short Term Goal 4 (Week 1): Pt will complete toilet transfer with Mod A  OT Short Term Goal 5 (Week 1): Pt will complete functional activity in standing for 1 min with Min A  Skilled Therapeutic Interventions/Progress Updates: ADL-retraining with focus on endurance, static/dynamic sitting balance, dressing skills, and static standing balance.   Pt received supine with HOB elevated and daughter present encouraging intake of food.   With re-orientation to task, pt accepted female therapist role in planned activity: bathing/dressing/grooming qualified with use of RN to preserve her modesty during lower body BADL.    Pt able to rise to EOB with mod assist to lift legs, extra time, and steadying assist to maintain unsupported sitting balance.   With mod vc for technique, pt rose to stand w/hand guidance and completed stand pivot transfer to w/c with max assist initially to perform pivot to her right d/t left lateral lean.    Pt required frequent cues (max verbal) to progress through bathing/dressing task and was overall max-total assist to bathe d/t poor postural control and max assist to dress d/t impaired LUE function, although sufficient in tasks for grasp/pinch at left hand.   Pt required mod vc to attend to LUE throughout session although aware of deficit.   02 sats varied from low of 78% to 93% during dynamic sitting activity and was unimproved by supplemental 02 at 2 L, but improved within 20 seconds w/o when cued to breathe deeply with upright  posture.  Per dw PA-C, pt's presentation of low 02 sats is likely due to Raynaud's phenomenon and she advised use of incentive spirometer and vc to breathe deeply versus incorporate use of supplemental oxygen.   Pt left in w/c at end of session as PT arrived for continued treatment.         Therapy Documentation Precautions:  Precautions Precautions: Fall Restrictions Weight Bearing Restrictions: No  Pain: No/denies pain    See FIM for current functional status  Therapy/Group: Individual Therapy  Mileidy Atkin 06/24/2014, 3:05 PM

## 2014-06-24 NOTE — Progress Notes (Signed)
[facility]     PROGRESS NOTE    Subjective/Complaints: Tired but no complaints, is going to x-ray but patient unaware what is being x-rayed. Pt denies nausea, vomiting, abdominal pain, diarrhea, chest pain, shortness of breath, palpitations, dizziness   Objective: Vital Signs: Blood pressure 125/109, pulse 71, temperature 98.2 F (36.8 C), temperature source Oral, resp. rate 18, height  (1.676 m), weight 48.8 kg (107 lb 9.4 oz), SpO2 79 %. Dg Chest 2 View  06/24/2014   CLINICAL DATA:  Hypoxia.  Fall 1 week ago.  EXAM: CHEST  2 VIEW  COMPARISON:  06/17/2014 and 10/04/2008  FINDINGS: Lungs are hyperexpanded demonstrate stable upper lobe/ biapical pleural parenchymal calcification. No focal lobar consolidation or effusion. Borderline stable cardiomegaly. Mild calcified plaque over the aorta. Degenerative changes of the spine.  IMPRESSION: No acute cardiopulmonary disease.  COPD and stable pleural parenchymal calcification over the upper lobe/ apices.   Electronically Signed   By: Elberta Fortis M.D.   On: 06/24/2014 10:13    Recent Labs  06/22/14 0543  WBC 7.8  HGB 10.4*  HCT 32.2*  PLT 247    Recent Labs  06/22/14 0543  NA 134*  K 4.1  CL 99*  GLUCOSE 102*  BUN 15  CREATININE 0.61  CALCIUM 9.0   CBG (last 3)  No results for input(s): GLUCAP in the last 72 hours.  Wt Readings from Last 3 Encounters:  06/22/14 48.8 kg (107 lb 9.4 oz)  06/16/14 50.6 kg (111 lb 8.8 oz)  06/07/14 52.164 kg (115 lb)    Physical Exam:  Constitutional: She is oriented to person, place, and time. She appears well-developed and well-nourished. No distress.  Thin elderly female. Fatigued appearing but NAD.  HENT:  Head: Normocephalic.   Eyes: Conjunctivae and EOM are normal. Pupils are equal, round, and reactive to light.  Neck: Normal range of motion. Neck supple. No JVD present. No tracheal deviation present. No thyromegaly present.  Cardiovascular:  Normal rate. An irregularly irregular rhythm present.  Systolic murmur heard. Respiratory: Effort normal and breath sounds normal. No respiratory distress. She has no wheezes.  GI: Soft. Bowel sounds are normal. She exhibits no distension. There is no tenderness. There is no rebound.  Musculoskeletal: She exhibits no edema or tenderness.  Kyphosis noted. Diffuse ecchymosis right forearm with healing abraded area on elbow healing.  Neurological: She is alert and oriented to person, place, and time.  Patient is oriented to person, place and date of birth. Remains very alert.  Good attention LUE and LLE 4/5 deltoid,bicep, tricep, wrist, HF, KE and ankle. Trace sensory loss along left face and left leg and arm. No resting tremor or rigidity.  Skin: Skin is warm and dry. She is not diaphoretic.  Psychiatric: She has a normal mood and affect.    Assessment/Plan: 1. Functional deficits secondary to TBI after fall which require 3+ hours per day of interdisciplinary therapy in a comprehensive inpatient rehab setting. Physiatrist is providing close team supervision and 24 hour management of active medical problems listed below. Physiatrist and rehab team continue to assess barriers to discharge/monitor patient progress toward functional and medical goals. Reports of intermittent hypoxia, chest x-ray negative, patient asymptomatic, we'll monitor FIM: FIM - Bathing Bathing Steps Patient Completed: Chest, Left Arm, Abdomen, Front perineal area, Buttocks, Right upper leg, Left upper leg Bathing: 3: Mod-Patient completes 5-7 75f 10 parts or 50-74% (mod assist for sit<>stand and to maintain dynamic standing)  FIM -  Upper Body Dressing/Undressing Upper body dressing/undressing steps patient completed: Thread/unthread right sleeve of pullover shirt/dresss, Put head through opening of pull over shirt/dress, Thread/unthread left sleeve of pullover shirt/dress, Pull shirt over trunk Upper body  dressing/undressing: 4: Min-Patient completed 75 plus % of tasks FIM - Lower Body Dressing/Undressing Lower body dressing/undressing steps patient completed: Thread/unthread right pants leg, Thread/unthread right underwear leg, Thread/unthread left underwear leg Lower body dressing/undressing: 2: Max-Patient completed 25-49% of tasks (mod assist for sit<>stand and to maintain dynamic standing)  FIM - Toileting Toileting steps completed by patient: Performs perineal hygiene Toileting Assistive Devices: Grab bar or rail for support Toileting: 1: Total-Patient completed zero steps, helper did all 3  FIM - Diplomatic Services operational officerToilet Transfers Toilet Transfers Assistive Devices: Grab bars, Elevated toilet seat Toilet Transfers: 3-To toilet/BSC: Mod A (lift or lower assist), 3-From toilet/BSC: Mod A (lift or lower assist)  FIM - BankerBed/Chair Transfer Bed/Chair Transfer Assistive Devices: Arm rests Bed/Chair Transfer: 2: Chair or W/C > Bed: Max A (lift and lower assist), 3: Bed > Chair or W/C: Mod A (lift or lower assist), 3: Supine > Sit: Mod A (lifting assist/Pt. 50-74%/lift 2 legs, 2: Sit > Supine: Max A (lifting assist/Pt. 25-49%)  FIM - Locomotion: Wheelchair Distance: 100 ft Locomotion: Wheelchair: 4: Travels 150 ft or more: maneuvers on rugs and over door sillls with minimal assistance (Pt.>75%) FIM - Locomotion: Ambulation Locomotion: Ambulation Assistive Devices: Other (comment) (B HHA) Ambulation/Gait Assistance: 1: +2 Total assist Locomotion: Ambulation: 0: Activity did not occur  Comprehension Comprehension Mode: Auditory Comprehension: 5-Understands complex 90% of the time/Cues < 10% of the time  Expression Expression Mode: Verbal Expression: 5-Expresses basic needs/ideas: With no assist  Social Interaction Social Interaction: 6-Interacts appropriately with others with medication or extra time (anti-anxiety, antidepressant).  Problem Solving Problem Solving Mode: Not assessed Problem Solving:  5-Solves basic problems: With no assist  Memory Memory: 5-Recognizes or recalls 90% of the time/requires cueing < 10% of the time  Medical Problem List and Plan: 1. Functional deficits secondary to TBI after fall  2. DVT Prophylaxis/Anticoagulation: Mechanical: Sequential compression devices, below knee Bilateral lower extremities 3. Pain Management: Tylenol prn.  4. Mood: On Celexa daily. LCSW to follow for evaluation and support.  5. Neuropsych: This patient is capable of making decisions on her own behalf. 6. Skin/Wound Care: Routine pressure relief measures. Scalp laceration healing well--remove scalp staples early next week  7. Fluids/Electrolytes/Nutrition: Monitor I/O.  Encourage adequate po intake. Labs normal today  8. HTN: Monitor BP every 8 hours. Has been labile and medications increased to bid. H/o orthostasis in the past--monitor for symptoms.  9. Dyslipidemia: On hold due to ICH. Resume at discharge. 10. ABLA:  iron supplement.  11. PAF with h/o flash edema due to RVR: On amiodarone.  HR occasionally >100.  12. CAD: Has been managed medically without symptoms.  13. IBS: diet/supps LOS (Days) 3 A FACE TO FACE EVALUATION WAS PERFORMED  KIRSTEINS,ANDREW E 06/24/2014 10:45 AM

## 2014-06-24 NOTE — Progress Notes (Signed)
Initial Nutrition Assessment  DOCUMENTATION CODES:  Non-severe (moderate) malnutrition in context of chronic illness, Underweight   Pt meets criteria for MODERATE MALNUTRITION in the context of chronic illness as evidenced by moderate fat mass loss and severe muscle mass loss.  INTERVENTION:  Ensure Enlive (each supplement provides 350kcal and 20 grams of protein), Magic cup   Encourage adequate PO intake.  NUTRITION DIAGNOSIS:  Inadequate oral intake related to  (decreased appetite) as evidenced by  (varied meal completion of 20-100%).  GOAL:  Patient will meet greater than or equal to 90% of their needs  MONITOR:  PO intake, Supplement acceptance, Weight trends, Labs, I & O's  REASON FOR ASSESSMENT:   (Low BMI)    ASSESSMENT: Pt with history of CAD, HTN, Afib who was admitted on 06/16/14 after a fall while going down stairs. Pt found to have right thalamic hemorrhage with additional small hemorrhagic foci. CCT done for monitoring revealing stable hematoma.  Pt reports having a decreased appetite since admission. PTA she reports eating fine with 3 meals a day and an Ensure with no other difficulties. Current meal completion is varied from 20-100%. Pt reports usual body weight of 111-114 lbs. RD to order Ensure to aid in caloric and protein needs Pt was encouraged to eat her food at meals and to drink her supplements.   Nutrition-Focused physical exam completed. Findings are moderate fat depletion, severe muscle depletion, and none edema.   Labs and medications reviewed.  Height:  Ht Readings from Last 1 Encounters:  06/21/14 5\' 6"  (1.676 m)    Weight:  Wt Readings from Last 1 Encounters:  06/22/14 107 lb 9.4 oz (48.8 kg)    Ideal Body Weight:  59 kg  Wt Readings from Last 10 Encounters:  06/22/14 107 lb 9.4 oz (48.8 kg)  06/16/14 111 lb 8.8 oz (50.6 kg)  06/07/14 115 lb (52.164 kg)  12/01/13 108 lb 6.4 oz (49.17 kg)  06/02/13 103 lb 6.4 oz (46.902 kg)   11/03/12 109 lb (49.442 kg)  06/15/12 109 lb (49.442 kg)  02/08/11 115 lb (52.164 kg)  10/30/10 112 lb (50.803 kg)  10/09/10 112 lb (50.803 kg)    BMI:  Body mass index is 17.37 kg/(m^2). Underweight  Estimated Nutritional Needs:  Kcal:  1550-1750  Protein:  70-85 grams  Fluid:  1.5 - 1.7 L/day  Skin:   (Laceration on head)  Diet Order:  Diet Heart Room service appropriate?: Yes; Fluid consistency:: Thin  EDUCATION NEEDS:  No education needs identified at this time   Intake/Output Summary (Last 24 hours) at 06/24/14 1350 Last data filed at 06/24/14 1300  Gross per 24 hour  Intake    600 ml  Output      0 ml  Net    600 ml    Last BM:  5/25  Marijean NiemannStephanie La, MS, RD, LDN Pager # [phone] After hours/ weekend pager # [phone]

## 2014-06-24 NOTE — Progress Notes (Signed)
Occupational Therapy Session Note  Patient Details  Name: [name] MRN: [id number] Date of Birth: 05/02/1920  Today's Date: 06/24/2014 OT Individual Time: 1110-1150 OT Individual Time Calculation (min): 40 min    Short Term Goals: Week 1:  OT Short Term Goal 1 (Week 1): Pt will complete UB dressing with Mod A OT Short Term Goal 2 (Week 1): Pt will complete LB dressing with Mod A OT Short Term Goal 3 (Week 1): Pt will complete toileting routine with Mod A OT Short Term Goal 4 (Week 1): Pt will complete toilet transfer with Mod A  OT Short Term Goal 5 (Week 1): Pt will complete functional activity in standing for 1 min with Min A  Skilled Therapeutic Interventions/Progress Updates:    Engaged in therapeutic activity with focus on activity tolerance, transfers, visual attention to Lt and LUE, and functional use of LUE.  In ADL kitchen engaged in visual scanning to locate items in cabinet as needed for meal planning and making grocery list.  Engaged in standing for 2 bouts of 1-2 minutes with pt able to scan to Lt with min cues.  Utilized LUE to open and close cabinet and when attempting to reach for item in refrigerator.  Pt with spontaneous use of LUE but required hand over hand assist to actually obtain item on Lt with LUE. Returned to room and completed toilet transfer with mod assist and toileting with attempts to use LUE to assist with clothing management but unable to at this time.  Pt left seated in w/c with all needs in reach.  Therapy Documentation Precautions:  Precautions Precautions: Fall Restrictions Weight Bearing Restrictions: No General: General PT Missed Treatment Reason: Xray Vital Signs: Therapy Vitals Pulse Rate: 71 BP: (!) 125/109 mmHg Patient Position (if appropriate): Sitting Oxygen Therapy SpO2: (!) 79 % O2 Device: Nasal Cannula O2 Flow Rate (L/min): 2 L/min Pain: Pain Assessment Pain Assessment: No/denies pain  See FIM for current functional  status  Therapy/Group: Individual Therapy  Rosalio LoudHOXIE, Jt Brabec 06/24/2014, 11:54 AM

## 2014-06-24 NOTE — Progress Notes (Signed)
Physical Therapy Session Note  Patient Details  Name: [name] W Labrum MRN: [id number] Date of Birth: 02/27/1920  Today's Date: 06/24/2014 PT Individual Time: 0900-0940 PT Individual Time Calculation (min): 40 min   Short Term Goals: Week 1:  PT Short Term Goal 1 (Week 1): Patient will perform bed mobility with HOB flat with consistent min A.  PT Short Term Goal 2 (Week 1): Patient will transfer bed <>wheelchair with consistent min A.  PT Short Term Goal 3 (Week 1): Patient will ambulate 150 ft using LRAD with min A. PT Short Term Goal 4 (Week 1): Patient will maintain standing balance x 2 min during functional task with min A.  PT Short Term Goal 5 (Week 1): Patient will negotiate up/down 4 stairs using 2 rails with min A.   Skilled Therapeutic Interventions/Progress Updates:   Session focused on sitting balance, postural control, midline orientation in standing, functional mobility training, and activity tolerance. Patient propelled wheelchair using R hemi technique x 150 ft with supervision-min A and verbal cues for sequencing and technique. Patient performed squat pivot transfers to R and L with mod A overall and stand pivot transfers with max cues for sequencing scooting forward on surface, head hips relationship, and hand placement. Patient performed static sitting balance edge of mat for active rest breaks with focus on upright posture and midline orientation with min tactile cuing for trunk extension and cues for pursed lip breathing with visual feedback of blowing piece of paper on exhalation. Patient performed sit <> stand transfers and static standing with max > mod A overall and total multimodal cues for B foot placement, shifting weight forward over BOS, L knee extension, hip extension to neutral, and upright posture. Patient placed objects to high target on R side to facilitate weight shift to R and upright posture with mod A overall. Patient reported fatigue and requested to lie down,  requiring mod-max A for bed mobility on mat table. Patient's RN present and reported patient to be transported for xray. Patient left supine in bed with transport. Patient missed 20 min skilled physical therapy due to xray, will f/u as able.   Therapy Documentation Precautions:  Precautions Precautions: Fall Restrictions Weight Bearing Restrictions: No General: PT Amount of Missed Time (min): 20 Minutes PT Missed Treatment Reason: Xray Pain: Pain Assessment Pain Assessment: No/denies pain  See FIM for current functional status  Therapy/Group: Individual Therapy  Kerney ElbeVarner, Sheritha Louis A 06/24/2014, 9:52 AM

## 2014-06-25 ENCOUNTER — Inpatient Hospital Stay (HOSPITAL_COMMUNITY): Payer: Medicare Other | Admitting: Speech Pathology

## 2014-06-25 DIAGNOSIS — S06890D Other specified intracranial injury without loss of consciousness, subsequent encounter: Secondary | ICD-10-CM

## 2014-06-25 NOTE — Progress Notes (Signed)
Speech Language Pathology Daily Session Note  Patient Details  Name: [name] W Hafford MRN: [id number] Date of Birth: 06/11/1920  Today's Date: 06/25/2014 SLP Individual Time: [id number] SLP Individual Time Calculation (min): 30 min  Short Term Goals: Week 1: SLP Short Term Goal 1 (Week 1): Patient will attend to left field of enviornment/body during functional and familiar tasks with Min A verbal and question cues. SLP Short Term Goal 2 (Week 1): Patient will demonstrate functional problem solving for functional and familiar tasks with Min A verbal and question cues.  SLP Short Term Goal 3 (Week 1): Patient will utilize call bell to request assistance with supervision verbal cues in 75% of observable opportunities  SLP Short Term Goal 4 (Week 1): Patient will self-monitor and correct errors during functional and familiar tasks with Min A verbal and question cues.  SLP Short Term Goal 5 (Week 1): Patient will recall new, daily information with use of visual aids with Min A verbal and question cues.   Skilled Therapeutic Interventions: Skilled treatment session focused on cognition goals.  SLP facilitated session by providing Min A- Supervision with answering basic math word problems related to functional tasks.  She required Min A with recalling numbers and the multi-steps to solve the answer when 3 steps were involved as well as steps to safely transfer from chair to bed.  Patient required Supervision following 1 and 2 step directions when transferring from chair to bed.  Continue plan of care.      FIM:  Comprehension Comprehension Mode: Auditory Comprehension: 5-Understands basic 90% of the time/requires cueing < 10% of the time Expression Expression Mode: Verbal Expression: 5-Expresses basic needs/ideas: With no assist Social Interaction Social Interaction: 6-Interacts appropriately with others with medication or extra time (anti-anxiety, antidepressant). Problem Solving Problem Solving:  4-Solves basic 75 - 89% of the time/requires cueing 10 - 24% of the time Memory Memory: 4-Recognizes or recalls 75 - 89% of the time/requires cueing 10 - 24% of the time  Pain Pain Assessment Pain Assessment: No/denies pain  Therapy/Group: Individual Therapy  Cranford MonElizabeth Leyana Whidden, MA CCC-SLP Cranford MonKonrad, Lakeesha Fontanilla A 06/25/2014, 7:56 PM

## 2014-06-25 NOTE — Progress Notes (Signed)
Patient ID: [name], female   DOB: 10/29/1920, 81 y.o.   MRN: [id number]   06/25/14.   [facility]     PROGRESS NOTE   81 y/o admit for CIR with  functional deficits secondary to TBI after fall   Subjective/Complaints:  Alert; Tired but no complaints.   Past Medical History  Diagnosis Date  . Atrial fibrillation with rapid ventricular response Feb 2010  . Hypertension   . Flash pulmonary edema Feb 2010    Due to atrial fib with RVR  . Aortic insufficiency Feb 2010    Mild to moderate per echo with normal EF  . Arthritis   . Hyperlipidemia   . Dementia   . Vitamin D deficiency   . IBS (irritable bowel syndrome)   . PUD (peptic ulcer disease)   . Compression fracture   . Raynaud phenomenon   . CAD (coronary artery disease)     2010.  60% RCA, 60 - 70% LAD  . Carotid stenosis     40 - 59% bilateral   Patient Vitals for the past 24 hrs:  BP Temp Temp src Pulse Resp SpO2  06/25/14 0539 (!) 144/58 mmHg 98.7 F (37.1 C) Oral 70 16 98 %  06/24/14 1500 (!) 125/54 mmHg 98.7 F (37.1 C) Oral 96 14 99 %     Intake/Output Summary (Last 24 hours) at 06/25/14 0919 Last data filed at 06/24/14 1900  Gross per 24 hour  Intake    480 ml  Output      0 ml  Net    480 ml      Objective: Vital Signs: Blood pressure 144/58, pulse 70, temperature 98.7 F (37.1 C), temperature source Oral, resp. rate 16, height  (1.676 m), weight 107 lb 9.4 oz (48.8 kg), SpO2 98 %.       Wt Readings from Last 3 Encounters:  06/22/14 107 lb 9.4 oz (48.8 kg)  06/16/14 111 lb 8.8 oz (50.6 kg)  06/07/14 115 lb (52.164 kg)    Physical Exam:  Constitutional: She is oriented to person, place, and time. She appears well-developed and well-nourished. No distress.  Thin elderly female. Fatigued appearing but NAD.  Skin- resolving occipital and R elbow contussion/ecchymoses HENT:  Head: Normocephalic.   Eyes: Conjunctivae and EOM are normal. Pupils  are equal, round, and reactive to light.  Neck: Normal range of motion. Neck supple. No JVD present. No tracheal deviation present. No thyromegaly present.  Cardiovascular: Normal rate. An irregularly irregular rhythm present.  Systolic murmur heard. Respiratory: Effort normal and breath sounds normal. No respiratory distress. She has no wheezes.  GI: Soft. Bowel sounds are normal. She exhibits no distension. There is no tenderness. There is no rebound.  Musculoskeletal: She exhibits no edema or tenderness.  Kyphosis noted. Diffuse ecchymosis right forearm with healing abraded area on elbow healing.  Neurological: She is alert and oriented to person, place, and time.     AMedical Problem List and Plan: 1. Functional deficits secondary to TBI after fall  2. DVT Prophylaxis/Anticoagulation: Mechanical: Sequential compression devices, below knee Bilateral lower extremities  3. Skin/Wound Care: Routine pressure relief measures. Scalp laceration healing well  4. HTN: Monitor BP every 8 hours. Has been labile and medications increased to bid. H/o orthostasis in the past--monitor for symptoms.  5. Dyslipidemia: On hold due to ICH. Resume at discharge. 6. ABLA:  iron supplement.  7. PAF with h/o flash edema due to RVR: On  amiodarone.  HR occasionally >100.    LOS (Days) 4 A FACE TO FACE EVALUATION WAS PERFORMED  Rogelia BogaKWIATKOWSKI,Kashari Chalmers FRANK 06/25/2014 9:17 AM

## 2014-06-26 ENCOUNTER — Inpatient Hospital Stay (HOSPITAL_COMMUNITY): Payer: Medicare Other | Admitting: *Deleted

## 2014-06-26 ENCOUNTER — Inpatient Hospital Stay (HOSPITAL_COMMUNITY): Payer: Medicare Other | Admitting: Physical Therapy

## 2014-06-26 ENCOUNTER — Inpatient Hospital Stay (HOSPITAL_COMMUNITY): Payer: Medicare Other | Admitting: Occupational Therapy

## 2014-06-26 NOTE — Progress Notes (Signed)
Physical Therapy Session Note  Patient Details  Name: [name] MRN: [id number] Date of Birth: 11/24/1920  Today's Date: 06/26/2014 PT Individual Time: 0800-0900 PT Individual Time Calculation (min): 60 min   Short Term Goals: Week 1:  PT Short Term Goal 1 (Week 1): Patient will perform bed mobility with HOB flat with consistent min A.  PT Short Term Goal 2 (Week 1): Patient will transfer bed <>wheelchair with consistent min A.  PT Short Term Goal 3 (Week 1): Patient will ambulate 150 ft using LRAD with min A. PT Short Term Goal 4 (Week 1): Patient will maintain standing balance x 2 min during functional task with min A.  PT Short Term Goal 5 (Week 1): Patient will negotiate up/down 4 stairs using 2 rails with min A.   Skilled Therapeutic Interventions/Progress Updates:   Pt with limited anterior and R sided weight shift and L lateral L/S flexor weakness limiting mobility. Pt with improved performance in transfers when cued for  Proper weight shift, facilitated, or reaching anterior and lateral with LUE. Pt would continue to benefit from skilled individualized PT services to increase functional mobility.    Therapy Documentation Precautions:  Precautions Precautions: Fall Precaution Comments: L listing Restrictions Weight Bearing Restrictions: No Vital Signs: Therapy Vitals Temp: 98.2 F (36.8 C) Temp Source: Oral Pulse Rate: 70 Resp: 16 BP: (!) 141/65 mmHg Patient Position (if appropriate): Lying Oxygen Therapy SpO2: 99 % O2 Device: Not Delivered Pain: Pain Assessment Pain Assessment: No/denies pain Mobility:  Pt performs transfers with Mod A with cues for weight shift and technique Locomotion : Ambulation Ambulation/Gait Assistance: 1: Max A 4' with cues for weight shift and posture Other Treatments:  Unsupported sitting as active rest. Weight shifts Ankle pumps x20. LAQs and marching x10. Pt performs RUE reaching superior and laterally 2x10 in sitting and standing.  L/S ext and lateral L/S flex, T/S ext soft tissue stretches. Transfers x 5 reaching laterally and anteriorly with L hand. Pt performs static standing 1'x3. Pre-gait BLE advancement 2x10.Pt educated on rehab plan and safety in mobility.  See FIM for current functional status  Therapy/Group: Individual Therapy  Christia ReadingKinney, Annalyn Blecher G 06/26/2014, 8:12 AM

## 2014-06-26 NOTE — Progress Notes (Signed)
Physical Therapy Session Note  Patient Details  Name: Mervyn Skeetersula W Shetterly MRN: [id number] Date of Birth: 06/12/1920  Today's Date: 06/26/2014 PT Individual Time: [id number] PT Individual Time Calculation (min): 30 min    Skilled Therapeutic Interventions/Progress Updates:  Patient in room ,agrees to therapy intervention, very pleasant and excited to participate in therapy. Patient propelled w/c to gym with mod A from therapist. Training and assistance in gait training with HHA x1x 7 feet with max A,manual facilitation of L LE and locking of Knee,max cues for proper posture and manual corrections to prevent L side lean. Attempted gait training with RW x1 x12 feet with max VC and max A,manual assistance for L Knee extension during weight bearing. Patient sat in w/c to rest and become less interactive BP taken with result of 111/30 , returned to room and transferred to bed via squat pivot max A. RN notified and arrived in room , slight increase in BP in supine to 119/45,patient stated she just wants to rest. Left in room in bed with alarm engaged. Therapy Documentation Precautions:  Precautions Precautions: Fall Precaution Comments: L listing Restrictions Weight Bearing Restrictions: No Vital Signs: Therapy Vitals Temp: 98.6 F (37 C) Temp Source: Oral Pulse Rate: 66 Resp: 16 BP: (!) 134/47 mmHg Patient Position (if appropriate): Lying Oxygen Therapy SpO2: 100 % O2 Device: Nasal Cannula O2 Flow Rate (L/min): 2 L/min   See FIM for current functional status  Therapy/Group: Individual Therapy  Dorna MaiCzajkowska, Jenisis Harmsen W 06/26/2014, 3:52 PM

## 2014-06-26 NOTE — Progress Notes (Signed)
Occupational Therapy Session Note  Patient Details  Name: [name] MRN: [id number] Date of Birth: 06/14/1920  Today's Date: 06/26/2014  [phone]  (60 min)  1st session OT Individual Time: 1545-1630   2nd  session OT Individual Time Calculation (min): 45 min    Short Term Goals: Week 1:  OT Short Term Goal 1 (Week 1): Pt will complete UB dressing with Mod A OT Short Term Goal 2 (Week 1): Pt will complete LB dressing with Mod A OT Short Term Goal 3 (Week 1): Pt will complete toileting routine with Mod A OT Short Term Goal 4 (Week 1): Pt will complete toilet transfer with Mod A  OT Short Term Goal 5 (Week 1): Pt will complete functional activity in standing for 1 min with Min A Week 2:     Skilled Therapeutic Interventions/Progress Updates:      1st session:Pt. Sitting in wc upon OT arrival.  02= 80%  and HR= 47. Applied 2 liters of oxygen.  .  Ambulated to bathroom with RW and max assist for weight shifting and advancing LLE.  Pt. Transferred to toilet with max assist with increased leaning to left. HR= 60, 02= 86%.  Pt. Sat on toilet with close supervision and manual facilitation for upright posture with her increasing lean to left.  Utilized LUE in ADL with grasp and release, and place and pass.  Pt left in room with oxygen on and all needs in reach.    2nd session:  Time:  See above Pain: none Individual session:  Addressed transfers, undressing and donning night clothes.  Pt. transferred from bed to wc> toilet >wc with mod assist.  She performed toilet ing with max assist.  She was continent of BM but had trouble cleaning self.  Pt. Very tired and transferred back to bed and left with all needs in reach.    Therapy Documentation Precautions:  Precautions Precautions: Fall Precaution Comments: L listing Restrictions Weight Bearing Restrictions: No    Vital Signs:  See above notes  Pain:  None  1st session     See FIM for current functional status  Therapy/Group: Individual  Therapy  Humberto Sealsdwards, Magdalina Whitehead J 06/26/2014, 6:31 PM

## 2014-06-26 NOTE — Progress Notes (Signed)
Patient ID: [name], female   DOB: 02/28/1920, 81 y.o.   MRN: [id number]  Patient ID: [name], female   DOB: 07/27/1920, 81 y.o.   MRN: [id number]   06/26/14.   [facility] PHYSICAL MEDICINE & REHABILITATION     PROGRESS NOTE   81 y/o admit for CIR with  functional deficits secondary to TBI after fall   Subjective/Complaints:  Alert; Feels well this am; up early and sitting in chair  Past Medical History  Diagnosis Date  . Atrial fibrillation with rapid ventricular response Feb 2010  . Hypertension   . Flash pulmonary edema Feb 2010    Due to atrial fib with RVR  . Aortic insufficiency Feb 2010    Mild to moderate per echo with normal EF  . Arthritis   . Hyperlipidemia   . Dementia   . Vitamin D deficiency   . IBS (irritable bowel syndrome)   . PUD (peptic ulcer disease)   . Compression fracture   . Raynaud phenomenon   . CAD (coronary artery disease)     2010.  60% RCA, 60 - 70% LAD  . Carotid stenosis     40 - 59% bilateral   Patient Vitals for the past 24 hrs:  BP Temp Temp src Pulse Resp SpO2  06/26/14 0459 (!) 141/65 mmHg 98.2 F (36.8 C) Oral 70 16 99 %  06/25/14 1500 (!) 178/50 mmHg 98.5 F (36.9 C) Oral (!) 58 14 100 %     Intake/Output Summary (Last 24 hours) at 06/26/14 0807 Last data filed at 06/25/14 1700  Gross per 24 hour  Intake    360 ml  Output      0 ml  Net    360 ml      Objective: Vital Signs: Blood pressure 141/65, pulse 70, temperature 98.2 F (36.8 C), temperature source Oral, resp. rate 16, height 5\' 6"  (1.676 m), weight 107 lb 9.4 oz (48.8 kg), SpO2 99 %.       Wt Readings from Last 3 Encounters:  06/22/14 107 lb 9.4 oz (48.8 kg)  06/16/14 111 lb 8.8 oz (50.6 kg)  06/07/14 115 lb (52.164 kg)    Physical Exam:  Constitutional: She is oriented to person, place, and time. She appears well-developed and well-nourished. No distress.  Thin elderly female. Alert and  NAD.  Skin- resolving occipital and R elbow  contussion/ecchymoses HENT:  Head: Normocephalic.   Eyes: Conjunctivae and EOM are normal. Pupils are equal, round, and reactive to light.  Neck: Normal range of motion. Neck supple. No JVD present. No tracheal deviation present. No thyromegaly present.  Cardiovascular: Normal rate. An irregularly irregular rhythm present.  Systolic murmur heard. Respiratory: Effort normal and breath sounds normal. No respiratory distress. She has no wheezes.  GI: Soft. Bowel sounds are normal. She exhibits no distension. There is no tenderness. There is no rebound.  Musculoskeletal: She exhibits no edema or tenderness.  Kyphosis noted. Diffuse ecchymosis right forearm with healing abraded area on elbow healing.  Neurological: She is alert and oriented to person, place, and time.     AMedical Problem List and Plan: 1. Functional deficits secondary to TBI after fall  2. DVT Prophylaxis/Anticoagulation: Mechanical: Sequential compression devices, below knee Bilateral lower extremities  3. Skin/Wound Care: Routine pressure relief measures. Scalp laceration healing well  4. HTN: Monitor BP every 8 hours. Has been labile and medications increased to bid. H/o orthostasis in the past--monitor for symptoms.  5. Dyslipidemia: On  hold due to ICH. Resume at discharge. 6. ABLA:  iron supplement.  7. PAF with h/o flash edema due to RVR: On amiodarone.  HR occasionally >100.    LOS (Days) 5 A FACE TO FACE EVALUATION WAS PERFORMED  Rogelia Boga 06/26/2014 8:07 AM

## 2014-06-26 NOTE — Progress Notes (Signed)
Social Work  Social Work Assessment and Plan  Patient Details  Name: [name] W Gazzola MRN: [id number] Date of Birth: 04/24/1920  Today's Date: 06/23/2014  Problem List:  Patient Active Problem List   Diagnosis Date Noted  . Mild TBI 06/21/2014  . Traumatic brain injury   . SDH (subdural hematoma) 06/16/2014  . Nontraumatic hemorrhage of cerebral hemisphere   . Hypertensive urgency   . CAD (coronary artery disease) of artery bypass graft 06/15/2012  . Carotid bruit present 06/15/2012  . Raynaud phenomenon 02/08/2011  . PAF (paroxysmal atrial fibrillation) 10/09/2010  . High risk medication use 10/09/2010  . Aortic valve disease 10/09/2010  . HYPERLIPIDEMIA 06/10/2007  . HYPERTENSION 06/10/2007  . ALLERGIC RHINITIS 06/10/2007  . EMPHYSEMA 06/10/2007  . DYSPNEA 06/10/2007  . COUGH 06/10/2007   Past Medical History:  Past Medical History  Diagnosis Date  . Atrial fibrillation with rapid ventricular response Feb 2010  . Hypertension   . Flash pulmonary edema Feb 2010    Due to atrial fib with RVR  . Aortic insufficiency Feb 2010    Mild to moderate per echo with normal EF  . Arthritis   . Hyperlipidemia   . Dementia   . Vitamin D deficiency   . IBS (irritable bowel syndrome)   . PUD (peptic ulcer disease)   . Compression fracture   . Raynaud phenomenon   . CAD (coronary artery disease)     2010.  60% RCA, 60 - 70% LAD  . Carotid stenosis     40 - 59% bilateral   Past Surgical History:  Past Surgical History  Procedure Laterality Date  . Cardiac catheterization  03/11/2008    EF 45%  . Appendectomy    . Transthoracic echocardiogram  03/06/2008    EF 55%   Social History:  reports that she has never smoked. She does not have any smokeless tobacco history on file. She reports that she does not drink alcohol or use illicit drugs.  Family / Support Systems Marital Status: Widow/Widower How Long?: 3 yrs Patient Roles: Parent Children: daughter, Terrall LaityJoyce Freeman  [facility]([place]) @ ([phone]) [phone] Other Supports: grandaughter, Legrand ComoChristian Tolentino [facility]([place]) @ ([phone]) [phone] Anticipated Caregiver: daughter, granddaughter and family Ability/Limitations of Caregiver: dtr works as well as other family work. They can provide 24/7 assist for a few weeks at home, but long term they may need to look into ALF Caregiver Availability: 24/7 Family Dynamics: Pt describes daughter and grandaughter as very supportive and daughter reports they are commited to trying to provide any assistance patient needs, however, there is a time limit to how much time she and pt's grandaughter take take away from work.  Social History Preferred language: English Religion: Methodist Cultural Background: NA Read: Yes Write: Yes Employment Status: Retired Date Retired/Disabled/Unemployed: None Fish farm managerLegal Hisotry/Current Legal Issues: None Guardian/Conservator: None - per MD, pt capable of making decisions on her own behalf   Abuse/Neglect Physical Abuse: Denies Verbal Abuse: Denies Sexual Abuse: Denies Exploitation of patient/patient's resources: Denies Self-Neglect: Denies  Emotional Status Pt's affect, behavior adn adjustment status: Elderly woman laying in bed and completes assessment without difficulty, however, she does keep her eyes closed for most ot the interview.  She denies any s/s of any significant emotional distress.  No depression symptoms, however, will monitor and plan to refer to neuropsychology to eval more throroughly.  Daughter also reports she has not noted any depressive symptoms, however, does feel that pt has "lost self confidence". Recent Psychosocial Issues: widowed within the  past 3 yrs. Pyschiatric History: None Substance Abuse History: None  Patient / Family Perceptions, Expectations & Goals Pt/Family understanding of illness & functional limitations: Pt able to report that she feel and hit head.  Aware that she suffered some hemorrhaging as well as a  "small stroke".  Good general understanding of her current functional deficits/ need for CIR.  Daughter with good understanding of her medical issues as well. Premorbid pt/family roles/activities: Pt was "very independent" per daughter.  "She insisted on going upstairs to her bedroom and using her claw-foot bathtub even though she has everything she needs on the first floor..."  Pt reports she did stop driving last year but confirms that she has tried to stay very independent.  She manages all her bills and medicines. Anticipated changes in roles/activities/participation: Team has set supervision goals and anticipate that family will need to assume caregiver roles - for how long is the big question... Pt/family expectations/goals: Daughter and patient hopeful that 24/7 will only be needed short term.  Community Resources [employer]: None Premorbid Home Care/DME Agencies: None Transportation available at discharge: yes Resource referrals recommended: Neuropsychology  Discharge Planning Living Arrangements: Alone Support Systems: Children, Other relatives, Friends/neighbors Type of Residence: Private residence Insurance Resources: Electrical engineer Resources: SSD Financial Screen Referred: No Living Expenses: Own Money Management: Patient Does the patient have any problems obtaining your medications?: No Home Management: patient Patient/Family Preliminary Plans: Pt and family report plans for pt to return to her own home.  Daughter and grandaughter anticipate being able to provide 24/7 for approx. 3-4 weeks. Barriers to Discharge: Family Support (family availability for 24/7 is time-limited) Social Work Anticipated Follow Up Needs: HH/OP Expected length of stay: 18-21 days  Clinical Impression Elderly, frail appearing woman laying in bed with eyes closed through most of assessment interview.  She is able to complete interview without difficulty and all info provided accurate.  She  admits frustration with her fall and current limitation as she has remain very independent until this fall.  Daughter corroborates all of pt's info and feels pt has "lost her self confidence now..."  Family able to provide 24/7 support initially but this is time limited to 3-4 weeks.  Will follow for d/c planning and support needs.  Egon Dittus 06/23/2014, 12:51 PM

## 2014-06-27 ENCOUNTER — Inpatient Hospital Stay (HOSPITAL_COMMUNITY): Payer: Medicare Other

## 2014-06-27 ENCOUNTER — Inpatient Hospital Stay (HOSPITAL_COMMUNITY): Payer: Medicare Other | Admitting: Speech Pathology

## 2014-06-27 ENCOUNTER — Inpatient Hospital Stay (HOSPITAL_COMMUNITY): Payer: Medicare Other | Admitting: Physical Therapy

## 2014-06-27 DIAGNOSIS — I7300 Raynaud's syndrome without gangrene: Secondary | ICD-10-CM

## 2014-06-27 NOTE — Progress Notes (Signed)
[facility] PHYSICAL MEDICINE & REHABILITATION     PROGRESS NOTE    Subjective/Complaints: Patient doesn't remember me from 5 day. Denies any current breathing issues. Pt denies nausea, vomiting, abdominal pain, diarrhea, chest pain, shortness of breath, palpitations, dizziness   Objective: Vital Signs: Blood pressure 139/50, pulse 68, temperature 98.7 F (37.1 C), temperature source Oral, resp. rate 17, height 5\' 6"  (1.676 m), weight 48.8 kg (107 lb 9.4 oz), SpO2 100 %. No results found. No results for input(s): WBC, HGB, HCT, PLT in the last 72 hours. No results for input(s): NA, K, CL, GLUCOSE, BUN, CREATININE, CALCIUM in the last 72 hours.  Invalid input(s): CO CBG (last 3)  No results for input(s): GLUCAP in the last 72 hours.  Wt Readings from Last 3 Encounters:  06/22/14 48.8 kg (107 lb 9.4 oz)  06/16/14 50.6 kg (111 lb 8.8 oz)  06/07/14 52.164 kg (115 lb)    Physical Exam:  Constitutional: She is oriented to person, place, and time. She appears well-developed and well-nourished. No distress.  Thin elderly female. Fatigued appearing but NAD.  HENT:  Head: Normocephalic.   Eyes: Conjunctivae and EOM are normal. Pupils are equal, round, and reactive to light.  Neck: Normal range of motion. Neck supple. No JVD present. No tracheal deviation present. No thyromegaly present.  Cardiovascular: Normal rate. An irregularly irregular rhythm present.  Systolic murmur heard. Respiratory: Effort normal and breath sounds normal. No respiratory distress. She has no wheezes.  GI: Soft. Bowel sounds are normal. She exhibits no distension. There is no tenderness. There is no rebound.  Musculoskeletal: She exhibits no edema or tenderness.  Kyphosis noted. Diffuse ecchymosis right forearm with healing abraded area on elbow healing.  Neurological: She is alert and oriented to person, place, and time.  Patient is oriented to person, place and date of birth. Remains very alert.  Good  attention LUE and LLE 4/5 deltoid,bicep, tricep, wrist, HF, KE and ankle. Trace sensory loss along left face and left leg and arm. No resting tremor or rigidity.  Skin: Skin is warm and dry. She is not diaphoretic.  Psychiatric: She has a normal mood and affect.    Assessment/Plan: 1. Functional deficits secondary to TBI after fall which require 3+ hours per day of interdisciplinary therapy in a comprehensive inpatient rehab setting. Physiatrist is providing close team supervision and 24 hour management of active medical problems listed below. Physiatrist and rehab team continue to assess barriers to discharge/monitor patient progress toward functional and medical goals.  FIM: FIM - Bathing Bathing Steps Patient Completed: Chest, Right Arm, Left Arm, Right upper leg, Left upper leg Bathing: 2: Max-Patient completes 3-4 [id number] 10 parts or 25-49%  FIM - Upper Body Dressing/Undressing Upper body dressing/undressing steps patient completed: Thread/unthread right sleeve of pullover shirt/dresss, Thread/unthread left sleeve of pullover shirt/dress Upper body dressing/undressing: 3: Mod-Patient completed 50-74% of tasks FIM - Lower Body Dressing/Undressing Lower body dressing/undressing steps patient completed: Thread/unthread right pants leg, Thread/unthread right underwear leg, Thread/unthread left underwear leg Lower body dressing/undressing: 2: Max-Patient completed 25-49% of tasks  FIM - Toileting Toileting steps completed by patient: Performs perineal hygiene Toileting Assistive Devices: Grab bar or rail for support Toileting: 2: Max-Patient completed 1 of 3 steps  FIM - Diplomatic Services operational officerToilet Transfers Toilet Transfers Assistive Devices: Grab bars, Art gallery managerWalker Toilet Transfers: 2-To toilet/BSC: Max A (lift and lower assist), 2-From toilet/BSC: Max A (lift and lower assist)  FIM - Press photographerBed/Chair Transfer Bed/Chair Transfer Assistive Devices: Arm rests Bed/Chair Transfer: 5: Supine >  Sit: Supervision (verbal  cues/safety issues), 3: Sit > Supine: Mod A (lifting assist/Pt. 50-74%/lift 2 legs), 2: Chair or W/C > Bed: Max A (lift and lower assist), 2: Bed > Chair or W/C: Max A (lift and lower assist)  FIM - Locomotion: Wheelchair Distance: 100 ft Locomotion: Wheelchair: 5: Travels 150 ft or more: maneuvers on rugs and over door sills with supervision, cueing or coaxing FIM - Locomotion: Ambulation Locomotion: Ambulation Assistive Devices: Occupational hygienist, Other (comment) (R rail in hallway) Ambulation/Gait Assistance: 2: Max assist, 3: Mod assist, 4: Min assist Locomotion: Ambulation: 1: Travels less than 50 ft with maximal assistance (Pt: 25 - 49%)  Comprehension Comprehension Mode: Auditory Comprehension: 5-Understands complex 90% of the time/Cues < 10% of the time  Expression Expression Mode: Verbal Expression: 5-Expresses basic needs/ideas: With no assist  Social Interaction Social Interaction: 6-Interacts appropriately with others with medication or extra time (anti-anxiety, antidepressant).  Problem Solving Problem Solving Mode: Not assessed Problem Solving: 5-Solves basic problems: With no assist  Memory Memory: 5-Recognizes or recalls 90% of the time/requires cueing < 10% of the time  Medical Problem List and Plan: 1. Functional deficits secondary to TBI after fall  2. DVT Prophylaxis/Anticoagulation: Mechanical: Sequential compression devices, below knee Bilateral lower extremities 3. Pain Management: Tylenol prn.  4. Mood: On Celexa daily. LCSW to follow for evaluation and support.  5. Neuropsych: This patient is capable of making decisions on her own behalf. 6. Skin/Wound Care: Routine pressure relief measures. Scalp laceration healing well--remove scalp staples early next week  7. Fluids/Electrolytes/Nutrition: Monitor I/O.  Encourage adequate po intake. Labs normal today  8. HTN: Monitor BP every 8 hours. Has been labile and medications increased to bid. H/o orthostasis  in the past--monitor for symptoms.  9. Dyslipidemia: On hold due to ICH. Resume at discharge. 10. ABLA:  iron supplement.  11. PAF with h/o flash edema due to RVR: On amiodarone.  HR occasionally >100.  12. CAD: Has been managed medically without symptoms.  13. IBS: diet/supps 14. Raynaud's phenomenon, this is causing her oxygen saturations to read falsely low, we'll need to keep room temperature a little higher than usual LOS (Days) 6 A FACE TO FACE EVALUATION WAS PERFORMED  Claudette Laws E 06/27/2014 10:17 AM

## 2014-06-27 NOTE — Progress Notes (Signed)
Occupational Therapy Session Note  Patient Details  Name: [name] MRN: [id number] Date of Birth: 01/29/1920  Today's Date: 06/27/2014 OT Individual Time: 1100-1200 and 1300-1325     Short Term Goals: Week 1:  OT Short Term Goal 1 (Week 1): Pt will complete UB dressing with Mod A OT Short Term Goal 2 (Week 1): Pt will complete LB dressing with Mod A OT Short Term Goal 3 (Week 1): Pt will complete toileting routine with Mod A OT Short Term Goal 4 (Week 1): Pt will complete toilet transfer with Mod A  OT Short Term Goal 5 (Week 1): Pt will complete functional activity in standing for 1 min with Min A  Skilled Therapeutic Interventions/Progress Updates:    Session 1: Pt seen for ADL retraining session focusing on functional transfers, functional mobility, standing balance, activity tolerance, attention to L side, and postural control. Pt received supine in bed asleep. Pt agreeable to shower/ADL session, but seemingly fatigued. Therapist checked BP/SpO2 stats. RN called to double check stats due to a-fib.  Pt completed supine>sit min A and sit>stand transfer to w/c with mod A. Pt engaged in bathing sit<>stand at sink requiring mod cues for awareness of body positioning due to posterior and left lateral lean. Pt required mod-max A sit<>stand 3x during session. Pt completed oral care at sink with therapist assisting for 25% of task to hold dentures. Overall pt demonstrated increased fatigue throughout session. Pt left sitting in w/c attached to 1L of O2 due to de-stating during therapy, with all needs in reach.   Session 2: Pt seen for 1:1 OT session with a focus on functional transfers, standing balance, midline orientation, and activity tolerance. Pt received sitting in w/c finishing lunch. Pt agreeable for OT therapy to begin and to finish lunch afterwards. Pt completed sit<>stand 4x with mod A overall to initiate stand, and min A in standing position. Pt able to remain in standing for apprx 30s.  Therapist provided manual facilitation at pelvis and chest to promote trunk extension and anterior pelvic tilt. Noted patient fatigue and lethargy during session. Pt requested to lie down in bed due to fatigue at end of session. Therapist notified RN of patient stats and fatigue level. Pt left supine in bed with all needs within reach.   Therapy Documentation Precautions:  Precautions Precautions: Fall Precaution Comments: L listing Restrictions Weight Bearing Restrictions: No General:   Vital Signs:  Pain: Pain Assessment Pain Assessment: No/denies pain  See FIM for current functional status  Therapy/Group: Individual Therapy  Alger Memosmily Avory Mimbs 06/27/2014, 12:07 PM

## 2014-06-27 NOTE — Progress Notes (Signed)
Physical Therapy Session Note  Patient Details  Name: [name] MRN: [id number] Date of Birth: 04/10/1920  Today's Date: 06/27/2014 PT Individual Time: 0800-0900 PT Individual Time Calculation (min): 60 min   Short Term Goals: Week 1:  PT Short Term Goal 1 (Week 1): Patient will perform bed mobility with HOB flat with consistent min Villegas.  PT Short Term Goal 2 (Week 1): Patient will transfer bed <>wheelchair with consistent min Villegas.  PT Short Term Goal 3 (Week 1): Patient will ambulate 150 ft using LRAD with min Villegas. PT Short Term Goal 4 (Week 1): Patient will maintain standing balance x 2 min during functional task with min Villegas.  PT Short Term Goal 5 (Week 1): Patient will negotiate up/down 4 stairs using 2 rails with min Villegas.   Skilled Therapeutic Interventions/Progress Updates:   Session focused on postural control, midline orientation, ambulation, functional transfers, L NMR, and overall activity tolerance. Patient received eating breakfast in wheelchair. Patient instructed in wheelchair propulsion using R hemi technique x 150 ft with supervision, mod cues for sequencing and attending to L. Gait training using R rail in hallway x 30 ft with min-mod Villegas, therapist assisting with placement of LLE and facilitating L knee extension in stance phase, max verbal/tactile cues for upright posture and forward gaze. Patient instructed in gait with use of WBQC x 15 ft, mod-max Villegas with cues as above and total verbal cues for sequencing cane. Patient reporting fatigue and requested supine rest break. Patient transferred sit > long sit > supine with min-mod Villegas and supine > long sit > sit with supervision and verbal cues for sequencing. Patient performed NuStep using BLE only at level 4 x 7 min for L NMR and activity tolerance. Patient performed squat pivot transfers throughout session with mod-max Villegas and verbal cues for technique and sequencing. Patient propelled wheelchair using BLE x 25 ft with supervision and cues for  sequencing LLE. Patient left sitting in wheelchair with quick release belt on and needs within reach, RN present.    Therapy Documentation Precautions:  Precautions Precautions: Fall Precaution Comments: L listing Restrictions Weight Bearing Restrictions: No Pain: Pain Assessment Pain Assessment: No/denies pain  See FIM for current functional status  Therapy/Group: Individual Therapy  Kerney ElbeVarner, [name] 06/27/2014, 8:54 AM

## 2014-06-27 NOTE — Progress Notes (Signed)
Speech Language Pathology Daily Session Note  Patient Details  Name: [name] MRN: [id number] Date of Birth: 05/21/1920  Today's Date: 06/27/2014 SLP Individual Time: 1400-1455 SLP Individual Time Calculation (min): 55 min  Short Term Goals: Week 1: SLP Short Term Goal 1 (Week 1): Patient will attend to left field of enviornment/body during functional and familiar tasks with Min A verbal and question cues. SLP Short Term Goal 2 (Week 1): Patient will demonstrate functional problem solving for functional and familiar tasks with Min A verbal and question cues.  SLP Short Term Goal 3 (Week 1): Patient will utilize call bell to request assistance with supervision verbal cues in 75% of observable opportunities  SLP Short Term Goal 4 (Week 1): Patient will self-monitor and correct errors during functional and familiar tasks with Min A verbal and question cues.  SLP Short Term Goal 5 (Week 1): Patient will recall new, daily information with use of visual aids with Min A verbal and question cues.   Skilled Therapeutic Interventions: Skilled treatment session focused on cognitive goals. Upon arrival, patient was lethargic while supine in bed. SLP facilitated session by providing Mod A verbal and tactile cues for attention and functional problem solving during self-feeding, especially in regards to her LUE.  Patient also participated in a mildly complex, new learning task and required extra time and Min A verbal and question cues for problem solving and recall with task. Patient attend to task for 30 minutes with supervision verbal cues. Patient was also able to recall events from previous therapy sessions with Min A question cues. Patient left semi-reclined in bed with alarm on and all needs within reach. Continue with current plan of care.    FIM:  Comprehension Comprehension Mode: Auditory Comprehension: 5-Understands complex 90% of the time/Cues < 10% of the time Expression Expression Mode:  Verbal Expression: 5-Expresses basic needs/ideas: With no assist Social Interaction Social Interaction: 6-Interacts appropriately with others with medication or extra time (anti-anxiety, antidepressant). Problem Solving Problem Solving: 5-Solves basic problems: With no assist Memory Memory: 5-Recognizes or recalls 90% of the time/requires cueing < 10% of the time FIM - Eating Eating Activity: 5: Supervision/cues  Pain Pain Assessment Pain Assessment: No/denies pain  Therapy/Group: Individual Therapy  Fortune Torosian 06/27/2014, 3:30 PM

## 2014-06-28 ENCOUNTER — Inpatient Hospital Stay (HOSPITAL_COMMUNITY): Payer: Medicare Other | Admitting: Occupational Therapy

## 2014-06-28 ENCOUNTER — Inpatient Hospital Stay (HOSPITAL_COMMUNITY): Payer: Medicare Other

## 2014-06-28 ENCOUNTER — Inpatient Hospital Stay (HOSPITAL_COMMUNITY): Payer: Medicare Other | Admitting: Physical Therapy

## 2014-06-28 ENCOUNTER — Inpatient Hospital Stay (HOSPITAL_COMMUNITY): Payer: Medicare Other | Admitting: Speech Pathology

## 2014-06-28 DIAGNOSIS — R0602 Shortness of breath: Secondary | ICD-10-CM

## 2014-06-28 LAB — BLOOD GAS, ARTERIAL
Acid-Base Excess: 2 mmol/L (ref 0.0–2.0)
BICARBONATE: 25.9 meq/L — AB (ref 20.0–24.0)
Drawn by: 129711
FIO2: 0.21 %
O2 Saturation: 95.5 %
PCO2 ART: 39.5 mmHg (ref 35.0–45.0)
Patient temperature: 98.6
TCO2: 27.1 mmol/L (ref 0–100)
pH, Arterial: 7.432 (ref 7.350–7.450)
pO2, Arterial: 82.4 mmHg (ref 80.0–100.0)

## 2014-06-28 MED ORDER — ALPRAZOLAM 0.25 MG PO TABS
0.2500 mg | ORAL_TABLET | Freq: Two times a day (BID) | ORAL | Status: DC
  Administered 2014-06-28 – 2014-06-30 (×4): 0.25 mg via ORAL
  Filled 2014-06-28 (×4): qty 1

## 2014-06-28 MED ORDER — ALPRAZOLAM 0.5 MG PO TABS: 0.5000 mg | ORAL_TABLET | Freq: Two times a day (BID) | ORAL | Status: DC | PRN

## 2014-06-28 NOTE — Progress Notes (Signed)
[facility]     PROGRESS NOTE    Subjective/Complaints: . Denies any current breathing issues.Patient does not recall diagnosis of Raynaud's Pt denies nausea, vomiting, abdominal pain, diarrhea, chest pain, shortness of breath, palpitations, dizziness   Objective: Vital Signs: Blood pressure 143/52, pulse 74, temperature 97.7 F (36.5 C), temperature source Oral, resp. rate 20, height  (1.676 m), weight 48.8 kg (107 lb 9.4 oz), SpO2 98 %. No results found. No results for input(s): WBC, HGB, HCT, PLT in the last 72 hours. No results for input(s): NA, K, CL, GLUCOSE, BUN, CREATININE, CALCIUM in the last 72 hours.  Invalid input(s): CO CBG (last 3)  No results for input(s): GLUCAP in the last 72 hours.  Wt Readings from Last 3 Encounters:  06/22/14 48.8 kg (107 lb 9.4 oz)  06/16/14 50.6 kg (111 lb 8.8 oz)  06/07/14 52.164 kg (115 lb)    Physical Exam:  Constitutional: She is oriented to person, place, and time. She appears well-developed and well-nourished. No distress.  Thin elderly female. Fatigued appearing but NAD.  HENT:  Head: Normocephalic.   Eyes: Conjunctivae and EOM are normal. Pupils are equal, round, and reactive to light.  Neck: Normal range of motion. Neck supple. No JVD present. No tracheal deviation present. No thyromegaly present.  Cardiovascular: Normal rate. An irregularly irregular rhythm present.  Systolic murmur heard. Respiratory: Effort normal and breath sounds normal. No respiratory distress. She has no wheezes.  GI: Soft. Bowel sounds are normal. She exhibits no distension. There is no tenderness. There is no rebound.  Musculoskeletal: She exhibits no edema or tenderness.  Kyphosis noted. Diffuse ecchymosis right forearm with healing abraded area on elbow healing.  Neurological: She is alert and oriented to person, place, and time.  Patient is oriented to person, place and date of birth. Remains very alert.   Good attention LUE and LLE 4/5 deltoid,bicep, tricep, wrist, HF, KE and ankle. Trace sensory loss along left face and left leg and arm. No resting tremor or rigidity.  Skin: Skin is warm and dry. She is not diaphoretic.  Psychiatric: She has a normal mood and affect.    Assessment/Plan: 1. Functional deficits secondary to TBI after fall which require 3+ hours per day of interdisciplinary therapy in a comprehensive inpatient rehab setting. Physiatrist is providing close team supervision and 24 hour management of active medical problems listed below. Physiatrist and rehab team continue to assess barriers to discharge/monitor patient progress toward functional and medical goals.  FIM: FIM - Bathing Bathing Steps Patient Completed: Chest, Abdomen, Front perineal area, Left upper leg, Right upper leg, Left Arm Bathing: 3: Mod-Patient completes 5-7 5f 10 parts or 50-74%  FIM - Upper Body Dressing/Undressing Upper body dressing/undressing steps patient completed: Thread/unthread left sleeve of pullover shirt/dress, Put head through opening of pull over shirt/dress, Thread/unthread right sleeve of pullover shirt/dresss Upper body dressing/undressing: 4: Min-Patient completed 75 plus % of tasks FIM - Lower Body Dressing/Undressing Lower body dressing/undressing steps patient completed: Thread/unthread right pants leg, Thread/unthread right underwear leg, Thread/unthread left underwear leg, Pull underwear up/down Lower body dressing/undressing: 3: Mod-Patient completed 50-74% of tasks  FIM - Toileting Toileting steps completed by patient: Performs perineal hygiene Toileting Assistive Devices: Grab bar or rail for support Toileting: 2: Max-Patient completed 1 of 3 steps  FIM - Diplomatic Services operational officer Devices: Grab bars Toilet Transfers: 2-To toilet/BSC: Max A (lift and lower assist), 2-From toilet/BSC: Max A (lift and lower assist)  FIM - Event organiserBed/Chair Transfer Bed/Chair  Transfer Assistive Devices: Bed rails Bed/Chair Transfer: 3: Supine > Sit: Mod A (lifting assist/Pt. 50-74%/lift 2 legs, 2: Bed > Chair or W/C: Max A (lift and lower assist)  FIM - Locomotion: Wheelchair Distance: 100 ft Locomotion: Wheelchair: 5: Travels 150 ft or more: maneuvers on rugs and over door sills with supervision, cueing or coaxing FIM - Locomotion: Ambulation Locomotion: Ambulation Assistive Devices: Occupational hygienistCane - Quad, Other (comment) (R rail in hallway) Ambulation/Gait Assistance: 2: Max assist, 3: Mod assist, 4: Min assist Locomotion: Ambulation: 1: Travels less than 50 ft with maximal assistance (Pt: 25 - 49%)  Comprehension Comprehension Mode: Auditory Comprehension: 5-Understands complex 90% of the time/Cues < 10% of the time  Expression Expression Mode: Verbal Expression: 5-Expresses basic needs/ideas: With no assist  Social Interaction Social Interaction: 6-Interacts appropriately with others with medication or extra time (anti-anxiety, antidepressant).  Problem Solving Problem Solving Mode: Not assessed Problem Solving: 5-Solves basic problems: With no assist  Memory Memory: 5-Recognizes or recalls 90% of the time/requires cueing < 10% of the time  Medical Problem List and Plan: 1. Functional deficits secondary to TBI after fall  2. DVT Prophylaxis/Anticoagulation: Mechanical: Sequential compression devices, below knee Bilateral lower extremities 3. Pain Management: Tylenol prn.  4. Mood: On Celexa daily. LCSW to follow for evaluation and support.  5. Neuropsych: This patient is capable of making decisions on her own behalf. 6. Skin/Wound Care: Routine pressure relief measures. Scalp laceration healing well--remove scalp staples early next week  7. Fluids/Electrolytes/Nutrition: Monitor I/O.  Encourage adequate po intake. Labs normal today  8. HTN: Monitor BP every 8 hours. Has been labile and medications increased to bid. H/o orthostasis in the  past--monitor for symptoms.  9. Dyslipidemia: On hold due to ICH. Resume at discharge. 10. ABLA:  iron supplement.  11. PAF with h/o flash edema due to RVR: On amiodarone.  HR occasionally >100.  12. CAD: Has been managed medically without symptoms.  13. IBS: diet/supps 14. Raynaud's phenomenon, this is causing her oxygen saturations to read falsely low, we'll need to keep room temperature a little higher than usual LOS (Days) 7 A FACE TO FACE EVALUATION WAS PERFORMED  Erick ColaceKIRSTEINS,Renee Erb E 06/28/2014 12:54 PM

## 2014-06-28 NOTE — Plan of Care (Signed)
Problem: RH Ambulation Goal: LTG Patient will ambulate in community environment (PT) LTG: Patient will ambulate in community environment, # of feet with assistance (PT).  Outcome: Not Applicable Date Met:  49/70/26 Goal discharged 5/31 due to slow patient progress.

## 2014-06-28 NOTE — Progress Notes (Signed)
Patient working on ST on cognitive tasks. ST reported to be feeling SOB with reports of tachypnea.    Exam: Patient in bed. States "feels bad" but unable to pinpoint why. Expressing concerns about not being able to remember and focus on task. No signs of distress noted. Fingers warm and pink. Pulse ox -98% Heart: irregular, HR 64. Lungs: clear without rales or wheezes.  Abd: Soft and non-tender.   A/P 1: SOB: Likely due to anxiety component. Will check ABG to rule out hypoxia as cause of intermittent SOB. COPD noted on CXR.

## 2014-06-28 NOTE — Progress Notes (Signed)
Occupational Therapy Session Note  Patient Details  Name: [name] MRN: [id number] Date of Birth: 1921/01/09  Today's Date: 06/28/2014 OT Individual Time: 1430-1500 OT Individual Time Calculation (min): 30 min    Short Term Goals: Week 1:  OT Short Term Goal 1 (Week 1): Pt will complete UB dressing with Mod A OT Short Term Goal 1 - Progress (Week 1): Met OT Short Term Goal 2 (Week 1): Pt will complete LB dressing with Mod A OT Short Term Goal 2 - Progress (Week 1): Not met OT Short Term Goal 3 (Week 1): Pt will complete toileting routine with Mod A OT Short Term Goal 3 - Progress (Week 1): Not met OT Short Term Goal 4 (Week 1): Pt will complete toilet transfer with Mod A  OT Short Term Goal 4 - Progress (Week 1): Partly met OT Short Term Goal 5 (Week 1): Pt will complete functional activity in standing for 1 min with Min A OT Short Term Goal 5 - Progress (Week 1): Partly met  Skilled Therapeutic Interventions/Progress Updates:    Pt seen for OT therapy focusing on ADL retraining, functional sitting balance, and functional transfers. Pt in supine upon arriva, agreeable to tx. Pt transferred supine> EOB with use of bed rails. Pt attempted to don B shoes seated EOB, however unable due to decreased dynamic sitting balancing as pt with L posterior lean in sitting. Pt required mod VCs to maintain upright sitting balance. Verbal and visual cues provided for help pt find and maintain midline. Pt transferred into w/c with mod A via squat pivot transfer. Pt able to don R shoe seated in w/c, and required min A to don L shoe. Pt taken to therapy gym and transferred onto therapy mat with mod A. Pt able to maintain static sitting balance on mat. Pt completed light ball toss activity seated unsupported on mat with VCs for anterior lean to maintain seated balance.   At end of session, pt self propelled w/c using B LEs to propel w/c, however, fatigued following ~43ft an requesting assist back to room. Pt  transferred back to bed with max A and assist to bring L LE onto bed. Pt left in supine at end of session, bed alarm on and NT present.    Pt appeared down throughout session, stating she felt "worthless and unable to do anything". Education and encouragement provided regarding progress of therapy and taking one day at a time.   Therapy Documentation Precautions:  Precautions Precautions: Fall Precaution Comments: L listing Restrictions Weight Bearing Restrictions: No Pain: Pain Assessment Pain Assessment: No/denies pain  See FIM for current functional status  Therapy/Group: Individual Therapy  Lewis, Baldemar Dady C 06/28/2014, 3:04 PM

## 2014-06-28 NOTE — Progress Notes (Signed)
Occupational Therapy Weekly Progress Note  Patient Details  Name: [name] MRN: [id number] Date of Birth: 1920/04/15  Beginning of progress report period: Jun 22, 2014 End of progress report period: Jun 28, 2014  Today's Date: 06/28/2014 OT Individual Time: 1000-1100 OT Individual Time Calculation (min): 60 min    Patient has met 1of 5 short term goals.  Pt currently requires mod-max A for functional transfers. Pt demonstrates poor postural control as she demonstrates a posterior and L lateral lean. Pt currently requires min-max A for bathing and dressing with multiple rest breaks due to fatigue. Pt utilizes LUE at diminished level with mod cues due to decreased attention and weakness. Patient's barriers to therapy at this time are decreased balance, decreased postural control, decreased awareness, and decreased activity tolerance.   Patient continues to demonstrate the following deficits: decreased postural control, decreased standing balance, decreased body awareness, decreased safety awareness, decreased coordination, decreased strength, decreased sequencing, decreased problem solving, decreased activity tolerance, poor spatial awareness, and L inattention. Pt will continue to benefit from skilled OT intervention to enhance her overall performance with BADL, improve postural control, balance, functional use of LUE, attention, awareness, and activity tolerance.    Patient not progressing toward long term goals.  See goal revision..  Plan of care revisions: Goals downgraded to Mod A overall due to slow progress..  OT Short Term Goals Week 1:  OT Short Term Goal 1 (Week 1): Pt will complete UB dressing with Mod A OT Short Term Goal 1 - Progress (Week 1): Met OT Short Term Goal 2 (Week 1): Pt will complete LB dressing with Mod A OT Short Term Goal 2 - Progress (Week 1): Not met OT Short Term Goal 3 (Week 1): Pt will complete toileting routine with Mod A OT Short Term Goal 3 - Progress  (Week 1): Not met OT Short Term Goal 4 (Week 1): Pt will complete toilet transfer with Mod A  OT Short Term Goal 4 - Progress (Week 1): Partly met OT Short Term Goal 5 (Week 1): Pt will complete functional activity in standing for 1 min with Min A OT Short Term Goal 5 - Progress (Week 1): Partly met Week 2:  OT Short Term Goal 1 (Week 2): Pt will perform UB dressing with Min A. OT Short Term Goal 2 (Week 2): Pt will perform LB dressing with Mod A.  OT Short Term Goal 3 (Week 2): Pt will consistently perform functional transfer with Mod A.  OT Short Term Goal 4 (Week 2): Pt will complete functional activity in standing for 90 seconds with Mod A.   Skilled Therapeutic Interventions/Progress Updates:    Session Note: Pt seen for ADL retraining session focusing on functional transfers, standing balance, activity tolerance, postural control, and attention to L side. Pt received supine in bed and agreeable to complete ADL routine. Pt completed supine>sit with mod A. Pt completed squat-pivot transfer from bed>w/c with mod A and max multimodal cues for sequencing new technique. Pt requesting toileting and completed squat-pivot transfer from w/c<>toilet with mod A. Pt demonstrated decreased attention and required mod sequencing cues. Pt engaged in bathing sit<>stand at sink level. Pt required mod cues for awareness of body position due to posterior and L lateral lean. Pt able to correct posture with therapist manual facilitation on chest and pelvis, and mod verbal cues. Pt initiates use of and able to utilize LUE at a diminished level during bathing task at sink. Pt stood apprx 5 times throughout session  with mod-max A. Pt then completed hair care and oral care at sink level with set-up assist. Pt then completed squat-pivot functional transfer from w/c to bed 2x for technique and sequencing practice. Pt demonstrated and stated feeling slight fatigue throughout session. Pt requesting to go back to bed after  session.  Pt left supine in bed with all needs within reach.   Therapy Documentation Precautions:  Precautions Precautions: Fall Precaution Comments: L listing Restrictions Weight Bearing Restrictions: No General:   Vital Signs:  Pain:   Pt does not report any pain.  Other Treatments:    See FIM for current functional status  Therapy/Group: Individual Therapy  Dorann Ou 06/28/2014, 12:02 PM

## 2014-06-28 NOTE — Progress Notes (Signed)
Speech Language Pathology Weekly Progress and Session Note  Patient Details  Name: [name] MRN: [id number] Date of Birth: 1920/07/31  Beginning of progress report period: Jun 21, 2014 End of progress report period: Jun 28, 2014  Today's Date: 06/28/2014 SLP Individual Time: 0753-0900 SLP Individual Time Calculation (min): 67 min  Short Term Goals: Week 1: SLP Short Term Goal 1 (Week 1): Patient will attend to left field of enviornment/body during functional and familiar tasks with Min A verbal and question cues. SLP Short Term Goal 1 - Progress (Week 1): Not met SLP Short Term Goal 2 (Week 1): Patient will demonstrate functional problem solving for functional and familiar tasks with Min A verbal and question cues.  SLP Short Term Goal 2 - Progress (Week 1): Met SLP Short Term Goal 3 (Week 1): Patient will utilize call bell to request assistance with supervision verbal cues in 75% of observable opportunities  SLP Short Term Goal 3 - Progress (Week 1): Met SLP Short Term Goal 4 (Week 1): Patient will self-monitor and correct errors during functional and familiar tasks with Min A verbal and question cues.  SLP Short Term Goal 4 - Progress (Week 1): Met SLP Short Term Goal 5 (Week 1): Patient will recall new, daily information with use of visual aids with Min A verbal and question cues.  SLP Short Term Goal 5 - Progress (Week 1): Met    New Short Term Goals: Week 2: SLP Short Term Goal 1 (Week 2): Patient will attend to left field of enviornment/body during functional and familiar tasks with Min A verbal and question cues. SLP Short Term Goal 2 (Week 2): Patient will recall new, daily information with use of visual aids with supervision verbal and question cues.  SLP Short Term Goal 3 (Week 2): Patient will self-monitor and correct errors during functional and familiar tasks with supervision verbal and question cues.  SLP Short Term Goal 4 (Week 2): Patient will demonstrate functional  problem solving for mildly complex tasks with supervision verbal and question cues.   Weekly Progress Updates: Patient has made functional gains and has met 4 of 5 STG's this reporting period due to increased recall of information, functional problem solving, emergent awareness of errors during functional tasks and use of call bell to request wants/needs. Currently, patient is demonstrating behaviors consistent with a Rancho Level VII and requires Min A multimodal cues for functional problem solving, use of external aids for recall and emergent awareness. However, patient continues to require Mod-Max A multimodal cues for attention to LUE during functional and familiar tasks. Patient and family education is ongoing and patient would benefit from continued skilled SLP intervention to maximize her cognitive function and overall functional independence prior to discharge.    Intensity: Minumum of 1-2 x/day, 30 to 90 minutes Frequency: 3 to 5 out of 7 days Duration/Length of Stay: 2 weeks Treatment/Interventions: Cognitive remediation/compensation;Cueing hierarchy;Functional tasks;Internal/external aids;Environmental controls;Patient/family education;Therapeutic Activities   Daily Session  Skilled Therapeutic Interventions: Skilled treatment session focused on cognitive goals. Upon arrival, patient was consuming her breakfast meal. Patient consumed meal of regular textures with thin liquids without overt s/s of aspiration but required Mod A verbal cues for attention to LUE during self-feeding. SLP also facilitated session by providing Mod A verbal and question cues for functional problem solving and organization during a mildly complex but familiar task of organizing a calendar with a variety of novel activities. Towards end of session, the patient's RR appeared to increase. The patient's  O2 was 98% with a HR of 70. The patient reported she did not feel short of breath but felt like she was working  "harder" to breathe. PA made aware and assessed patient. Patient left semi-reclined in bed with all needs within reach and alarm on. Continue with current plan of care.       FIM:  Comprehension Comprehension Mode: Auditory Comprehension: 5-Understands complex 90% of the time/Cues < 10% of the time Expression Expression Mode: Verbal Expression: 5-Expresses basic needs/ideas: With no assist Social Interaction Social Interaction: 6-Interacts appropriately with others with medication or extra time (anti-anxiety, antidepressant). Problem Solving Problem Solving: 5-Solves basic problems: With no assist Memory Memory: 5-Recognizes or recalls 90% of the time/requires cueing < 10% of the time FIM - Eating Eating Activity: 5: Set-up assist for open containers Pain Pain Assessment Pain Assessment: No/denies pain  Therapy/Group: Individual Therapy  Wyn Nettle 06/28/2014, 3:44 PM

## 2014-06-28 NOTE — Plan of Care (Signed)
Problem: RH Balance Goal: LTG: Patient will maintain dynamic sitting balance (OT) LTG: Patient will maintain dynamic sitting balance with assistance during activities of daily living (OT)  Downgraded due to slow progress in therapy.  Goal: LTG Patient will maintain dynamic standing with ADLs (OT) LTG: Patient will maintain dynamic standing balance with assist during activities of daily living (OT)  Downgraded due to slow progress in therapy.   Problem: RH Bathing Goal: LTG Patient will bathe with assist, cues/equipment (OT) LTG: Patient will bathe specified number of body parts with assist with/without cues using equipment (position) (OT)  Downgraded due to slow progress in therapy.   Problem: RH Dressing Goal: LTG Patient will perform lower body dressing w/assist (OT) LTG: Patient will perform lower body dressing with assist, with/without cues in positioning using equipment (OT)  Downgraded due to slow progress in therapy.   Problem: RH Toileting Goal: LTG Patient will perform toileting w/assist, cues/equip (OT) LTG: Patient will perform toiletiing (clothes management/hygiene) with assist, with/without cues using equipment (OT)  Downgraded due to slow progress in therapy.   Problem: RH Toilet Transfers Goal: LTG Patient will perform toilet transfers w/assist (OT) LTG: Patient will perform toilet transfers with assist, with/without cues using equipment (OT)  Downgraded due to slow progress in therapy.   Problem: RH Tub/Shower Transfers Goal: LTG Patient will perform tub/shower transfers w/assist (OT) LTG: Patient will perform tub/shower transfers with assist, with/without cues using equipment (OT)  Downgraded due to slow progress in therapy.

## 2014-06-28 NOTE — Progress Notes (Signed)
Physical Therapy Weekly Progress Note  Patient Details  Name: [name] MRN: [id number] Date of Birth: 23-Aug-1920  Beginning of progress report period: Jun 22, 2014 End of progress report period: Jun 28, 2014  Today's Date: 06/28/2014 PT Individual Time: 1300-1400 PT Individual Time Calculation (min): 60 min   Patient has met 0 of 5 short term goals. Patient currently demonstrating behaviors consistent with Rancho Level VII. Patiently requires S-min A for bed mobility, mod-max A for transfers, mod A stairs with 2 rails, mod-max A short distance gait with HHA and RW, and min-mod A standing balance. Patient is very motivated but fatigues quickly within therapy sessions/throughout day and requires multiple rest breaks. Patient remains limited by decreased postural control with forward lean or strong lateral lean and requires max multimodal cues for upright posture and L knee extension in stance.   Patient continues to demonstrate the following deficits: decreased postural control, LUE/LLE hemiplegia, muscle weakness, decreased endurance, impaired timing and sequencing, abnormal tone, unbalanced muscle activation, decreased coordination, decreased midline orientation, decreased attention to left, and decreased memory and therefore will continue to benefit from skilled PT intervention to enhance overall performance with activity tolerance, balance, postural control, ability to compensate for deficits, functional use of  left upper extremity and left lower extremity, attention, awareness and coordination.  Patient not progressing toward long term goals.  See goal revision. Plan of care revisions: goals downgraded to min-mod A overall.  PT Short Term Goals Week 1:  PT Short Term Goal 1 (Week 1): Patient will perform bed mobility with HOB flat with consistent min A.  PT Short Term Goal 1 - Progress (Week 1): Progressing toward goal PT Short Term Goal 2 (Week 1): Patient will transfer bed <>wheelchair  with consistent min A.  PT Short Term Goal 2 - Progress (Week 1): Not met PT Short Term Goal 3 (Week 1): Patient will ambulate 150 ft using LRAD with min A. PT Short Term Goal 3 - Progress (Week 1): Not met PT Short Term Goal 4 (Week 1): Patient will maintain standing balance x 2 min during functional task with min A.  PT Short Term Goal 4 - Progress (Week 1): Not met PT Short Term Goal 5 (Week 1): Patient will negotiate up/down 4 stairs using 2 rails with min A.  PT Short Term Goal 5 - Progress (Week 1): Not met Week 2:  PT Short Term Goal 1 (Week 2): Patient will perform bed mobility with HOB flat with consistent min A.  PT Short Term Goal 2 (Week 2): Patient will transfer bed <>wheelchair with consistent mod A.  PT Short Term Goal 3 (Week 2): Patient will ambulate 150 ft using LRAD with mod A. PT Short Term Goal 4 (Week 2): Patient will maintain standing balance x 2 min during functional task with mod A.  PT Short Term Goal 5 (Week 2): Patient will negotiate up/down 4 stairs using 2 rails with mod A.   Skilled Therapeutic Interventions/Progress Updates:   Session focused on midline orientation, upright posture, L NMR, standing balance, ambulation, stair training, transfers, and overall activity tolerance.   Patient received sitting in bed, finishing lunch meal. Patient transferred to edge of bed with HOB elevated and use of rail with supervision, performed squat pivot transfer bed <> wheelchair with max A. Patient requested to use bathroom, performed stand pivot transfer wheelchair > commode with use of grab bar and mod A overall, total cues for safe sequencing. Patient required assistance for completing clothing  management and hygiene with steady assist. Patient ambulated from commode to sink with HHA and mod A overall, max cues for step length and upright posture.   Stair training up/down 4 stairs using 2 rails with mod > max A and step by step multimodal cues for sequencing advancing BUE  along rails and LLE abduction due to adducted LLE with descent. Patient required total A to turn to sit in wheelchair at bottom of stairs due to decreased ability to follow commands for sequencing and dependence on BUE/reaching unsafely for rails.    Gait training with use of RW with L hand orthosis x 20 ft + 5 ft and overall mod A, patient demonstrating ability to advance/engage L knee extensors in stance phase without hands-on assist for first time this session, verbal/tactile cues for upright posture, therapist facilitating weight shift to R for improved L foot clearance with verbal cues for L heel strike to decrease sliding foot forward.   NuStep using BUE/BLE at level 4 x 5 min without rest at slow steady pace. Patient requested to return to bed at end of session, left semi reclined in bed with all needs within reach.   Therapy Documentation Precautions:  Precautions Precautions: Fall Precaution Comments: L listing Restrictions Weight Bearing Restrictions: No Pain: Pain Assessment Pain Assessment: No/denies pain  See FIM for current functional status  Therapy/Group: Individual Therapy  Laretta Alstrom 06/28/2014, 3:02 PM

## 2014-06-29 ENCOUNTER — Inpatient Hospital Stay (HOSPITAL_COMMUNITY): Payer: Medicare Other

## 2014-06-29 ENCOUNTER — Inpatient Hospital Stay (HOSPITAL_COMMUNITY): Payer: Medicare Other | Admitting: Occupational Therapy

## 2014-06-29 ENCOUNTER — Inpatient Hospital Stay (HOSPITAL_COMMUNITY): Payer: Medicare Other | Admitting: Physical Therapy

## 2014-06-29 ENCOUNTER — Inpatient Hospital Stay (HOSPITAL_COMMUNITY): Payer: Medicare Other | Admitting: Speech Pathology

## 2014-06-29 NOTE — Progress Notes (Signed)
Occupational Therapy Session Note  Patient Details  Name: [name] MRN: [id number] Date of Birth: 04/28/1920  Today's Date: 06/29/2014 OT Individual Time: 1300-1331 OT Individual Time Calculation (min): 31 min     Skilled Therapeutic Interventions/Progress Updates:    Pt's granddaughter-in-law present for first part of session.  Pt worked on supine to sit EOB with min assist to begin.  Once sitting she needed consistent min assist to maintain static sitting balance but progressed to supervision level after scooting to the EOB.  She worked on donning her shoes EOB with mod facilitation needed to maintain dynamic sitting balance while performing activity.  Max assist for tying shoes in sitting as well.  Pt with decreased FM coordination evident in the LUE.  Pt maintains posterior pelvic tilt in sitting but can achieve and maintain anterior pelvic tilt with mod facilitation.  While sitting unsupported EOB had pt work on initiation of lateral weightshift for lifting each LE off of the ground to cross over for dressing.  Pt able to maintain dynamic sitting balance while lifting each LE and alternating with min guard assist.  Had pt work on FM coordination task of opening her soap bottle using the left hand and then replacing the cap.  She required mod facilitation for replacing the cap but could remove it with increased time and supervision, while sitting unsupported.  Finished session with stand pivot transfer to the wheelchair with max assist.  Pt with increased pushing to the right side in standing as well as posteriorly secondary to decreased hip and trunk extension.    Therapy Documentation Precautions:  Precautions Precautions: Fall Precaution Comments: L listing Restrictions Weight Bearing Restrictions: No  Pain: Pain Assessment Pain Assessment: No/denies pain ADL: See FIM for current functional status  Therapy/Group: Individual Therapy  Jaston Havens OTR/L 06/29/2014, 2:38 PM

## 2014-06-29 NOTE — Progress Notes (Signed)
Occupational Therapy Note  Patient Details  Name: [name] MRN: [id number] Date of Birth: 07/28/1920  Today's Date: 06/29/2014 OT Individual Time: 1030-1100 OT Individual Time Calculation (min): 30 min   Pt denied pain Individual Therapy  Pt asleep in bed upon arrival but easily aroused.  Pt stated she was fatigued but agreeable to therapy if she could return to bed afterwards.  Pt performed supine->sitting EOB with supervision.  Pt sat EOB with steady A to read recently delivered mail.  Pt noted with slight lean to left and patient commented on same.  Pt engaged in sit<>stand from EOB with mod A and max multimodal cues to correct standing balance.  Pt noted with significant lean to left but able to shift weight to right when cued.  Pt performed side stepping towards HOB in preparation for stand->sit and subsequent sit->supine.  Pt performed sit->supine with min A.  Focus on activity tolerance, sit<>stand, sitting balance, standing balance, bed mobility, and safety awareness.   Lavone NeriLanier, Kiet Geer [facility] 06/29/2014, 11:58 AM

## 2014-06-29 NOTE — Progress Notes (Signed)
Physical Therapy Session Note  Patient Details  Name: Mervyn Skeetersula W Grafton MRN: [id number] Date of Birth: 09/01/1920  Today's Date: 06/29/2014 PT Individual Time: 0800-0900 PT Individual Time Calculation (min): 60 min   Short Term Goals: Week 2:  PT Short Term Goal 1 (Week 2): Patient will perform bed mobility with HOB flat with consistent min A.  PT Short Term Goal 2 (Week 2): Patient will transfer bed <>wheelchair with consistent mod A.  PT Short Term Goal 3 (Week 2): Patient will ambulate 150 ft using LRAD with mod A. PT Short Term Goal 4 (Week 2): Patient will maintain standing balance x 2 min during functional task with mod A.  PT Short Term Goal 5 (Week 2): Patient will negotiate up/down 4 stairs using 2 rails with mod A.   Skilled Therapeutic Interventions/Progress Updates:   Session focused on midline orientation, weight shift to R, postural control, functional ambulation, NMR, and activity tolerance. Patient sitting in recliner, performed sit <> stand with mod A and ambulated to wheelchair with L HHA and min-mod A overall, cues for upright posture and sequencing turns. Patient propelled wheelchair using BLE x 150 ft with supervision and verbal cues for clearing LLE with ankle DF and knee extension instead of sliding it along floor. Gait training using RW with L hand orthosis x 34 ft, min A faded to mod-max A with increased fatigue, therapist facilitating weight shift to R for improved LLE clearance and verbal cues for L heel strike. Patient required supine rest break following ambulation, min A for bed mobility on mat table. Supine therex for BLE strengthening: LAQ over bolster x 20 each LE with 3# wt on RLE and bridging 2 x 10. Patient c/o lightheadedness, see BP below. Patient transferred back to wheelchair via squat pivot with mod A and left sitting in wheelchair with all needs within reach to await OT session.     Therapy Documentation Precautions:  Precautions Precautions: Fall Precaution  Comments: L listing Restrictions Weight Bearing Restrictions: No Vital Signs: Therapy Vitals BP: (!) 105/43 mmHg Patient Position (if appropriate): Sitting Pain: Pain Assessment Pain Assessment: 0-10 Pain Score: 3  Pain Type: Chronic pain Pain Location: Chest Pain Orientation: Left Pain Radiating Towards: shoulder Pain Descriptors / Indicators:  ("like shoulder tired" & "feels like heart hurt" not new onse) Pain Frequency: Constant Pain Onset: On-going Pain Intervention(s): Medication (See eMAR)  See FIM for current functional status  Therapy/Group: Individual Therapy  Kerney ElbeVarner, Aeneas Longsworth A 06/29/2014, 8:52 AM

## 2014-06-29 NOTE — Progress Notes (Signed)
Occupational Therapy Session Note  Patient Details  Name: [name] MRN: [id number] Date of Birth: 09/29/1920  Today's Date: 06/29/2014 OT Individual Time: 0900-1000 OT Individual Time Calculation (min): 60 min    Short Term Goals: Week 1:  OT Short Term Goal 1 (Week 1): Pt will complete UB dressing with Mod A OT Short Term Goal 1 - Progress (Week 1): Met OT Short Term Goal 2 (Week 1): Pt will complete LB dressing with Mod A OT Short Term Goal 2 - Progress (Week 1): Not met OT Short Term Goal 3 (Week 1): Pt will complete toileting routine with Mod A OT Short Term Goal 3 - Progress (Week 1): Not met OT Short Term Goal 4 (Week 1): Pt will complete toilet transfer with Mod A  OT Short Term Goal 4 - Progress (Week 1): Partly met OT Short Term Goal 5 (Week 1): Pt will complete functional activity in standing for 1 min with Min A OT Short Term Goal 5 - Progress (Week 1): Partly met  Skilled Therapeutic Interventions/Progress Updates:    1:1 self care retraining at sink level. Focus on toilet transfers, sit to stands, standing balance with and without UE support, clothing Management in standing, use of left UE in functional tasks at gross assist level, [place], functional problem solving, selective attention to task, recall of hemi dressing techniques, body awareness and postural alignment. Initially pt did not present with strong pushing tendencies to the left just leaning towards left However towards end of session, with increase fatigue, pt with strong pusher tendencies towards left side requiring max to total A to maintain balance. Pt with able to self correct sitting balance 75% of time with mod questioning cuing during bathing and dressing tasks sitting EOC without left arm rest for support. Pt required max A to come into standing and level of assistance varied from min to total to maintain standing position. Pt able to tie one shoes for practice (with shoes in her lap) with more than reasonable  amt of time for [facility] of left hand. Pt requested to rest in bed at conclusion of session- transferred squat pivot with max A with extra time due to pushing.   Therapy Documentation Precautions:  Precautions Precautions: Fall Precaution Comments: L listing Restrictions Weight Bearing Restrictions: No Pain: No c/o pain in session  See FIM for current functional status  Therapy/Group: Individual Therapy  Willeen [facility] 06/29/2014, 10:52 AM

## 2014-06-29 NOTE — Progress Notes (Signed)
[facility] PHYSICAL MEDICINE & REHABILITATION     PROGRESS NOTE    Subjective/Complaints: Feels a little fatigued. Pain controlled. Slept well last night.  Pt denies nausea, vomiting, abdominal pain, diarrhea, chest pain, shortness of breath, palpitations, dizziness   Objective: Vital Signs: Blood pressure 154/60, pulse 67, temperature 98.2 F (36.8 C), temperature source Oral, resp. rate 17, height  (1.676 m), weight 46.1 kg (101 lb 10.1 oz), SpO2 98 %. No results found. No results for input(s): WBC, HGB, HCT, PLT in the last 72 hours. No results for input(s): NA, K, CL, GLUCOSE, BUN, CREATININE, CALCIUM in the last 72 hours.  Invalid input(s): CO CBG (last 3)  No results for input(s): GLUCAP in the last 72 hours.  Wt Readings from Last 3 Encounters:  06/29/14 46.1 kg (101 lb 10.1 oz)  06/16/14 50.6 kg (111 lb 8.8 oz)  06/07/14 52.164 kg (115 lb)    Physical Exam:  Constitutional: She is oriented to person, place, and time. She appears well-developed and well-nourished. No distress.  Thin elderly female. Fatigued appearing but NAD.  HENT:  Head: Normocephalic.   Eyes: Conjunctivae and EOM are normal. Pupils are equal, round, and reactive to light.  Neck: Normal range of motion. Neck supple. No JVD present. No tracheal deviation present. No thyromegaly present.  Cardiovascular: Normal rate. An irregularly irregular rhythm present.  Systolic murmur heard. Respiratory: Effort normal and breath sounds normal. No respiratory distress. She has no wheezes.  GI: Soft. Bowel sounds are normal. She exhibits no distension. There is no tenderness. There is no rebound.  Musculoskeletal: She exhibits no edema or tenderness.  Kyphosis noted. RUE bruising improved.  Ext: distal digits cool Neurological: She is alert and oriented to person, place, and time.  Patient is oriented to person, place and date of birth. Remains very alert.  Good attention LUE and LLE 4/5 deltoid,bicep,  tricep, wrist, HF, KE and ankle. Trace sensory loss along left face and left leg and arm. No resting tremor or rigidity.  Skin: Skin is warm and dry. She is not diaphoretic.  Psychiatric: She has a normal mood and affect.    Assessment/Plan: 1. Functional deficits secondary to TBI after fall which require 3+ hours per day of interdisciplinary therapy in a comprehensive inpatient rehab setting. Physiatrist is providing close team supervision and 24 hour management of active medical problems listed below. Physiatrist and rehab team continue to assess barriers to discharge/monitor patient progress toward functional and medical goals.  FIM: FIM - Bathing Bathing Steps Patient Completed: Chest, Right upper leg, Left upper leg, Buttocks, Left Arm, Front perineal area, Abdomen Bathing: 3: Mod-Patient completes 5-7 17f 10 parts or 50-74%  FIM - Upper Body Dressing/Undressing Upper body dressing/undressing steps patient completed: Thread/unthread left sleeve of pullover shirt/dress Upper body dressing/undressing: 2: Max-Patient completed 25-49% of tasks FIM - Lower Body Dressing/Undressing Lower body dressing/undressing steps patient completed: Thread/unthread right underwear leg, Thread/unthread left underwear leg, Thread/unthread right pants leg, Thread/unthread left pants leg, Don/Doff right sock, Don/Doff left sock Lower body dressing/undressing: 3: Mod-Patient completed 50-74% of tasks  FIM - Toileting Toileting steps completed by patient: Performs perineal hygiene Toileting Assistive Devices: Grab bar or rail for support Toileting: 2: Max-Patient completed 1 of 3 steps  FIM - Diplomatic Services operational officer Devices: Grab bars Toilet Transfers: 2-From toilet/BSC: Max A (lift and lower assist), 3-From toilet/BSC: Mod A (lift or lower assist)  FIM - Press photographer Assistive Devices: Bed rails Bed/Chair Transfer: 5:  Supine > Sit: Supervision (verbal  cues/safety issues), 3: Bed > Chair or W/C: Mod A (lift or lower assist), 2: Chair or W/C > Bed: Max A (lift and lower assist)  FIM - Locomotion: Wheelchair Distance: 100 ft Locomotion: Wheelchair: 1: Total Assistance/staff pushes wheelchair (Pt<25%) FIM - Locomotion: Ambulation Locomotion: Ambulation Assistive Devices: Other (comment), Orthosis, Walker - Rolling (L hand orthosis with RW, HHA) Ambulation/Gait Assistance: 3: Mod assist Locomotion: Ambulation: 1: Travels less than 50 ft with moderate assistance (Pt: 50 - 74%)  Comprehension Comprehension Mode: Auditory Comprehension: 5-Understands complex 90% of the time/Cues < 10% of the time  Expression Expression Mode: Verbal Expression: 5-Expresses basic needs/ideas: With no assist  Social Interaction Social Interaction: 6-Interacts appropriately with others with medication or extra time (anti-anxiety, antidepressant).  Problem Solving Problem Solving Mode: Not assessed Problem Solving: 5-Solves basic problems: With no assist  Memory Memory: 5-Recognizes or recalls 90% of the time/requires cueing < 10% of the time  Medical Problem List and Plan: 1. Functional deficits secondary to TBI after fall  2. DVT Prophylaxis/Anticoagulation: Mechanical: Sequential compression devices, below knee Bilateral lower extremities 3. Pain Management: Tylenol prn.  4. Mood: On Celexa daily. LCSW to follow for evaluation and support.  5. Neuropsych: This patient is capable of making decisions on her own behalf. 6. Skin/Wound Care: Routine pressure relief measures. Scalp laceration healing well--remove scalp staples early next week  7. Fluids/Electrolytes/Nutrition: Monitor I/O.  Encourage adequate po intake. Labs normal today  8. HTN: Monitor BP every 8 hours. Has been labile and medications increased to bid. H/o orthostasis in the past--monitor for symptoms.  9. Dyslipidemia: On hold due to ICH. Resume at discharge. 10. ABLA:  iron  supplement.  11. PAF with h/o flash edema due to RVR: On amiodarone.  HR better controlled.  12. CAD: Has been managed medically without symptoms.  13. IBS: diet/supplements 14. Raynaud's phenomenon--keep room warmer.  LOS (Days) 8 A FACE TO FACE EVALUATION WAS PERFORMED  Caylen Kuwahara T 06/29/2014 7:33 AM

## 2014-06-29 NOTE — Progress Notes (Signed)
Speech Language Pathology Daily Session Note  Patient Details  Name: [name] MRN: [id number] Date of Birth: 07/27/1920  Today's Date: 06/29/2014 SLP Individual Time: 1500-1530 SLP Individual Time Calculation (min): 30 min  Short Term Goals: Week 2: SLP Short Term Goal 1 (Week 2): Patient will attend to left field of enviornment/body during functional and familiar tasks with Min A verbal and question cues. SLP Short Term Goal 2 (Week 2): Patient will recall new, daily information with use of visual aids with supervision verbal and question cues.  SLP Short Term Goal 3 (Week 2): Patient will self-monitor and correct errors during functional and familiar tasks with supervision verbal and question cues.  SLP Short Term Goal 4 (Week 2): Patient will demonstrate functional problem solving for mildly complex tasks with supervision verbal and question cues.   Skilled Therapeutic Interventions: Skilled treatment session focused on cognitive goals. Upon arrival, patient was asleep while supine in bed but easily awakened to touch. Patient appeared lethargic and requested a cold cloth because she "felt out of it." However, patient recalled events from yesterday and morning therapy sessions with Mod I. Patient also participated in a mildly complex reasoning and problem solving task with extra time and supervision verbal cues. Patient left reclined in bed with alarm on and all needs within reach. Continue with current plan of care.    FIM:  Comprehension Comprehension Mode: Auditory Comprehension: 5-Understands complex 90% of the time/Cues < 10% of the time Expression Expression Mode: Verbal Expression: 5-Expresses basic needs/ideas: With no assist Social Interaction Social Interaction: 6-Interacts appropriately with others with medication or extra time (anti-anxiety, antidepressant). Problem Solving Problem Solving: 5-Solves basic problems: With no assist Memory Memory: 5-Recognizes or recalls 90%  of the time/requires cueing < 10% of the time  Pain Pain Assessment Pain Assessment: No/denies pain  Therapy/Group: Individual Therapy  [name] 06/29/2014, 3:40 PM

## 2014-06-30 ENCOUNTER — Inpatient Hospital Stay (HOSPITAL_COMMUNITY): Payer: Medicare Other | Admitting: Physical Therapy

## 2014-06-30 ENCOUNTER — Inpatient Hospital Stay (HOSPITAL_COMMUNITY): Payer: Medicare Other | Admitting: *Deleted

## 2014-06-30 ENCOUNTER — Inpatient Hospital Stay (HOSPITAL_COMMUNITY): Payer: Medicare Other

## 2014-06-30 ENCOUNTER — Encounter (HOSPITAL_COMMUNITY): Payer: Self-pay | Admitting: *Deleted

## 2014-06-30 MED ORDER — ENSURE ENLIVE PO LIQD
237.0000 mL | Freq: Two times a day (BID) | ORAL | Status: DC
  Administered 2014-06-30 – 2014-07-06 (×11): 237 mL via ORAL

## 2014-06-30 NOTE — Progress Notes (Signed)
Occupational Therapy Session Note  Patient Details  Name: [name] MRN: [id number] Date of Birth: [birth year]  Today's Date: 06/30/2014 OT Individual Time: 1530-1650 OT Individual Time Calculation (min): 80 min    Short Term Goals: Week 1:  OT Short Term Goal 1 (Week 1): Pt will complete UB dressing with Mod A OT Short Term Goal 1 - Progress (Week 1): Met OT Short Term Goal 2 (Week 1): Pt will complete LB dressing with Mod A OT Short Term Goal 2 - Progress (Week 1): Not met OT Short Term Goal 3 (Week 1): Pt will complete toileting routine with Mod A OT Short Term Goal 3 - Progress (Week 1): Not met OT Short Term Goal 4 (Week 1): Pt will complete toilet transfer with Mod A  OT Short Term Goal 4 - Progress (Week 1): Partly met OT Short Term Goal 5 (Week 1): Pt will complete functional activity in standing for 1 min with Min A OT Short Term Goal 5 - Progress (Week 1): Partly met  Skilled Therapeutic Interventions/Progress Updates:    Pt seen for ADL retraining session consisting of functional transfers, standing balance, postural control, attn to L side, activity tolerance, and safety awareness. Pt received supine in bed asleep. Pt agreeable to ADL session but stated she was "tired" and felt "weak." Pt supine>sit with mod A. Pt transferred to w/c via squat-pivot with max A. Pt transferred w/c<>toilet with mod A and max cues for sequencing transfer. Pt completed sit<>stand at toilet with mod A. Pt then completed B/D at sink level with max multimodal cues for arousal throughout task. Noted increased lean to L side, therapist facilitated midline position with pillow to facilitate weight shift to R. Pt noted with increased posterior lean in standing and pushing tendencies to L. Therapist facilitated trunk extension and anterior pelvic tilt through manual facilitation. Pt with L lean and therapist facilitated visual cue on R to bump hip against therapist to midline. Pt required mod-max A sit<>stand 5x  throughout session. Pt completed oral care sitting at sink with therapist assisting with 50% of task due to difficulty holding dentures with LUE. Pt then transferred w/c>bed with mod A. Noted increased lethargy and decreased engagement throughout session. Pt left supine in bed with all needs within reach.   Therapy Documentation Precautions:  Precautions Precautions: Fall Precaution Comments: L listing Restrictions Weight Bearing Restrictions: No General:   Vital Signs:  Pain:   ADL:   Exercises:   Other Treatments:    See FIM for current functional status  Therapy/Group: Individual Therapy  Dorann Ou 06/30/2014, 2:29 PM

## 2014-06-30 NOTE — Progress Notes (Signed)
Physical Therapy Session Note  Patient Details  Name: [name] W Matney MRN: [id number] Date of Birth: 02/03/1920  Today's Date: 06/30/2014 PT Individual Time: 0805-0905 PT Individual Time Calculation (min): 60 min   Short Term Goals: Week 2:  PT Short Term Goal 1 (Week 2): Patient will perform bed mobility with HOB flat with consistent min A.  PT Short Term Goal 2 (Week 2): Patient will transfer bed <>wheelchair with consistent mod A.  PT Short Term Goal 3 (Week 2): Patient will ambulate 150 ft using LRAD with mod A. PT Short Term Goal 4 (Week 2): Patient will maintain standing balance x 2 min during functional task with mod A.  PT Short Term Goal 5 (Week 2): Patient will negotiate up/down 4 stairs using 2 rails with mod A.   Skilled Therapeutic Interventions/Progress Updates:   Session focused on postural control, midline orientation, and weight shifting to facilitate improved LLE clearance with functional mobility training. Patient semi reclined in bed eating breakfast. Transferred supine > sit with HOB elevated with supervision and min A to scoot forward to edge of bed. Patient finished breakfast sitting edge of bed with supervision. Patient ambulated to/from bathroom using RW with L hand orthosis with max A, total multimodal cues for L knee extension in stance, weight shift to R, heel strike, upright midline orientation, and advancing RW. Patient washed hands at wheelchair level with mod assist to incorporate LUE. Patient propelled wheelchair using BLE x 150 ft with focus on L heel strike and ankle DF instead of sliding L foot forward flat on floor. Performed squat pivot transfers wheelchair <> mat table and wheelchair > bed with max A and cues for technique. Instructed patient in standing to facilitate weight shift to R and upright posture with reaching objects to tall target to R, max multimodal A cues for trunk/hip/knee extension to neutral and foot placement due to RLE abduction. Patient requested  to return to bed at end of session. Patient left semi reclined in bed with all needs within reach, bed alarm on.   Therapy Documentation Precautions:  Precautions Precautions: Fall Precaution Comments: L listing Restrictions Weight Bearing Restrictions: No Pain: Pain Assessment Pain Assessment: No/denies pain  See FIM for current functional status  Therapy/Group: Individual Therapy  Kerney ElbeVarner, Amyre Segundo A 06/30/2014, 9:11 AM

## 2014-06-30 NOTE — Progress Notes (Signed)
Recreational Therapy Session Note  Patient Details  Name: [name] W Haffey MRN: [id number] Date of Birth: 11/15/1920 Today's Date: 06/30/2014  Pain: no c/o Skilled Therapeutic Interventions/Progress Updates: Session focused on activity tolerance, squat pivot transfer, attention, visual scanning & problem solving during co-treat with SLP.  Pt required mod assist for bed mobility due to fatigue and min assist for sitting EOB.  Pt performed squat pivot transfer from bed-> w/c with max assist and cues for technique.  Once in the dayroom, pt performed modified jigsaw puzzle activity with supervision-min assist for LUE use while supported and mod cues for problem solving.  Therapy/Group: Co-Treatment Vicci Reder 06/30/2014, 4:14 PM

## 2014-06-30 NOTE — Progress Notes (Signed)
Patient c/o left chest pain, tylenol offered, patient refused. Later patient was asked about said pain, she stated, "It feels better, I don't have no pain".  Continued to monitor during shift.

## 2014-06-30 NOTE — Patient Care Conference (Signed)
Inpatient RehabilitationTeam Conference and Plan of Care Update Date: 06/28/2014   Time: 3:15 PM    Patient Name: Mervyn Skeetersula W [place]ltree      Medical Record Number: [id number]  Date of Birth: 10/26/1920 Sex: Female         Room/Bed: 4W09C/4W09C-01 Payor Info: Payor: Advertising copywriter[employer]ALTHCARE MEDICARE / [facility] / Product Type: *No Product type* /    Admitting Diagnosis: Traumatic SDH with ICH  Admit Date/Time:  06/21/2014  3:08 PM Admission Comments: No comment available   Primary Diagnosis:  Mild TBI Principal Problem: Mild TBI  Patient Active Problem List   Diagnosis Date Noted  . Mild TBI 06/21/2014  . Traumatic brain injury   . SDH (subdural hematoma) 06/16/2014  . Nontraumatic hemorrhage of cerebral hemisphere   . Hypertensive urgency   . CAD (coronary artery disease) of artery bypass graft 06/15/2012  . Carotid bruit present 06/15/2012  . Raynaud phenomenon 02/08/2011  . PAF (paroxysmal atrial fibrillation) 10/09/2010  . High risk medication use 10/09/2010  . Aortic valve disease 10/09/2010  . HYPERLIPIDEMIA 06/10/2007  . HYPERTENSION 06/10/2007  . ALLERGIC RHINITIS 06/10/2007  . EMPHYSEMA 06/10/2007  . DYSPNEA 06/10/2007  . COUGH 06/10/2007    Expected Discharge Date: Expected Discharge Date:  (2 wks vs SNF)  Team Members Present: Physician leading conference: Dr. Claudette LawsAndrew Kirsteins Social Worker Present: Amada JupiterLucy Wendell Fiebig, LCSW Nurse Present: Carmie EndAngie Joyce, RN PT Present: Bayard Huggerebecca Varner, PT OT Present: Ardis Rowanom Lanier, Darolyn RuaOTA;Kayla Perkinson, OT SLP Present: Feliberto Gottronourtney Payne, SLP PPS Coordinator present : Tora DuckMarie Noel, RN, CRRN     Current Status/Progress Goal Weekly Team Focus  Medical   SOB without drop in O2 sat, poor activity level  improve activity  empiric trial of low dose BID Xanax   Bowel/Bladder   continent of bowel and bladder. LBM 06/27/14.   supervision.  manage bowel and bladder with min assit.   Swallow/Nutrition/ Hydration             ADL's   mod-max for ADLs and  functional transfers  min A; likely to downgrade to mod A   functional transfers, functional mobility, activity tolerance, safety awareness, postural control, attention to L side, L NMR, sitting balance, standing balance, and patient/family education    Mobility   mod-max A overall for short distance gait, transfers, stairs, min A for w/c mobility with increased time and bed mobility, fatigues very quickly  supervision overall, plan to downgrade to min-mod A  functional transfers, midline orientation, upright posture, sit <> stand, gait, stairs, sitting/standing balance, activity tolerance, pt/family education   Communication             Safety/Cognition/ Behavioral Observations  Supervision-Min A, [place] Level VII  Supervision   recall, awareness, functional problem solving   Pain   denies pain  pain less than 3  assess pain Q4 hours and medicate as needed   Skin   no skin issues at this time  remain free of breakdown and infection while on rehab  assess skin Qshift    Rehab Goals Patient on target to meet rehab goals: No Rehab Goals Revised: anticipate downgrading of most goals to mod assist overall *See Care Plan and progress notes for long and short-term goals.  Barriers to Discharge: see above    Possible Resolutions to Barriers:  cont rehab with     Discharge Planning/Teaching Needs:  Initially, had hoped that pt could d/c home with daughter and grandtr providing 24/7 assist, however, they can only do this  for a short time.  Will follow up after team conference, however, anticipate a change in d/c plan to SNF.      Team Discussion:  Still fatigues very easily;  resp rate quickly increases, however, not symptomatic.  Still mod/ max overall with heavy lean to left.  Anticipate downgrading of goals to mod assist overall.  Difficulty tolerating 3 1/2 hrs /day but will try rest breaks for now.  SW reports anticipates plan to change to SNF.  Revisions to Treatment Plan:  Downgrading  of overall goals and likely change of d/c plan to SNF   Continued Need for Acute Rehabilitation Level of Care: The patient requires daily medical management by a physician with specialized training in physical medicine and rehabilitation for the following conditions: Daily direction of a multidisciplinary physical rehabilitation program to ensure safe treatment while eliciting the highest outcome that is of practical value to the patient.: Yes Daily medical management of patient stability for increased activity during participation in an intensive rehabilitation regime.: Yes Daily analysis of laboratory values and/or radiology reports with any subsequent need for medication adjustment of medical intervention for : Neurological problems;Other  Akiel Fennell 06/30/2014, 2:28 PM

## 2014-06-30 NOTE — Progress Notes (Signed)
Speech Language Pathology Daily Session Note  Patient Details  Name: [name] MRN: [id number] Date of Birth: 08/25/1920  Today's Date: 06/30/2014 SLP Individual Time: 1000-1100 SLP Individual Time Calculation (min): 60 min  Short Term Goals: Week 2: SLP Short Term Goal 1 (Week 2): Patient will attend to left field of enviornment/body during functional and familiar tasks with Min A verbal and question cues. SLP Short Term Goal 2 (Week 2): Patient will recall new, daily information with use of visual aids with supervision verbal and question cues.  SLP Short Term Goal 3 (Week 2): Patient will self-monitor and correct errors during functional and familiar tasks with supervision verbal and question cues.  SLP Short Term Goal 4 (Week 2): Patient will demonstrate functional problem solving for mildly complex tasks with supervision verbal and question cues.   Skilled Therapeutic Interventions: Skilled treatment session with RT focused on cognitive goals. Upon arrival, patient was asleep while supine in bed but was easily awakened and agreeable to participate.  Patient required Mod A for bed mobility due to fatigue and Min A for sitting EOB.  Patient performed squat pivot transfer from bed-> w/c with Max A and cues for technique with RT.  Once in the dayroom, patient performed modified jigsaw puzzle activity with supervision-Min A verbal cues for attention and use of LUE while supported and Mod A verbal and visual cues for problem solving with task. Patient left upright in wheelchair with quick release belt in place and all needs within reach. Continue with current plan of care.    FIM:  Comprehension Comprehension Mode: Auditory Comprehension: 5-Follows basic conversation/direction: With no assist Expression Expression Mode: Verbal Expression: 5-Expresses basic needs/ideas: With no assist Social Interaction Social Interaction: 6-Interacts appropriately with others with medication or extra time  (anti-anxiety, antidepressant). Problem Solving Problem Solving: 4-Solves basic 75 - 89% of the time/requires cueing 10 - 24% of the time Memory Memory: 4-Recognizes or recalls 75 - 89% of the time/requires cueing 10 - 24% of the time  Pain Pain Assessment Pain Assessment: No/denies pain  Therapy/Group: Individual Therapy  Zyden Suman 06/30/2014, 4:31 PM

## 2014-06-30 NOTE — Progress Notes (Signed)
[place] PHYSICAL MEDICINE & REHABILITATION     PROGRESS NOTE    Subjective/Complaints: Still feeling tired at end of day. Slept well again. Pain controlled..  Pt denies nausea, vomiting, abdominal pain, diarrhea, chest pain, shortness of breath, palpitations, dizziness   Objective: Vital Signs: Blood pressure 102/41, pulse 65, temperature 98.2 F (36.8 C), temperature source Oral, resp. rate 17, height 5\' 6"  (1.676 m), weight 46.1 kg (101 lb 10.1 oz), SpO2 97 %. No results found. No results for input(s): WBC, HGB, HCT, PLT in the last 72 hours. No results for input(s): NA, K, CL, GLUCOSE, BUN, CREATININE, CALCIUM in the last 72 hours.  Invalid input(s): CO CBG (last 3)  No results for input(s): GLUCAP in the last 72 hours.  Wt Readings from Last 3 Encounters:  06/29/14 46.1 kg (101 lb 10.1 oz)  06/16/14 50.6 kg (111 lb 8.8 oz)  06/07/14 52.164 kg (115 lb)    Physical Exam:  Constitutional: She is oriented to person, place, and time. She appears well-developed and well-nourished. No distress.  Thin elderly female. Fatigued appearing but NAD.  HENT:  Head: Normocephalic.   Eyes: Conjunctivae and EOM are normal. Pupils are equal, round, and reactive to light.  Neck: Normal range of motion. Neck supple. No JVD present. No tracheal deviation present. No thyromegaly present.  Cardiovascular: Normal rate. An irregularly irregular rhythm present.  Systolic murmur heard. Respiratory: Effort normal and breath sounds normal. No respiratory distress. She has no wheezes.  GI: Soft. Bowel sounds are normal. She exhibits no distension. There is no tenderness. There is no rebound.  Musculoskeletal: She exhibits no edema or tenderness.  Kyphosis noted. RUE bruising improved.  Ext: distal digits cool Neurological: She is alert and oriented to person, place, and time.  Patient is oriented to person, place and date of birth. Remains very alert.  Good attention LUE and LLE 4/5  deltoid,bicep, tricep, wrist, HF, KE and ankle. Trace sensory loss along left face and left leg and arm. No resting tremor or rigidity.  Skin: Skin is warm and dry. She is not diaphoretic.  Psychiatric: She has a normal mood and affect.    Assessment/Plan: 1. Functional deficits secondary to TBI after fall which require 3+ hours per day of interdisciplinary therapy in a comprehensive inpatient rehab setting. Physiatrist is providing close team supervision and 24 hour management of active medical problems listed below. Physiatrist and rehab team continue to assess barriers to discharge/monitor patient progress toward functional and medical goals.  FIM: FIM - Bathing Bathing Steps Patient Completed: Chest, Right upper leg, Left upper leg, Buttocks, Left Arm, Front perineal area, Abdomen, Right lower leg (including foot), Left lower leg (including foot) Bathing: 4: Min-Patient completes 8-9 [id number] 10 parts or 75+ percent  FIM - Upper Body Dressing/Undressing Upper body dressing/undressing steps patient completed: Thread/unthread right sleeve of pullover shirt/dresss, Thread/unthread left sleeve of pullover shirt/dress, Put head through opening of pull over shirt/dress, Pull shirt over trunk Upper body dressing/undressing: 4: Steadying assist FIM - Lower Body Dressing/Undressing Lower body dressing/undressing steps patient completed: Thread/unthread right underwear leg, Thread/unthread left underwear leg, Thread/unthread right pants leg, Thread/unthread left pants leg, Don/Doff right sock, Don/Doff left sock, Pull underwear up/down, Pull pants up/down Lower body dressing/undressing: 4: Min-Patient completed 75 plus % of tasks  FIM - Toileting Toileting steps completed by patient: Performs perineal hygiene Toileting Assistive Devices: Grab bar or rail for support Toileting: 2: Max-Patient completed 1 of 3 steps  FIM - Best boyToilet Transfers Toilet Transfers  Assistive Devices: Therapist, music  Transfers: 2-From toilet/BSC: Max A (lift and lower assist), 3-From toilet/BSC: Mod A (lift or lower assist)  FIM - Banker Devices: Bed rails Bed/Chair Transfer: 3: Chair or W/C > Bed: Mod A (lift or lower assist)  FIM - Locomotion: Wheelchair Distance: 150 ft Locomotion: Wheelchair: 5: Travels 150 ft or more: maneuvers on rugs and over door sills with supervision, cueing or coaxing FIM - Locomotion: Ambulation Locomotion: Ambulation Assistive Devices: Orthosis, Walker - Rolling (L hand orthosis) Ambulation/Gait Assistance: 4: Min assist, 3: Mod assist, 2: Max assist Locomotion: Ambulation: 1: Travels less than 50 ft with maximal assistance (Pt: 25 - 49%)  Comprehension Comprehension Mode: Auditory Comprehension: 5-Understands complex 90% of the time/Cues < 10% of the time  Expression Expression Mode: Verbal Expression: 5-Expresses basic needs/ideas: With no assist  Social Interaction Social Interaction: 6-Interacts appropriately with others with medication or extra time (anti-anxiety, antidepressant).  Problem Solving Problem Solving Mode: Not assessed Problem Solving: 5-Solves basic problems: With no assist  Memory Memory: 5-Recognizes or recalls 90% of the time/requires cueing < 10% of the time  Medical Problem List and Plan: 1. Functional deficits secondary to TBI after fall  2. DVT Prophylaxis/Anticoagulation: Mechanical: Sequential compression devices, below knee Bilateral lower extremities 3. Pain Management: Tylenol prn.  4. Mood: On Celexa daily. LCSW to follow for evaluation and support.  5. Neuropsych: This patient is capable of making decisions on her own behalf. 6. Skin/Wound Care: Routine pressure relief measures. Scalp laceration healing well--remove scalp staples early next week  7. Fluids/Electrolytes/Nutrition: Monitor I/O.  Encourage adequate po intake. Labs normal today  8. HTN: Monitor BP every 8 hours. Has  been labile and medications increased to bid. H/o orthostasis in the past--monitor for symptoms.  9. Dyslipidemia: On hold due to ICH. Resume at discharge.  10. ABLA:  iron supplement.  11. PAF with h/o flash edema due to RVR: On amiodarone.  HR better controlled.  12. CAD: Has been managed medically without symptoms.  13. IBS: diet/supplements 14. Raynaud's phenomenon--keep room warmer.  LOS (Days) 9 A FACE TO FACE EVALUATION WAS PERFORMED  SWARTZ,ZACHARY T 06/30/2014 8:45 AM

## 2014-06-30 NOTE — Progress Notes (Signed)
Nutrition Follow-up  DOCUMENTATION CODES:  Non-severe (moderate) malnutrition in context of chronic illness, Underweight  INTERVENTION:  Ensure Enlive (each supplement provides 350kcal and 20 grams of protein), Magic cup   Encourage adequate PO intake.   NUTRITION DIAGNOSIS:  Inadequate oral intake related to  (decreased appetite) as evidenced by  (varied meal completion of 20-100%); improving  GOAL:  Patient will meet greater than or equal to 90% of their needs, progressing  MONITOR:  PO intake, Supplement acceptance, Weight trends, Labs, I & O's  REASON FOR ASSESSMENT:   (Low BMI)    ASSESSMENT: Pt with history of CAD, HTN, Afib who was admitted on 06/16/14 after a fall while going down stairs. Pt found to have right thalamic hemorrhage with additional small hemorrhagic foci. CCT done for monitoring revealing stable hematoma.  Pt reports her appetite is fine. Meal completion has been 50-100%. Pt has been consuming her Ensure. Noted weight has been trending down. RD to modify supplement order to aid in further weight loss. Pt was encouraged to eat her food at meals and to drink her supplements.   Labs and medications reviewed.  Height:  Ht Readings from Last 1 Encounters:  06/21/14 5\' 6"  (1.676 m)    Weight:  Wt Readings from Last 1 Encounters:  06/29/14 101 lb 10.1 oz (46.1 kg)    Ideal Body Weight:  59 kg  Wt Readings from Last 10 Encounters:  06/29/14 101 lb 10.1 oz (46.1 kg)  06/16/14 111 lb 8.8 oz (50.6 kg)  06/07/14 115 lb (52.164 kg)  12/01/13 108 lb 6.4 oz (49.17 kg)  06/02/13 103 lb 6.4 oz (46.902 kg)  11/03/12 109 lb (49.442 kg)  06/15/12 109 lb (49.442 kg)  02/08/11 115 lb (52.164 kg)  10/30/10 112 lb (50.803 kg)  10/09/10 112 lb (50.803 kg)    BMI:  Body mass index is 16.41 kg/(m^2). Underweight  Estimated Nutritional Needs:  Kcal:  1550-1750  Protein:  70-85 grams  Fluid:  1.5 - 1.7 L/day  Skin:   (Laceration on head)  Diet  Order:  Diet regular Room service appropriate?: Yes; Fluid consistency:: Thin  EDUCATION NEEDS:  No education needs identified at this time   Intake/Output Summary (Last 24 hours) at 06/30/14 1453 Last data filed at 06/30/14 1100  Gross per 24 hour  Intake    360 ml  Output      1 ml  Net    359 ml    Last BM:  5/30  Marijean NiemannStephanie La, MS, RD, LDN Pager # [phone] After hours/ weekend pager # [phone]

## 2014-07-01 ENCOUNTER — Inpatient Hospital Stay (HOSPITAL_COMMUNITY): Payer: Medicare Other | Admitting: Occupational Therapy

## 2014-07-01 ENCOUNTER — Inpatient Hospital Stay (HOSPITAL_COMMUNITY): Payer: Medicare Other | Admitting: Physical Therapy

## 2014-07-01 ENCOUNTER — Inpatient Hospital Stay (HOSPITAL_COMMUNITY): Payer: Medicare Other | Admitting: Speech Pathology

## 2014-07-01 LAB — CBC
HCT: 35.5 % — ABNORMAL LOW (ref 36.0–46.0)
HEMOGLOBIN: 11.2 g/dL — AB (ref 12.0–15.0)
MCH: 29.7 pg (ref 26.0–34.0)
MCHC: 31.5 g/dL (ref 30.0–36.0)
MCV: 94.2 fL (ref 78.0–100.0)
Platelets: 353 10*3/uL (ref 150–400)
RBC: 3.77 MIL/uL — AB (ref 3.87–5.11)
RDW: 14 % (ref 11.5–15.5)
WBC: 8.1 10*3/uL (ref 4.0–10.5)

## 2014-07-01 LAB — BASIC METABOLIC PANEL
ANION GAP: 9 (ref 5–15)
BUN: 18 mg/dL (ref 6–20)
CO2: 26 mmol/L (ref 22–32)
CREATININE: 0.7 mg/dL (ref 0.44–1.00)
Calcium: 8.9 mg/dL (ref 8.9–10.3)
Chloride: 101 mmol/L (ref 101–111)
GFR calc Af Amer: >60 mL/min (ref >60)
GFR calc non Af Amer: >60 mL/min (ref >60)
GLUCOSE: 76 mg/dL (ref 65–99)
Potassium: 4.4 mmol/L (ref 3.5–5.1)
Sodium: 136 mmol/L (ref 135–145)

## 2014-07-01 LAB — PREALBUMIN: Prealbumin: 25.4 mg/dL (ref 18–38)

## 2014-07-01 MED ORDER — CITALOPRAM HYDROBROMIDE 10 MG PO TABS
10.0000 mg | ORAL_TABLET | Freq: Every day | ORAL | Status: DC
  Administered 2014-07-02 – 2014-07-06 (×5): 10 mg via ORAL
  Filled 2014-07-01 (×6): qty 1

## 2014-07-01 MED ORDER — ALPRAZOLAM 0.25 MG PO TABS: 0.1250 mg | ORAL_TABLET | Freq: Two times a day (BID) | ORAL | Status: DC

## 2014-07-01 NOTE — Progress Notes (Signed)
Speech Language Pathology Daily Session Note  Patient Details  Name: [name] MRN: [id number] Date of Birth: 10/28/1920  Today's Date: 07/01/2014 SLP Individual Time: 0800-0900 SLP Individual Time Calculation (min): 60 min  Short Term Goals: Week 2: SLP Short Term Goal 1 (Week 2): Patient will attend to left field of enviornment/body during functional and familiar tasks with Min A verbal and question cues. SLP Short Term Goal 2 (Week 2): Patient will recall new, daily information with use of visual aids with supervision verbal and question cues.  SLP Short Term Goal 3 (Week 2): Patient will self-monitor and correct errors during functional and familiar tasks with supervision verbal and question cues.  SLP Short Term Goal 4 (Week 2): Patient will demonstrate functional problem solving for mildly complex tasks with supervision verbal and question cues.   Skilled Therapeutic Interventions: Skilled treatment session focused on cognitive goals. Upon arrival, patient was awake while sitting upright in bed and appeared brighter and more alert compared to yesterday's session. Patient requested to get out of bed and transferred to wheelchair with assistance from NT.  Patient participated in a 6 step picture sequencing task with extra time and Min A verbal and visual cues for problem solving and attention to LUE throughout task. Patient left upright in wheelchair with quick release belt in place and all needs within reach. Continue with current plan of care.    FIM:  Comprehension Comprehension Mode: Auditory Comprehension: 5-Follows basic conversation/direction: With no assist Expression Expression Mode: Verbal Expression: 5-Expresses basic needs/ideas: With no assist Social Interaction Social Interaction: 6-Interacts appropriately with others with medication or extra time (anti-anxiety, antidepressant). Problem Solving Problem Solving: 4-Solves basic 75 - 89% of the time/requires cueing 10 - 24%  of the time Memory Memory: 4-Recognizes or recalls 75 - 89% of the time/requires cueing 10 - 24% of the time FIM - Eating Eating Activity: 5: Set-up assist for open containers;5: Set-up assist for cut food  Pain Pain Assessment Pain Assessment: No/denies pain  Therapy/Group: Individual Therapy  Tyke Outman 07/01/2014, 3:51 PM

## 2014-07-01 NOTE — Progress Notes (Signed)
Physical Therapy Session Note  Patient Details  Name: [name] MRN: [id number] Date of Birth: 06-11-1920  Today's Date: 07/01/2014 PT Individual Time: 1107-1203 PT Individual Time Calculation (min): 56 min   Short Term Goals: Week 1:  PT Short Term Goal 1 (Week 1): Patient will perform bed mobility with HOB flat with consistent min A.  PT Short Term Goal 1 - Progress (Week 1): Progressing toward goal PT Short Term Goal 2 (Week 1): Patient will transfer bed <>wheelchair with consistent min A.  PT Short Term Goal 2 - Progress (Week 1): Not met PT Short Term Goal 3 (Week 1): Patient will ambulate 150 ft using LRAD with min A. PT Short Term Goal 3 - Progress (Week 1): Not met PT Short Term Goal 4 (Week 1): Patient will maintain standing balance x 2 min during functional task with min A.  PT Short Term Goal 4 - Progress (Week 1): Not met PT Short Term Goal 5 (Week 1): Patient will negotiate up/down 4 stairs using 2 rails with min A.  PT Short Term Goal 5 - Progress (Week 1): Not met  Skilled Therapeutic Interventions/Progress Updates:   Pt received in bed; pt fatigued but willing to participate.  Assisted with donning KHT and shoes and then pt transitioned supine > sit from flat bed, no rail with min A to complete forward, R weight shift to sit EOB.  Performed transfer bed > w/c to R squat pivot with mod A with extra time to allow pt to initiate reaching and weight shift forwards and to the R; required mod lifting assistance and assistance to fully complete R weight shift and pivot to R.  Performed w/c mobility in controlled environment with bilat foot propulsion x 150' with min A and  verbal, visual and tactile cues for use of environmental cues to find gym and attention to/use of LLE during propulsion.  In gym performed stair negotiation training and recall of safe sequence for home entry/exit.  Pt able to verbalize safe step to sequence but pt did require min-mod verbal cues to demonstrate safe  step to sequence.  Performed up/down 4 steps (6") x 2 reps with bilat UE support on rail and mod A overall for weight shifting and balance with mod-max verbal cues for sequencing and attention to LUE and LLE placement.  Performed NMR; see below for details.  Pt returned to room and set up with quick release belt in place and tray table for lunch.      Therapy Documentation Precautions:  Precautions Precautions: Fall Precaution Comments: L listing Restrictions Weight Bearing Restrictions: No Vital Signs: HR: 74 BPM at rest Pain: Pain Assessment Pain Assessment: No/denies pain Pain Score: 0-No pain Locomotion : Ambulation Ambulation/Gait Assistance: 1: +2 Total assist Wheelchair Mobility Distance: 150  Other Treatments: Treatments Neuromuscular Facilitation: Right;Left;Lower Extremity;Activity to increase motor control;Activity to increase timing and sequencing;Activity to increase coordination;Activity to increase grading;Activity to increase sustained activation;Activity to increase lateral weight shifting;Activity to increase anterior-posterior weight shifting with use of functional reaching up, forwards and to the R for target beginning in sitting progressing to transitional sit <> stand and then to gait x 15' x 2 reps with +2 to focus on active weight shifting anterior and to the R for increased midline orientation and minimize L lateropulsion; required mod-max verbal and tactile facilitation + extra time for initiation.  As session progressed pt became more fatigued and required increased encouragement of progress.    See FIM for current functional  status  Therapy/Group: Individual Therapy  Raylene Everts [facility] 07/01/2014, 12:15 PM

## 2014-07-01 NOTE — Progress Notes (Signed)
Social Work Patient ID: [name], female   DOB: 1920-05-02, 81 y.o.   MRN: [id number]   Met with daughter yesterday and with pt today to review team conference.  Daughter spoke at length about her feelings of guilt that she cannot meet care needs of pt at home, but she does work f/t and can only take a limited couple of weeks off.  She feels that her only option at this point is to have mother transition to a SNF from here.   Spoke with pt about her limited progress but offering encouragement as well.  Discussed concern that she will need support for longer than just a couple of weeks if she returns home directly from [facility].  Explained option of SNF and pt is agreeable with doing this.  She recognizes that her progress has been slow and "..If [facility] can get me in that would be good..."  Will begin SNF bed search process.  Charleene Callegari, LCSW

## 2014-07-01 NOTE — Progress Notes (Signed)
Occupational Therapy Session Note  Patient Details  Name: LOVELLE DEITRICK MRN: [id number] Date of Birth: [birth year]  Today's Date: 07/01/2014 OT Individual Time:  -   0930-1030  (60 min)      Short Term Goals: Week 1:  OT Short Term Goal 1 (Week 1): Pt will complete UB dressing with Mod A OT Short Term Goal 1 - Progress (Week 1): Met OT Short Term Goal 2 (Week 1): Pt will complete LB dressing with Mod A OT Short Term Goal 2 - Progress (Week 1): Not met OT Short Term Goal 3 (Week 1): Pt will complete toileting routine with Mod A OT Short Term Goal 3 - Progress (Week 1): Not met OT Short Term Goal 4 (Week 1): Pt will complete toilet transfer with Mod A  OT Short Term Goal 4 - Progress (Week 1): Partly met OT Short Term Goal 5 (Week 1): Pt will complete functional activity in standing for 1 min with Min A OT Short Term Goal 5 - Progress (Week 1): Partly met Week 2:  OT Short Term Goal 1 (Week 2): Pt will perform UB dressing with Min A. OT Short Term Goal 2 (Week 2): Pt will perform LB dressing with Mod A.  OT Short Term Goal 3 (Week 2): Pt will consistently perform functional transfer with Mod A.  OT Short Term Goal 4 (Week 2): Pt will complete functional activity in standing for 90 seconds with Mod A.           Skilled Therapeutic Interventions/Progress Updates:     1st session:  Addressed the following:  postural control, midline orientation, sitting/standing balance, attention to L side, L NMR, activity tolerance, all functional mobility.  Pt. Sitting in wc leaning over towards bed.  She reported being tired.  Went to bathroom in wc and transferred to toilet with mod assist  Plus cues for upright posture.  Pt. Continent of bowel and bladder.  Performed bathing at sink with assistance with LB and right arm.  Completed dressing with assistance for RUE and sit to stand.    Pt. Left in wc with all needs in reach.    2nd session:    Time:1500-1530  (30 min) Pain: none Individual  session Addressed bed mobility, transfers, sit to stand, midline control.  Pt. Lying in bed.  Went from supine to sit with mod assist.  Transferred from bed to wc with mod assist plus assist with upright posture posture.  Transferred to toilet with mod assist.  Needed assist with clothes for doffing and donning.  Pt. Transferred back to wc after urination.  Sat in wc to wash hands.  Transferred back to bed with mod assist and left with all needs in reach.      Therapy Documentation Precautions:  Precautions Precautions: Fall Precaution Comments: L listing Restrictions Weight Bearing Restrictions: No      Pain:  none           See FIM for current functional status  Therapy/Group: Individual Therapy  Lisa Roca 07/01/2014, 8:18 AM

## 2014-07-01 NOTE — Progress Notes (Signed)
[facility]IC[facility]ABILITATION     PROGRESS NOTE    Subjective/Complaints: Still feeling tired." I'm getting by".  Pt denies nausea, vomiting, abdominal pain, diarrhea, chest pain, shortness of breath, palpitations, dizziness   Objective: Vital Signs: Blood pressure 147/51, pulse 62, temperature 97.3 F (36.3 C), temperature source Oral, resp. rate 18, height 5\' 6"  (1.676 m), weight 46.1 kg (101 lb 10.1 oz), SpO2 98 %. No results found. No results for input(s): WBC, HGB, HCT, PLT in the last 72 hours. No results for input(s): NA, K, CL, GLUCOSE, BUN, CREATININE, CALCIUM in the last 72 hours.  Invalid input(s): CO CBG (last 3)  No results for input(s): GLUCAP in the last 72 hours.  Wt Readings from Last 3 Encounters:  06/29/14 46.1 kg (101 lb 10.1 oz)  06/16/14 50.6 kg (111 lb 8.8 oz)  06/07/14 52.164 kg (115 lb)    Physical Exam:  Constitutional: She is oriented to person, place, and time. She appears well-developed and well-nourished. No distress.  Thin elderly female. Fatigued appearing but NAD.  HENT:  Head: Normocephalic.   Eyes: Conjunctivae and EOM are normal. Pupils are equal, round, and reactive to light.  Neck: Normal range of motion. Neck supple. No JVD present. No tracheal deviation present. No thyromegaly present.  Cardiovascular: Normal rate. An irregularly irregular rhythm present.  Systolic murmur heard. Respiratory: Effort normal and breath sounds normal. No respiratory distress. She has no wheezes.  GI: Soft. Bowel sounds are normal. She exhibits no distension. There is no tenderness. There is no rebound.  Musculoskeletal: She exhibits no edema or tenderness.  Kyphosis noted. RUE bruising improved.  Ext: distal digits cool Neurological: She is alert and oriented to person, place, and time.  Patient is oriented to person, place and date of birth. Remains very alert.  Good attention LUE and LLE 4/5 deltoid,bicep, tricep, wrist, HF, KE and  ankle.   No resting tremor, tone, or rigidity.  Skin: Skin is warm and dry. She is not diaphoretic.  Psychiatric: She has a normal mood and affect.    Assessment/Plan: 1. Functional deficits secondary to TBI after fall which require 3+ hours per day of interdisciplinary therapy in a comprehensive inpatient rehab setting. Physiatrist is providing close team supervision and 24 hour management of active medical problems listed below. Physiatrist and rehab team continue to assess barriers to discharge/monitor patient progress toward functional and medical goals.  FIM: FIM - Bathing Bathing Steps Patient Completed: Chest, Right upper leg, Left upper leg, Buttocks, Left Arm, Front perineal area, Abdomen, Right lower leg (including foot), Left lower leg (including foot) Bathing: 4: Min-Patient completes 8-9 [id number] 10 parts or 75+ percent  FIM - Upper Body Dressing/Undressing Upper body dressing/undressing steps patient completed: Thread/unthread right sleeve of pullover shirt/dresss, Thread/unthread left sleeve of pullover shirt/dress Upper body dressing/undressing: 3: Mod-Patient completed 50-74% of tasks FIM - Lower Body Dressing/Undressing Lower body dressing/undressing steps patient completed: Thread/unthread right underwear leg, Thread/unthread right pants leg, Pull underwear up/down Lower body dressing/undressing: 2: Max-Patient completed 25-49% of tasks  FIM - Toileting Toileting steps completed by patient: Performs perineal hygiene Toileting Assistive Devices: Grab bar or rail for support Toileting: 2: Max-Patient completed 1 of 3 steps  FIM - Diplomatic Services operational officerToilet Transfers Toilet Transfers Assistive Devices: Grab bars Toilet Transfers: 3-From toilet/BSC: Mod A (lift or lower assist), 2-To toilet/BSC: Max A (lift and lower assist)  FIM - BankerBed/Chair Transfer Bed/Chair Transfer Assistive Devices: Arm rests, Bed rails Bed/Chair Transfer: 2: Chair or W/C >  Bed: Max A (lift and lower assist), 2: Bed >  Chair or W/C: Max A (lift and lower assist), 3: Sit > Supine: Mod A (lifting assist/Pt. 50-74%/lift 2 legs), 3: Supine > Sit: Mod A (lifting assist/Pt. 50-74%/lift 2 legs  FIM - Locomotion: Wheelchair Distance: 150 ft Locomotion: Wheelchair: 5: Travels 150 ft or more: maneuvers on rugs and over door sills with supervision, cueing or coaxing FIM - Locomotion: Ambulation Locomotion: Ambulation Assistive Devices: Orthosis, Walker - Rolling (L hand orthosis) Ambulation/Gait Assistance: 2: Max assist Locomotion: Ambulation: 1: Travels less than 50 ft with maximal assistance (Pt: 25 - 49%)  Comprehension Comprehension Mode: Auditory Comprehension: 5-Follows basic conversation/direction: With no assist  Expression Expression Mode: Verbal Expression: 5-Expresses basic needs/ideas: With no assist  Social Interaction Social Interaction: 6-Interacts appropriately with others with medication or extra time (anti-anxiety, antidepressant).  Problem Solving Problem Solving Mode: Not assessed Problem Solving: 4-Solves basic 75 - 89% of the time/requires cueing 10 - 24% of the time  Memory Memory: 4-Recognizes or recalls 75 - 89% of the time/requires cueing 10 - 24% of the time  Medical Problem List and Plan: 1. Functional deficits secondary to TBI after fall  2. DVT Prophylaxis/Anticoagulation: Mechanical: Sequential compression devices, below knee Bilateral lower extremities 3. Pain Management: Tylenol prn.  4. Mood: On Celexa daily. LCSW to follow for evaluation and support.  5. Neuropsych: This patient is capable of making decisions on her own behalf. 6. Skin/Wound Care: Routine pressure relief measures. Scalp laceration healing well--remove scalp staples early next week  7. Fluids/Electrolytes/Nutrition: Monitor I/O. Intake poor/borderline---ask RD to follow up  -check bmet/prealbumin today 8. HTN: Monitor BP every 8 hours. Has been labile and medications increased to bid. H/o  orthostasis in the past--monitor for symptoms.  9. Dyslipidemia: On hold due to ICH. Resume at discharge.  10. ABLA:  iron supplement. recheck labs today 11. PAF with h/o flash edema due to RVR: On amiodarone.  HR better controlled.  12. CAD: Has been managed medically without symptoms.  13. IBS: diet/supplements 14. Raynaud's phenomenon--keeping room warmer. No change in plan  LOS (Days) 10 A FACE TO FACE EVALUATION WAS PERFORMED  SWARTZ,ZACHARY T 07/01/2014 8:17 AM

## 2014-07-02 ENCOUNTER — Inpatient Hospital Stay (HOSPITAL_COMMUNITY): Payer: Medicare Other | Admitting: *Deleted

## 2014-07-02 NOTE — Progress Notes (Signed)
[name] is a 81 y.o. female January 25, 1921 [id number]  Subjective: No new complaints. No new problems. Slept well. Feeling OK. "Just tired"  Objective: Vital signs in last 24 hours: Temp:  [98 F (36.7 C)-98.2 F (36.8 C)] 98 F (36.7 C) (06/04 0457) Pulse Rate:  [69] 69 (06/03 1548) Resp:  [18] 18 (06/04 0457) BP: (127)/(55) 127/55 mmHg (06/03 1548) SpO2:  [99 %-100 %] 99 % (06/04 0457) Weight change:  Last BM Date: 07/01/14  Intake/Output from previous day: 06/03 0701 - 06/04 0700 In: 360 [P.O.:360] Out: -   Physical Exam General: No apparent distress   Sitting in [facility] Lungs: Normal effort. Lungs clear to auscultation, no crackles or wheezes. Cardiovascular: irregular rate and rhythm, no edema Musculoskeletal:  grossly intact Neurological: No new neurological deficits Wounds:   Clean, dry, intact. No signs of infection.  Lab Results: BMET    Component Value Date/Time   NA 136 07/01/2014 0955   K 4.4 07/01/2014 0955   CL 101 07/01/2014 0955   CO2 26 07/01/2014 0955   GLUCOSE 76 07/01/2014 0955   BUN 18 07/01/2014 0955   CREATININE 0.70 07/01/2014 0955   CALCIUM 8.9 07/01/2014 0955   GFRNONAA >60 07/01/2014 0955   GFRAA >60 07/01/2014 0955   CBC    Component Value Date/Time   WBC 8.1 07/01/2014 0955   RBC 3.77* 07/01/2014 0955   HGB 11.2* 07/01/2014 0955   HCT 35.5* 07/01/2014 0955   PLT 353 07/01/2014 0955   MCV 94.2 07/01/2014 0955   MCH 29.7 07/01/2014 0955   MCHC 31.5 07/01/2014 0955   RDW 14.0 07/01/2014 0955   LYMPHSABS 1.7 06/22/2014 0543   MONOABS 0.8 06/22/2014 0543   EOSABS 0.1 06/22/2014 0543   BASOSABS 0.0 06/22/2014 0543   CBG's (last 3):  No results for input(s): GLUCAP in the last 72 hours. LFT's Lab Results  Component Value Date   ALT 12* 06/22/2014   AST 14* 06/22/2014   ALKPHOS 81 06/22/2014   BILITOT 0.7 06/22/2014    Studies/Results: No results found.  Medications:  I have reviewed the patient's current  medications. Scheduled Medications: . amiodarone  100 mg Oral Daily  . citalopram  10 mg Oral Daily  . feeding supplement (ENSURE ENLIVE)  237 mL Oral BID BM  . irbesartan  150 mg Oral BID  . pantoprazole  40 mg Oral Daily   PRN Medications: acetaminophen, alum & mag hydroxide-simeth, bisacodyl, docusate sodium, guaiFENesin-dextromethorphan, prochlorperazine **OR** prochlorperazine **OR** prochlorperazine, traZODone  Assessment/Plan: Principal Problem:   Mild TBI Active Problems:   PAF (paroxysmal atrial fibrillation)   CAD (coronary artery disease) of artery bypass graft  1. Functional deficits secondary to TBI after fall  2. DVT Prophylaxis/Anticoagulation: Mechanical: Sequential compression devices, below knee Bilateral lower extremities 3. Pain Management: Tylenol prn.  4. Mood: On Celexa daily. LCSW to follow for evaluation and support.  5. Neuropsych: This patient is capable of making decisions on her own behalf. 6. Skin/Wound Care: Routine pressure relief measures. Scalp laceration healing well--remove scalp staples early next week  7. Fluids/Electrolytes/Nutrition: Monitor I/O. Intake poor/borderline--RD following 8. HTN: Monitor BP every 8 hours. Has been labile so medications increased to bid. H/o orthostasis in the past--monitor for symptoms.  9. Dyslipidemia: On hold due to ICH. Resume at discharge.  10. ABLA: iron supplement. Hgb improved 11. PAF with h/o flash edema due to RVR: On amiodarone. HR controlled.  12. CAD: Has been managed medically; patient without symptoms.  13. IBS: diet/supplements  14. Raynaud's phenomenon--keeping room warmer. No change in plan  Length of stay, days: 11   Edwinna Rochette A. Felicity CoyerLeschber, MD 07/02/2014, 11:10 AM

## 2014-07-02 NOTE — Progress Notes (Signed)
Physical Therapy Session Note  Patient Details  Name: [name] MRN: [id number] Date of Birth: 03/16/1920  Today's Date: 07/02/2014 PT Individual Time: 1100-1130 PT Individual Time Calculation (min): 30 min    Skilled Therapeutic Interventions/Progress Updates:   Patient in bed at the beginning of the session, agrees to PT interventions, no c/o pain. Transfer to sitting EOB with mod A and transfer to w/c with min A and cues for sequencing. W/c propulsion training to day room and at the end of session back to room.  Gait Training 2 x 45 feet on carpet with min to mod A and 50% cues for L LE progression, patient tends to "over shoot" during swing- excessive hip and knee flexion to assure progression. Training in postural control during gait with side stepping around a raised table, cued to look outside the window and engage in conversation about the outside to increase automatic reactions.  In room patient asked to go to the bathroom, max A for hygiene and changing clothes.  Patient returned to bed and left with all needs within reach and bed alarm engaged.   Therapy Documentation Precautions:  Precautions Precautions: Fall Precaution Comments: L listing Restrictions Weight Bearing Restrictions: No   See FIM for current functional status  Therapy/Group: Individual Therapy  Dorna MaiCzajkowska, Jahlisa Rossitto W 07/02/2014, 12:43 PM

## 2014-07-03 ENCOUNTER — Inpatient Hospital Stay (HOSPITAL_COMMUNITY): Payer: Medicare Other | Admitting: Occupational Therapy

## 2014-07-03 NOTE — Progress Notes (Signed)
[name] is a 81 y.o. female [birth year] [id number]  Subjective: No new complaints. No new problems. Slept well. Feeling "worn out" but optimistic   Objective: Vital signs in last 24 hours: Temp:  [98.1 F (36.7 C)-98.8 F (37.1 C)] 98.8 F (37.1 C) (06/05 0600) Pulse Rate:  [68] 68 (06/05 0600) Resp:  [18-22] 18 (06/05 0600) BP: (134-148)/(47-62) 148/62 mmHg (06/05 0600) SpO2:  [94 %-99 %] 99 % (06/05 0600) Weight change:  Last BM Date: 07/01/14  Intake/Output from previous day: 06/04 0701 - 06/05 0700 In: 720 [P.O.:720] Out: -   Physical Exam General: No apparent distress   Sitting in [facility] Lungs: Normal effort. Lungs clear to auscultation, no crackles or wheezes. Cardiovascular: irregular rate and rhythm, no edema Musculoskeletal:  grossly intact Neurological: No new neurological deficits Wounds:   Clean, dry, intact. No signs of infection.  Lab Results: BMET    Component Value Date/Time   NA 136 07/01/2014 0955   K 4.4 07/01/2014 0955   CL 101 07/01/2014 0955   CO2 26 07/01/2014 0955   GLUCOSE 76 07/01/2014 0955   BUN 18 07/01/2014 0955   CREATININE 0.70 07/01/2014 0955   CALCIUM 8.9 07/01/2014 0955   GFRNONAA >60 07/01/2014 0955   GFRAA >60 07/01/2014 0955   CBC    Component Value Date/Time   WBC 8.1 07/01/2014 0955   RBC 3.77* 07/01/2014 0955   HGB 11.2* 07/01/2014 0955   HCT 35.5* 07/01/2014 0955   PLT 353 07/01/2014 0955   MCV 94.2 07/01/2014 0955   MCH 29.7 07/01/2014 0955   MCHC 31.5 07/01/2014 0955   RDW 14.0 07/01/2014 0955   LYMPHSABS 1.7 06/22/2014 0543   MONOABS 0.8 06/22/2014 0543   EOSABS 0.1 06/22/2014 0543   BASOSABS 0.0 06/22/2014 0543   CBG's (last 3):  No results for input(s): GLUCAP in the last 72 hours. LFT's Lab Results  Component Value Date   ALT 12* 06/22/2014   AST 14* 06/22/2014   ALKPHOS 81 06/22/2014   BILITOT 0.7 06/22/2014    Studies/Results: No results found.  Medications:  I have reviewed the patient's  current medications. Scheduled Medications: . amiodarone  100 mg Oral Daily  . citalopram  10 mg Oral Daily  . feeding supplement (ENSURE ENLIVE)  237 mL Oral BID BM  . irbesartan  150 mg Oral BID  . pantoprazole  40 mg Oral Daily   PRN Medications: acetaminophen, alum & mag hydroxide-simeth, bisacodyl, docusate sodium, guaiFENesin-dextromethorphan, prochlorperazine **OR** prochlorperazine **OR** prochlorperazine, traZODone  Assessment/Plan: Principal Problem:   Mild TBI Active Problems:   PAF (paroxysmal atrial fibrillation)   CAD (coronary artery disease) of artery bypass graft  1. Functional deficits secondary to TBI after fall  2. DVT Prophylaxis/Anticoagulation: Mechanical: Sequential compression devices, below knee Bilateral lower extremities 3. Pain Management: Tylenol prn.  4. Mood: On Celexa daily. LCSW to follow for evaluation and support.  5. Neuropsych: This patient is capable of making decisions on her own behalf. 6. Skin/Wound Care: Routine pressure relief measures. Scalp laceration healing well-- 7. Fluids/Electrolytes/Nutrition: Monitor I/O. Intake poor/borderline--RD following 8. HTN: Monitor BP every 8 hours. Has been labile so medications increased to bid. H/o orthostasis in the past--monitor for symptoms.  9. Dyslipidemia: On hold due to ICH. Resume at discharge.  10. ABLA: iron supplement. Hgb improved 11. PAF with h/o flash edema due to RVR: On amiodarone. HR controlled.  12. CAD: Has been managed medically; patient without symptoms.  13. IBS: diet/supplements 14. Raynaud's phenomenon--keeping room  warmer. No change in plan  Length of stay, days: 12   Valerie A. Felicity CoyerLeschber, MD 07/03/2014, 8:30 AM

## 2014-07-03 NOTE — Progress Notes (Signed)
Occupational Therapy Session Note  Patient Details  Name: [name] MRN: [id number] Date of Birth: 07/14/1920  Today's Date: 07/03/2014 OT Individual Time: 1030-1100 OT Individual Time Calculation (min): 30 min    Short Term Goals: Week 2:  OT Short Term Goal 1 (Week 2): Pt will perform UB dressing with Min A. OT Short Term Goal 2 (Week 2): Pt will perform LB dressing with Mod A.  OT Short Term Goal 3 (Week 2): Pt will consistently perform functional transfer with Mod A.  OT Short Term Goal 4 (Week 2): Pt will complete functional activity in standing for 90 seconds with Mod A.   Skilled Therapeutic Interventions/Progress Updates:    Pt seen for ADL bathing and dressing session. Pt in w/c upon arrival, voicing desire to complete bathing and dressing. Pt bathed in w/c in front of sink with supervision, declining washing perineal/buttock area stating that nursing had just assisted with that and she was clean. Pt dressed UB with set-up and increased time, requiring min A to doff shirt and increased time to don shirt. Pt able to thread B LEs into pants, and stood with steadying assist to pull pants up with assist required to pull pants up in the back. Pt required assist to thread B socks due to tight fitting socks and decreased grip strength. Shoes donned total A for time management. Pt completed oral hygiene task seated in w/c at sink, requiring increased time to complete fine motor aspects of task. Pt left sitting in w/c at end of session, quick release belt donned and all needs in reach.  Therapy Documentation Precautions:  Precautions Precautions: Fall Precaution Comments: L listing Restrictions Weight Bearing Restrictions: No Pain: Pain Assessment Pain Assessment: No/denies pain  See FIM for current functional status  Therapy/Group: Individual Therapy  Lewis, Royalti Schauf C 07/03/2014, 7:54 AM

## 2014-07-04 ENCOUNTER — Inpatient Hospital Stay (HOSPITAL_COMMUNITY): Payer: Medicare Other | Admitting: Speech Pathology

## 2014-07-04 ENCOUNTER — Inpatient Hospital Stay (HOSPITAL_COMMUNITY): Payer: Medicare Other

## 2014-07-04 ENCOUNTER — Inpatient Hospital Stay (HOSPITAL_COMMUNITY): Payer: Medicare Other | Admitting: Occupational Therapy

## 2014-07-04 NOTE — Progress Notes (Signed)
[facility] PHYSICAL MEDICINE & REHABILITATION     PROGRESS NOTE    Subjective/Complaints: Patient states she does not remember my name but recognizes me. She does remember Dr. Riley KillSwartz.  Pt denies nausea, vomiting, abdominal pain, diarrhea, chest pain, shortness of breath,    Objective: Vital Signs: Blood pressure 132/77, pulse 69, temperature 98.2 F (36.8 C), temperature source Oral, resp. rate 18, height 5\' 6"  (1.676 m), weight 46.1 kg (101 lb 10.1 oz), SpO2 98 %. No results found. No results for input(s): WBC, HGB, HCT, PLT in the last 72 hours. No results for input(s): NA, K, CL, GLUCOSE, BUN, CREATININE, CALCIUM in the last 72 hours.  Invalid input(s): CO CBG (last 3)  No results for input(s): GLUCAP in the last 72 hours.  Wt Readings from Last 3 Encounters:  06/29/14 46.1 kg (101 lb 10.1 oz)  06/16/14 50.6 kg (111 lb 8.8 oz)  06/07/14 52.164 kg (115 lb)    Physical Exam:  Constitutional: She is oriented to person, place, and time. She appears well-developed and well-nourished. No distress.  Thin elderly female. Fatigued appearing but NAD.  HENT:  Head: Normocephalic.   Eyes: Conjunctivae and EOM are normal. Pupils are equal, round, and reactive to light.  Neck: Normal range of motion. Neck supple. No JVD present. No tracheal deviation present. No thyromegaly present.  Cardiovascular: Normal rate. An irregularly irregular rhythm present.  Systolic murmur heard. Respiratory: Effort normal and breath sounds normal. No respiratory distress. She has no wheezes.  GI: Soft. Bowel sounds are normal. She exhibits no distension. There is no tenderness. There is no rebound.  Musculoskeletal: She exhibits no edema or tenderness.  Kyphosis noted. RUE bruising improved.  Ext: distal digits cool Neurological: She is alert and oriented to person, place, and time.  Patient is oriented to person, place and date of birth. Remains very alert.  Good attention LUE and LLE 4/5  deltoid,bicep, tricep, wrist, HF, KE and ankle.   Skin: Skin is warm and dry. She is not diaphoretic.  Psychiatric: She has a normal mood and affect.    Assessment/Plan: 1. Functional deficits secondary to TBI after fall which require 3+ hours per day of interdisciplinary therapy in a comprehensive inpatient rehab setting. Physiatrist is providing close team supervision and 24 hour management of active medical problems listed below. Physiatrist and rehab team continue to assess barriers to discharge/monitor patient progress toward functional and medical goals.  FIM: FIM - Bathing Bathing Steps Patient Completed: Chest, Right upper leg, Left upper leg, Left Arm, Abdomen, Right lower leg (including foot), Left lower leg (including foot), Right Arm Bathing: 5: Supervision: Safety issues/verbal cues  FIM - Upper Body Dressing/Undressing Upper body dressing/undressing steps patient completed: Thread/unthread right sleeve of pullover shirt/dresss, Thread/unthread left sleeve of pullover shirt/dress Upper body dressing/undressing: 3: Mod-Patient completed 50-74% of tasks FIM - Lower Body Dressing/Undressing Lower body dressing/undressing steps patient completed: Thread/unthread right pants leg, Pull underwear up/down, Thread/unthread left pants leg Lower body dressing/undressing: 2: Max-Patient completed 25-49% of tasks  FIM - Toileting Toileting steps completed by patient: Performs perineal hygiene Toileting Assistive Devices: Grab bar or rail for support Toileting: 2: Max-Patient completed 1 of 3 steps  FIM - Diplomatic Services operational officerToilet Transfers Toilet Transfers Assistive Devices: Grab bars Toilet Transfers: 3-From toilet/BSC: Mod A (lift or lower assist), 2-To toilet/BSC: Max A (lift and lower assist)  FIM - Press photographerBed/Chair Transfer Bed/Chair Transfer Assistive Devices: Arm rests, Bed rails Bed/Chair Transfer: 4: Supine > Sit: Min A (steadying Pt. > 75%/lift  1 leg), 3: Bed > Chair or W/C: Mod A (lift or lower  assist), 3: Chair or W/C > Bed: Mod A (lift or lower assist)  FIM - Locomotion: Wheelchair Distance: 150 Locomotion: Wheelchair: 4: Travels 150 ft or more: maneuvers on rugs and over door sillls with minimal assistance (Pt.>75%) FIM - Locomotion: Ambulation Locomotion: Ambulation Assistive Devices: Other (comment) (HHA) Ambulation/Gait Assistance: 1: +2 Total assist Locomotion: Ambulation: 1: Two helpers  Comprehension Comprehension Mode: Auditory Comprehension: 5-Understands basic 90% of the time/requires cueing < 10% of the time  Expression Expression Mode: Verbal Expression: 5-Expresses basic 90% of the time/requires cueing < 10% of the time.  Social Interaction Social Interaction: 6-Interacts appropriately with others with medication or extra time (anti-anxiety, antidepressant).  Problem Solving Problem Solving Mode: Not assessed Problem Solving: 4-Solves basic 75 - 89% of the time/requires cueing 10 - 24% of the time  Memory Memory: 4-Recognizes or recalls 75 - 89% of the time/requires cueing 10 - 24% of the time  Medical Problem List and Plan: 1. Functional deficits secondary to TBI after fall  2. DVT Prophylaxis/Anticoagulation: Mechanical: Sequential compression devices, below knee Bilateral lower extremities 3. Pain Management: Tylenol prn.  4. Mood: On Celexa daily. LCSW to follow for evaluation and support.  5. Neuropsych: This patient is capable of making decisions on her own behalf. 6. Skin/Wound Care: Routine pressure relief measures.7. Fluids/Electrolytes/Nutrition: Monitor I/O. Intake poor/borderline---ask RD to follow up  -check bmet/prealbumin today 8. HTN: Monitor BP every 8 hours. Has been labile and medications increased to bid. H/o orthostasis in the past--monitor for symptoms.  9. Dyslipidemia: Resume meds at discharge.  10. ABLA:  iron supplement. recheck labs today 11. PAF with h/o flash edema due to RVR: On amiodarone.  HR better controlled.   12. CAD: Has been managed medically without symptoms.  13. IBS: diet/supplements 14. Raynaud's phenomenon--keeping room warmer. No change in plan  LOS (Days) 13 A FACE TO FACE EVALUATION WAS PERFORMED  Claudette Laws E 07/04/2014 10:36 AM

## 2014-07-04 NOTE — Progress Notes (Signed)
Occupational Therapy Session Note  Patient Details  Name: [name] MRN: [id number] Date of Birth: January 22, 1921  Today's Date: 07/04/2014 OT Individual Time: 1400-1500 OT Individual Time Calculation (min): 60 min   Short Term Goals: Week 1:  OT Short Term Goal 1 (Week 1): Pt will complete UB dressing with Mod A OT Short Term Goal 1 - Progress (Week 1): Met OT Short Term Goal 2 (Week 1): Pt will complete LB dressing with Mod A OT Short Term Goal 2 - Progress (Week 1): Not met OT Short Term Goal 3 (Week 1): Pt will complete toileting routine with Mod A OT Short Term Goal 3 - Progress (Week 1): Not met OT Short Term Goal 4 (Week 1): Pt will complete toilet transfer with Mod A  OT Short Term Goal 4 - Progress (Week 1): Partly met OT Short Term Goal 5 (Week 1): Pt will complete functional activity in standing for 1 min with Min A OT Short Term Goal 5 - Progress (Week 1): Partly met   Week 2:  OT Short Term Goal 1 (Week 2): Pt will perform UB dressing with Min A. OT Short Term Goal 2 (Week 2): Pt will perform LB dressing with Mod A.  OT Short Term Goal 3 (Week 2): Pt will consistently perform functional transfer with Mod A.  OT Short Term Goal 4 (Week 2): Pt will complete functional activity in standing for 90 seconds with Mod A.   Skilled Therapeutic Interventions/Progress Updates:  Patient found in the gym post PT session. Pt with 4/10 complaints of pain in bilateral shoulders, pt reports "rest" helps relieve the pain. Assisted pt > ADL apartment kitchen for therapeutic activity focusing on LUE fine/gross motor activities, LUE reaching activities, dynamic standing balance/tolerance/endurance, and energy conservation prn. Pt ambulated <> kitchen (about 20 feet one way) and used stool for seated rest breaks. Therapist assisted pt back to room and pt transferred > bed. Left pt supine in bed with bed alarm set and all needs within reach.   Therapy Documentation Precautions:   Precautions Precautions: Fall Precaution Comments: L listing Restrictions Weight Bearing Restrictions: No  See FIM for current functional status  Therapy/Group: Individual Therapy  Estes Lehner , MS, OTR/L, CLT Pager: [phone]  07/04/2014, 3:26 PM

## 2014-07-04 NOTE — Progress Notes (Signed)
Occupational Therapy Session Note  Patient Details  Name: [name] MRN: [id number] Date of Birth: 04/01/1920  Today's Date: 07/04/2014 OT Individual Time: 1000-1100 OT Individual Time Calculation (min): 60 min    Short Term Goals: Week 2:  OT Short Term Goal 1 (Week 2): Pt will perform UB dressing with Min A. OT Short Term Goal 2 (Week 2): Pt will perform LB dressing with Mod A.  OT Short Term Goal 3 (Week 2): Pt will consistently perform functional transfer with Mod A.  OT Short Term Goal 4 (Week 2): Pt will complete functional activity in standing for 90 seconds with Mod A.   Skilled Therapeutic Interventions/Progress Updates:    Pt engaged in BADL retraining including bathing and dressing with sit<>stand from w/c at sink.  Pt requires more than reasonable amount of time to complete tasks with multiple rest breaks.  Pt completed bathing and dressing tasks with steady A for standing balance and min A for LB dressing.  Pt transitioned to LUE/hand activities to increased strength and coordination.  Focus on activity tolerance, sit<>stand, standing balance, LUE funciton, and safety awareness.  Therapy Documentation Precautions:  Precautions Precautions: Fall Precaution Comments: L listing Restrictions Weight Bearing Restrictions: No General:     Pain:  Pt denied pain   See FIM for current functional status  Therapy/Group: Individual Therapy  Rich BraveLanier, Leeon Makar Chappell 07/04/2014, 11:05 AM

## 2014-07-04 NOTE — Progress Notes (Signed)
Physical Therapy Session Note  Patient Details  Name: [name] MRN: [id number] Date of Birth: 14-Aug-1920  Today's Date: 07/04/2014 PT Individual Time: 1300-1400 PT Individual Time Calculation (min): 60 min   Short Term Goals: Week 1:  PT Short Term Goal 1 (Week 1): Patient will perform bed mobility with HOB flat with consistent min A.  PT Short Term Goal 1 - Progress (Week 1): Progressing toward goal PT Short Term Goal 2 (Week 1): Patient will transfer bed <>wheelchair with consistent min A.  PT Short Term Goal 2 - Progress (Week 1): Not met PT Short Term Goal 3 (Week 1): Patient will ambulate 150 ft using LRAD with min A. PT Short Term Goal 3 - Progress (Week 1): Not met PT Short Term Goal 4 (Week 1): Patient will maintain standing balance x 2 min during functional task with min A.  PT Short Term Goal 4 - Progress (Week 1): Not met PT Short Term Goal 5 (Week 1): Patient will negotiate up/down 4 stairs using 2 rails with min A.  PT Short Term Goal 5 - Progress (Week 1): Not met Week 2:  PT Short Term Goal 1 (Week 2): Patient will perform bed mobility with HOB flat with consistent min A.  PT Short Term Goal 2 (Week 2): Patient will transfer bed <>wheelchair with consistent mod A.  PT Short Term Goal 3 (Week 2): Patient will ambulate 150 ft using LRAD with mod A. PT Short Term Goal 4 (Week 2): Patient will maintain standing balance x 2 min during functional task with mod A.  PT Short Term Goal 5 (Week 2): Patient will negotiate up/down 4 stairs using 2 rails with mod A.   Skilled Therapeutic Interventions/Progress Updates:   Session focused on functional transfers with emphasis on technique and safety with cues for hand placement, overall activity tolerance and endurance, static standing balance and postural control in standing, normal TUG (with RW and L hand splint) performed (2 min 13 seconds), gait training with RW (and L hand splint) with cues for L heel strike to increase clearance and  steady A for balance multiple trials, and stair negotiation for functional strengthening and household mobility using B rails with steady A and cues for foot placement. Left up in w/c awaiting OT for next session.  Therapy Documentation Precautions:  Precautions Precautions: Fall Precaution Comments: L listing Restrictions Weight Bearing Restrictions: No Pain:  Denies pain.  See FIM for current functional status  Therapy/Group: Individual Therapy  Canary Brim Ivory Broad, PT, DPT  07/04/2014, 3:38 PM

## 2014-07-04 NOTE — Progress Notes (Signed)
Speech Language Pathology Daily Session Note  Patient Details  Name: [name] W Willetts MRN: [id number] Date of Birth: 07/07/1920  Today's Date: 07/04/2014 SLP Individual Time: [id number] SLP Individual Time Calculation (min): 45 min  Short Term Goals: Week 2: SLP Short Term Goal 1 (Week 2): Patient will attend to left field of enviornment/body during functional and familiar tasks with Min A verbal and question cues. SLP Short Term Goal 2 (Week 2): Patient will recall new, daily information with use of visual aids with supervision verbal and question cues.  SLP Short Term Goal 3 (Week 2): Patient will self-monitor and correct errors during functional and familiar tasks with supervision verbal and question cues.  SLP Short Term Goal 4 (Week 2): Patient will demonstrate functional problem solving for mildly complex tasks with supervision verbal and question cues.   Skilled Therapeutic Interventions: Skilled treatment session focused on cognitive goals. SLP facilitated session by providing supervision question cues for recall of rules to a mildly complex, new learning task. Patient was Mod I for problem solving with task. Patient also recalled events from the weekend with Mod I. Patient left upright in wheelchair with quick release belt in place and all needs within reach. Continue with current plan of care.    FIM:  Comprehension Comprehension Mode: Auditory Comprehension: 5-Understands complex 90% of the time/Cues < 10% of the time Expression Expression Mode: Verbal Expression: 6-Expresses complex ideas: With extra time/assistive device Social Interaction Social Interaction: 6-Interacts appropriately with others with medication or extra time (anti-anxiety, antidepressant). Problem Solving Problem Solving: 5-Solves complex 90% of the time/cues < 10% of the time Memory Memory: 5-Recognizes or recalls 90% of the time/requires cueing < 10% of the time FIM - Eating Eating Activity: 5: Set-up assist  for open containers  Pain Pain Assessment Pain Assessment: No/denies pain  Therapy/Group: Individual Therapy  Mazell Aylesworth 07/04/2014, 5:00 PM

## 2014-07-05 ENCOUNTER — Inpatient Hospital Stay (HOSPITAL_COMMUNITY): Payer: Medicare Other | Admitting: Speech Pathology

## 2014-07-05 ENCOUNTER — Inpatient Hospital Stay (HOSPITAL_COMMUNITY): Payer: Medicare Other | Admitting: Physical Therapy

## 2014-07-05 ENCOUNTER — Inpatient Hospital Stay (HOSPITAL_COMMUNITY): Payer: Medicare Other

## 2014-07-05 NOTE — Progress Notes (Signed)
Speech Language Pathology Daily Session Note  Patient Details  Name: [name] MRN: [id number] Date of Birth: 11/19/1920  Today's Date: 07/05/2014 SLP Individual Time: 1500-1530 SLP Individual Time Calculation (min): 30 min  Short Term Goals: Week 2: SLP Short Term Goal 1 (Week 2): Patient will attend to left field of enviornment/body during functional and familiar tasks with Min A verbal and question cues. SLP Short Term Goal 1 - Progress (Week 2): Progressing toward goal SLP Short Term Goal 2 (Week 2): Patient will recall new, daily information with use of visual aids with supervision verbal and question cues.  SLP Short Term Goal 2 - Progress (Week 2): Progressing toward goal SLP Short Term Goal 3 (Week 2): Patient will self-monitor and correct errors during functional and familiar tasks with supervision verbal and question cues.  SLP Short Term Goal 3 - Progress (Week 2): Progressing toward goal SLP Short Term Goal 4 (Week 2): Patient will demonstrate functional problem solving for mildly complex tasks with supervision verbal and question cues.  SLP Short Term Goal 4 - Progress (Week 2): Progressing toward goal  Skilled Therapeutic Interventions: Pt was seen for skilled ST treatment session focusing on cognitive goals. Upon arrival, pt was resting in bed, and reported being tired. She was, however, willing to participate in therapy, and moved to be seated at the edge of the bed with supervision cues. SLP facilitated session by providing MinA verbal and/or tactile cues to not use right hand during functional problem solving tasks of looking for specific article in a magazine, and cleaning off her shoes.  At the end of the session, increased leaning to the left was noted. Pt able to self correct with minA tactile cues.  Patient was repositioned to reclining in the bed, with family present and all needs within reach. Continue with current plan of care.   FIM:  Comprehension Comprehension  Mode: Auditory Comprehension: 5-Understands complex 90% of the time/Cues < 10% of the time Expression Expression Mode: Verbal Expression: 5-Expresses complex 90% of the time/cues < 10% of the time Social Interaction Social Interaction: 6-Interacts appropriately with others with medication or extra time (anti-anxiety, antidepressant). Problem Solving Problem Solving: 5-Solves basic 90% of the time/requires cueing < 10% of the time Memory Memory: 5-Recognizes or recalls 90% of the time/requires cueing < 10% of the time  Pain Pain Assessment Pain Assessment: No/denies pain Pain Score: 0-No pain  Therapy/Group: Individual Therapy   Viktoriya Glaspy B. DillsburgBue[place], [facility] General HospitalMSP, CCC-SLP [phone]  Leigh AuroraBueche, Xavius Spadafore Brown 07/05/2014, 4:23 PM

## 2014-07-05 NOTE — Progress Notes (Signed)
Occupational Therapy Weekly Progress Note  Patient Details  Name: [name] MRN: [id number] Date of Birth: July 21, 1920  Beginning of progress report period: Jun 28, 2014 End of progress report period: July 05, 2014  Today's Date: 07/05/2014 OT Individual Time: 1100-1200 OT Individual Time Calculation (min): 60 min    Patient has met 2 of 4 short term goals.  Pt has made progress during this reporting period. Patient currently requires min-mod A for functional transfers and min-mod A for sit<>stand during ADL tasks. Pt demonstrates improved midline orientation and postural control with verbal cues for upright posture. Pt demonstrates improved awareness and functional use of LUE during ADL routine.   Patient continues to demonstrate the following deficits: L hemiplegia, decreased postural control, decreased standing balance, decreased awareness, decreased strength, decreased coordination,decreased activity tolerance and therefore will continue to benefit from skilled OT intervention to enhance overall performance with BADL, improve postural control, balance, strength, and activity tolerance.  Patient progressing toward long term goals..  Continue plan of care.  OT Short Term Goals Week 1:  OT Short Term Goal 1 (Week 1): Pt will complete UB dressing with Mod A OT Short Term Goal 1 - Progress (Week 1): Met OT Short Term Goal 2 (Week 1): Pt will complete LB dressing with Mod A OT Short Term Goal 2 - Progress (Week 1): Not met OT Short Term Goal 3 (Week 1): Pt will complete toileting routine with Mod A OT Short Term Goal 3 - Progress (Week 1): Not met OT Short Term Goal 4 (Week 1): Pt will complete toilet transfer with Mod A  OT Short Term Goal 4 - Progress (Week 1): Partly met OT Short Term Goal 5 (Week 1): Pt will complete functional activity in standing for 1 min with Min A OT Short Term Goal 5 - Progress (Week 1): Partly met Week 2:  OT Short Term Goal 1 (Week 2): Pt will perform UB  dressing with Min A. OT Short Term Goal 1 - Progress (Week 2): Partly met OT Short Term Goal 2 (Week 2): Pt will perform LB dressing with Mod A.  OT Short Term Goal 2 - Progress (Week 2): Met OT Short Term Goal 3 (Week 2): Pt will consistently perform functional transfer with Mod A.  OT Short Term Goal 3 - Progress (Week 2): Met OT Short Term Goal 4 (Week 2): Pt will complete functional activity in standing for 90 seconds with Mod A.  OT Short Term Goal 4 - Progress (Week 2): Not met Week 3:  OT Short Term Goal 1 (Week 3): Focus on LTG  Skilled Therapeutic Interventions/Progress Updates:    Pt seen for ADL retraining session consisting of functional transfers, postural control, activity tolerance, safety awareness, standing balance, and LUE retraining. Pt completed B/D sit<>stand at min A 4x during session. Pt demonstrated increased functional use of LUE during bilateral tasks throughout B/D/G. Pt noted with improved upright posture in standing. Pt able to correct posture with verbal cues and therapist manual facilitation for anterior pelvic tilt. Pt completed toilet transfer via stand-pivot at min A.Therapist provided "soft" level of theraputty with beads for LUE FM task. Pt able to find 8/8 beads with increased tasks and mod cues for using LUE. Noted improved affect and engagement level throughout session. Pt left sitting in w/c with all needs in reach.   Therapy Documentation Precautions:  Precautions Precautions: Fall Precaution Comments: L listing Restrictions Weight Bearing Restrictions: No General:   Vital Signs:   Pain:  Pain Assessment Pain Assessment: No/denies pain ADL:    See FIM for current functional status  Therapy/Group: Individual Therapy  Dorann Ou 07/05/2014, 12:06 PM

## 2014-07-05 NOTE — Progress Notes (Signed)
Recreational Therapy Session Note  Patient Details  Name: [name] W Gossen MRN: [id number] Date of Birth: 11/30/1920 Today's Date: 07/05/2014  Pain: no c/o Skilled Therapeutic Interventions/Progress Updates: Session focused on leisure discussion at bedside with Mod I.  Pt performed bed mobility with supervision.  Pt noted to be more alert and conversational today than previous encounters.  Provided +2 assist for w/c follow during PT session for safety, pt requiring min assist, min cues for ambulation using RW ~163'. Everard Interrante 07/05/2014, 4:04 PM

## 2014-07-05 NOTE — Progress Notes (Signed)
[facility] PH[facility]ICAL [facility]IC[facility] & REHABILITATION     PROGRESS NOTE    Subjective/Complaints: Patient states she is tired from therapy but no other complaints.  Pt denies nausea, vomiting, abdominal pain, diarrhea, chest pain, shortness of breath,    Objective: Vital Signs: Blood pressure 112/69, pulse 85, temperature 98.1 F (36.7 C), temperature source Oral, resp. rate 17, height $RemoveBe'5\' 6"'[id number]$  (1.676 m), weight 46.1 kg (101 lb 10.1 oz), SpO2 98 %. No results found. No results for input(s): WBC, HGB, HCT, PLT in the last 72 hours. No results for input(s): NA, K, CL, GLUCOSE, BUN, CREATININE, CALCIUM in the last 72 hours.  Invalid input(s): CO CBG (last 3)  No results for input(s): GLUCAP in the last 72 hours.  Wt Readings from Last 3 Encounters:  06/29/14 46.1 kg (101 lb 10.1 oz)  06/16/14 50.6 kg (111 lb 8.8 oz)  06/07/14 52.164 kg (115 lb)    Physical Exam:  Constitutional: She is oriented to person, place, and time. She appears well-developed and well-nourished. No distress.  Thin elderly female. Fatigued appearing but NAD.  HENT:  Head: Normocephalic.   Eyes: Conjunctivae and EOM are normal. Pupils are equal, round, and reactive to light.  Neck: Normal range of motion. Neck supple. No JVD present. No tracheal deviation present. No thyromegaly present.  Cardiovascular: Normal rate. An irregularly irregular rhythm present.  Systolic murmur heard. Respiratory: Effort normal and breath sounds normal. No respiratory distress. She has no wheezes.  GI: Soft. Bowel sounds are normal. She exhibits no distension. There is no tenderness. There is no rebound.  Musculoskeletal: She exhibits no edema or tenderness.  Kyphosis noted.  Ext: distal digits cool Neurological: She is alert and oriented to person, place, and time.  Patient is oriented to person, place and date of birth. Remains very alert.  Good attention LUE and LLE 4/5 deltoid,bicep, tricep, wrist, HF, KE and ankle.   Skin:  Skin is warm and dry. She is not diaphoretic.  Psychiatric: She has a normal mood and affect.    Assessment/Plan: 1. Functional deficits secondary to TBI after fall which require 3+ hours per day of interdisciplinary therapy in a comprehensive inpatient rehab setting. Physiatrist is providing close team supervision and 24 hour management of active medical problems listed below. Physiatrist and rehab team continue to assess barriers to discharge/monitor patient progress toward functional and medical goals. Team conference today please see physician documentation under team conference tab, met with team face-to-face to discuss problems,progress, and goals. Formulized individual treatment plan based on medical history, underlying problem and comorbidities. FIM: FIM - Bathing Bathing Steps Patient Completed: Chest, Right upper leg, Left upper leg, Left Arm, Abdomen, Right lower leg (including foot), Left lower leg (including foot), Right Arm, Front perineal area, Buttocks Bathing: 4: Steadying assist  FIM - Upper Body Dressing/Undressing Upper body dressing/undressing steps patient completed: Thread/unthread right sleeve of pullover shirt/dresss, Thread/unthread left sleeve of pullover shirt/dress, Put head through opening of pull over shirt/dress, Pull shirt over trunk Upper body dressing/undressing: 5: Supervision: Safety issues/verbal cues FIM - Lower Body Dressing/Undressing Lower body dressing/undressing steps patient completed: Thread/unthread right underwear leg, Thread/unthread left underwear leg, Pull underwear up/down, Thread/unthread right pants leg, Thread/unthread left pants leg, Pull pants up/down, Don/Doff right shoe, Don/Doff left shoe Lower body dressing/undressing: 4: Min-Patient completed 75 plus % of tasks  FIM - Toileting Toileting steps completed by patient: Performs perineal hygiene Toileting Assistive Devices: Grab bar or rail for support Toileting: 2: Max-Patient  completed 1 of  3 steps  FIM - Radio producer Devices: Grab bars Toilet Transfers: 4-To toilet/BSC: Min A (steadying Pt. > 75%), 4-From toilet/BSC: Min A (steadying Pt. > 75%)  FIM - Control and instrumentation engineer Devices: Arm rests, Copy: 4: Bed > Chair or W/C: Min A (steadying Pt. > 75%), 4: Chair or W/C > Bed: Min A (steadying Pt. > 75%), 5: Supine > Sit: Supervision (verbal cues/safety issues), 5: Sit > Supine: Supervision (verbal cues/safety issues)  FIM - Locomotion: Wheelchair Distance: 150 Locomotion: Wheelchair: 1: Total Assistance/staff pushes wheelchair (Pt<25%) FIM - Locomotion: Ambulation Locomotion: Ambulation Assistive Devices: Walker - Rolling, Orthosis (L HO) Ambulation/Gait Assistance: 4: Min guard, 4: Min assist Locomotion: Ambulation: 1: Travels less than 50 ft with minimal assistance (Pt.>75%)  Comprehension Comprehension Mode: Auditory Comprehension: 5-Understands complex 90% of the time/Cues < 10% of the time  Expression Expression Mode: Verbal Expression: 5-Expresses complex 90% of the time/cues < 10% of the time  Social Interaction Social Interaction: 6-Interacts appropriately with others with medication or extra time (anti-anxiety, antidepressant).  Problem Solving Problem Solving Mode: Not assessed Problem Solving: 5-Solves complex 90% of the time/cues < 10% of the time  Memory Memory: 5-Recognizes or recalls 90% of the time/requires cueing < 10% of the time  Medical Problem List and Plan: 1. Functional deficits secondary to TBI after fall  2. DVT Prophylaxis/Anticoagulation: Mechanical: Sequential compression devices, below knee Bilateral lower extremities 3. Pain Management: Tylenol prn.  4. Mood: On Celexa daily. LCSW to follow for evaluation and support.  5. Neuropsych: This patient is capable of making decisions on her own behalf. 6. Skin/Wound Care: Routine pressure relief  measures.7. Fluids/Electrolytes/Nutrition: Monitor I/O. Intake poor/borderline---ask RD to follow up  8. HTN: Monitor BP every 8 hours. Has been labile and medications increased to bid. H/o orthostasis in the past--monitor for symptoms.  9. Dyslipidemia: Resume meds at discharge.  10. ABLA:  iron supplement. recheck labs today 11. PAF with h/o flash edema due to RVR: On amiodarone.  HR better controlled.  12. CAD: Has been managed medically without symptoms.  13. IBS: diet/supplements 14. Raynaud's phenomenon--keeping room warmer. No change in plan  LOS (Days) 14 A FACE TO FACE EVALUATION WAS PERFORMED  Alysia Penna E 07/05/2014 10:17 AM

## 2014-07-05 NOTE — Progress Notes (Signed)
Physical Therapy Session Note  Patient Details  Name: [name] MRN: [id number] Date of Birth: 06/01/1920  Today's Date: 07/05/2014 PT Concurrent Time: 0800-0900  PT Concurrent Time Calculation (min): 60 min  Short Term Goals: Week 2:  PT Short Term Goal 1 (Week 2): Patient will perform bed mobility with HOB flat with consistent min A.  PT Short Term Goal 2 (Week 2): Patient will transfer bed <>wheelchair with consistent mod A.  PT Short Term Goal 3 (Week 2): Patient will ambulate 150 ft using LRAD with mod A. PT Short Term Goal 4 (Week 2): Patient will maintain standing balance x 2 min during functional task with mod A.  PT Short Term Goal 5 (Week 2): Patient will negotiate up/down 4 stairs using 2 rails with mod A.   Skilled Therapeutic Interventions/Progress Updates:   Session focused on midline orientation, L NMR, and postural control with functional mobility and overall activity tolerance. Patient sitting in wheelchair finishing breakfast, requesting to use bathroom. Performed stand pivot transfer wheelchair <> commode with mod A and cues for safe hand placement and sequencing, required assist for clothing management and able to perform hygiene with supervision. Patient performed hand hygiene and brushed hair from wheelchair level with assist to safely incorporate LUE. Gait training using RW with left hand orthosis 2 x 30 ft and 2 x 20 ft throughout session, min guard-min A overall with cues for safe hand placement with sit <> stand transfers and L heel strike to facilitate improved L foot clearance. Patient demonstrated greatly improved midline orientation, LLE advancement, and improved tolerance to ambulation. Active rest breaks in unsupported sitting with noted improvement in postural control and upright posture with decreased lateral lean/LOB to left. In standing, performed step taps using RLE only to 4" step with mod-max A for standing balance and therapist facilitating L knee extension  and forward weight shift due to posterior lean, mod multimodal cues for upright posture. Practiced bed mobility to R on mat table with supervision and cues for pushing with RUE to elevate trunk. Performed NuStep using BLE only at level 3 x 5 min for L NMR, sustained activation, and timing and sequencing. Patient left sitting in wheelchair with quick release belt on and all needs within reach.    Therapy Documentation Precautions:  Precautions Precautions: Fall Precaution Comments: L listing Restrictions Weight Bearing Restrictions: No Pain: Pain Assessment Pain Assessment: No/denies pain Locomotion : Ambulation Ambulation/Gait Assistance: 4: Min guard;4: Min assist   See FIM for current functional status  Therapy/Group: Concurrent   Bayard HuggerVarner, Madge Therrien A 07/05/2014, 9:03 AM

## 2014-07-05 NOTE — Progress Notes (Signed)
Physical Therapy Session Note  Patient Details  Name: [name] MRN: [id number] Date of Birth: 08/29/1920  Today's Date: 07/05/2014 PT Individual Time: 1350-1445 PT Individual Time Calculation (min): 55 min   Short Term Goals: Week 2:  PT Short Term Goal 1 (Week 2): Patient will perform bed mobility with HOB flat with consistent min A.  PT Short Term Goal 2 (Week 2): Patient will transfer bed <>wheelchair with consistent mod A.  PT Short Term Goal 3 (Week 2): Patient will ambulate 150 ft using LRAD with mod A. PT Short Term Goal 4 (Week 2): Patient will maintain standing balance x 2 min during functional task with mod A.  PT Short Term Goal 5 (Week 2): Patient will negotiate up/down 4 stairs using 2 rails with mod A.   Skilled Therapeutic Interventions/Progress Updates:   Session focused on functional ambulation, stair negotiation, safety with sit <> stand transfers, car transfers, and L NMR. Patient received in bed with recreational therapist present. Patient transferred supine > sit with HOB flat and supervision with increased time and sit > stand from bed with supervision and verbal cues for technique. Gait using RW with L hand orthosis x 170 ft with min guard and verbal cues for attention to task and L heel strike for improved L foot clearance, +2 for wheelchair follow for safety. Stair training up/down eight 6-inch steps using 2 rails with min guard, min verbal cues for step-to sequencing and attending to/advancing LUE forward along rail. Patient required verbal cues for safe hand placement and to ensure LLE under patient for forward translation of weight with sit > stand. Performed simulated car transfer to SUV height using RW with L hand orthosis, min A overall and max cues for sequencing and attention to L side. Patient negotiated up/down small ramp and curb step using RW with min A overall, max cues for LLE clearance and assist for lifting/lowering RW due to LUE weakness. Patient propelled  wheelchair using BLE for L NMR with focus on L foot clearance via heel strike. With BUE support on rail in hallway, performed side stepping to R and L for hip abduction strengthening x 30 ft each direction, cues for maintaining upright posture and keeping toes forward. Patient returned to bed via squat pivot transfer min A and left semi reclined in bed with bed alarm on and all needs within reach. Patient demonstrated overall improved alertness and improved activity tolerance this date compared to last week.   Therapy Documentation Precautions:  Precautions Precautions: Fall Precaution Comments: L listing Restrictions Weight Bearing Restrictions: No Pain: Pain Assessment Pain Assessment: No/denies pain  See FIM for current functional status  Therapy/Group: Individual Therapy  Kerney ElbeVarner, [place] A 07/05/2014, 3:05 PM

## 2014-07-06 ENCOUNTER — Inpatient Hospital Stay (HOSPITAL_COMMUNITY): Payer: Medicare Other | Admitting: Occupational Therapy

## 2014-07-06 ENCOUNTER — Inpatient Hospital Stay (HOSPITAL_COMMUNITY): Payer: Medicare Other | Admitting: Speech Pathology

## 2014-07-06 ENCOUNTER — Inpatient Hospital Stay (HOSPITAL_COMMUNITY): Payer: Medicare Other | Admitting: Physical Therapy

## 2014-07-06 ENCOUNTER — Inpatient Hospital Stay (HOSPITAL_COMMUNITY): Payer: Medicare Other

## 2014-07-06 MED ORDER — ACETAMINOPHEN 325 MG PO TABS: 325.0000 mg | ORAL_TABLET | ORAL | Status: DC | PRN

## 2014-07-06 MED ORDER — TRAZODONE HCL 50 MG PO TABS: 25.0000 mg | ORAL_TABLET | Freq: Every evening | ORAL | Status: DC | PRN

## 2014-07-06 MED ORDER — PANTOPRAZOLE SODIUM 40 MG PO TBEC: 40.0000 mg | DELAYED_RELEASE_TABLET | Freq: Every day | ORAL | Status: AC

## 2014-07-06 MED ORDER — ALUM & MAG HYDROXIDE-SIMETH 200-200-20 MG/5ML PO SUSP: 30.0000 mL | ORAL | Status: DC | PRN

## 2014-07-06 MED ORDER — ENSURE ENLIVE PO LIQD: 237.0000 mL | Freq: Two times a day (BID) | ORAL | Status: DC

## 2014-07-06 NOTE — Progress Notes (Signed)
Occupational Therapy Session Note  Patient Details  Name: [name] MRN: [id number] Date of Birth: 05/18/1920  Today's Date: 07/06/2014 OT Individual Time: 1101-1200 OT Individual Time Calculation (min): 59 min    Short Term Goals: Week 3:  OT Short Term Goal 1 (Week 3): Focus on LTG  Skilled Therapeutic Interventions/Progress Updates:    Pt seen for ADL retraining with focus on functional mobility, sit<>stand, functional transfers, functional use of LUE, attention to L, and activity tolerance. Pt received supine in bed agreeable to bathe at shower level. Pt ambulated bed>bathroom with min-SBA using RW and min cues for attention to objects on L side. Completed bathing with min A for sit<>stand and supervision for standing balance with use of grab bars. Pt initiated use of LUE to complete 20% of bathing and when wringing out wash cloth. Pt required min A for UB dressing to doff shirt overhead, however able to don with increased time. Pt required min cues for hemi dressing technique for LB dressing with min A sit<>stand and SBA for standing balance. Completed L NMR activities in standing then sitting d/t fatigue. Pt placed connect four tokens in slot with min cues for visually attending to LUE during activity as she dropped 4 tokens. Increased challenge of activity in sitting by elevating table to facilitate holding shoulder flexion to grossly 80* during activity. At end of session pt returned to room and left with all needs in reach.   Therapy Documentation Precautions:  Precautions Precautions: Fall Precaution Comments: L listing Restrictions Weight Bearing Restrictions: No General:   Vital Signs:   Pain: Pain Assessment Pain Assessment: No/denies pain Pain Score: 0-No pain  See FIM for current functional status  Therapy/Group: Individual Therapy  Daneil Danerkinson, Amit Meloy N 07/06/2014, 12:10 PM

## 2014-07-06 NOTE — Progress Notes (Signed)
Report called to Morrie SheldonAshley, RN at [facility] [place]. Patient discharged to [facility]. Patient transported by daughter with belongings and paperwork.

## 2014-07-06 NOTE — Progress Notes (Signed)
Social Work Discharge Note  The overall goal for the admission was met for:   Discharge location: No - plan changed to SNF  Length of Stay: Yes - 15 days  Discharge activity level: Yes - supervision/ min assist  Home/community participation: Yes  Services provided included: MD, RD, PT, OT, SLP, RN, TR, Pharmacy and SW  Financial Services: Medicare  Follow-up services arranged: Other: SNF placement at [facility] [place]  Comments (or additional information):  Patient/Family verbalized understanding of follow-up arrangements: Yes  Individual responsible for coordination of the follow-up plan: pt  Confirmed correct DME delivered: NA  Avory Rahimi

## 2014-07-06 NOTE — Progress Notes (Signed)
Occupational Therapy Discharge Summary  Patient Details  Name: [name] MRN: [id number] Date of Birth: 1920/05/16  Today's Date: 07/06/2014     Patient has met 9 of 10 long term goals due to improved activity tolerance, improved balance, postural control, ability to compensate for deficits, functional use of  LEFT upper extremity, improved attention and improved awareness.  Patient to discharge at [facility] Assist level.  Patient's care partner is independent and not necessary to provide the physical assistance at discharge secondary to discharge at [facility].   Reasons goals not met: Pt performs UB dressing inconsistently between min A and supervision level.   Recommendation:  Patient will benefit from ongoing skilled OT services in skilled nursing facility setting to continue to advance functional skills in the area of BADL.  Equipment: No equipment provided  Reasons for discharge: treatment goals met and discharge from hospital  Patient/family agrees with progress made and goals achieved: Yes  OT Discharge Precautions/Restrictions  Precautions Precautions: Fall Restrictions Weight Bearing Restrictions: No General   Vital Signs   Pain Pain Assessment Pain Assessment: No/denies pain ADL   Vision/Perception  Vision- History Baseline Vision/History: Wears glasses Wears Glasses: At all times Patient Visual Report: No change from baseline  Cognition Overall Cognitive Status: Impaired/Different from baseline Arousal/Alertness: Awake/alert Orientation Level: Oriented X4 Attention: Selective Sustained Attention: Appears intact Selective Attention: Appears intact Memory: Impaired Memory Impairment: Decreased recall of new information Awareness: Impaired Awareness Impairment: Emergent impairment Problem Solving: Impaired Problem Solving Impairment: Functional complex Safety/Judgment: Appears intact [place] [employer] Scales of Cognitive Functioning:  Purposeful/appropriate Sensation Sensation Light Touch: Impaired Detail Light Touch Impaired Details: Impaired LLE;Impaired LUE Proprioception Impaired Details: Impaired LUE;Impaired LLE Coordination Gross Motor Movements are Fluid and Coordinated: No Fine Motor Movements are Fluid and Coordinated: No Coordination and Movement Description: L hemiplegia and decreased proprioception LLE  Motor  Motor Motor: Hemiplegia;Abnormal postural alignment and control Motor - Skilled Clinical Observations: L hemiplegia and L lateral lean  Mobility  Bed Mobility Bed Mobility: Supine to Sit Supine to Sit: 5: Supervision Transfers Transfers: Sit to Stand;Stand to Sit Sit to Stand: 4: Min assist Stand to Sit: 4: Min assist  Trunk/Postural Assessment  Cervical Assessment Cervical Assessment: Exceptions to [facility] (forward head lean ) Thoracic Assessment Thoracic Assessment: Exceptions to [facility] (kyphotic posture and rounded shoulders ) Lumbar Assessment Lumbar Assessment: Exceptions to [facility] (slight posterior pelvic tilt, can correct with cues)  Balance Balance Balance Assessed: Yes Dynamic Sitting Balance Dynamic Sitting - Balance Support: Feet supported;During functional activity Dynamic Sitting - Level of Assistance: 5: Stand by assistance Static Standing Balance Static Standing - Balance Support: Bilateral upper extremity supported;During functional activity Static Standing - Level of Assistance: 4: Min assist Dynamic Standing Balance Dynamic Standing - Balance Support: During functional activity Dynamic Standing - Level of Assistance: 4: Min assist Extremity/Trunk Assessment RUE Assessment RUE Assessment: Within Functional Limits LUE Assessment LUE Assessment: Exceptions to [facility] LUE AROM (degrees) LUE Overall AROM Comments: shoulder flexion 0-90 LUE Strength LUE Overall Strength Comments: overall 3+/5  See FIM for current functional status  Dorann Ou 07/06/2014, 4:48 PM

## 2014-07-06 NOTE — Progress Notes (Signed)
Speech Language Pathology Session Note & Discharge Summary  Patient Details  Name: [name] MRN: [id number] Date of Birth: 03-21-1920  Today's Date: 07/06/2014 SLP Individual Time: 1400-1435 SLP Individual Time Calculation (min): 35 min   Skilled Therapeutic Interventions:  Skilled treatment session focused on cognitive goals. SLP facilitated session by re-administering the MoCA. Patient scored a 24/30 points with a score of 26 or above considered normal. Patient's score has improved compared to the first time she was administered the MoCA on 06/22/14 when she received 19/30 points. Patient continues to demonstrate deficits in the areas of recall of new information and short-term memory. Patient's daughter present throughout session and educated on patient's current cognitive function and goals of f/u SLP intervention. Patient's daughter verbalized understanding of all information. Patient will discharge to a SNF today.   Patient has met 3 of 3 long term goals.  Patient to discharge at overall Supervision level.   Reasons goals not met: N/A   Clinical Impression/Discharge Summary: Patient has made functional gains and has met 3 of 3 LTG's this admission due to increased recall of functional information, functional problem solving and emergent awareness. Currently, patient is demonstrating behaviors consistent with a Rancho Level VIII and requires overall supervision verbal cues for functional problem solving with basic and familiar tasks, recall of new information with use of visual aids and emergent awareness of errors. Patient's family is unable to provide the necessary physical and cognitive assistance needed at this time, therefore, patient will discharge to a SNF.  Patient and family education is complete for this venue of care and patient would benefit from continued skilled SLP intervention at the SNF to maximize her cognitive function and overall functional independence in order to reduce  caregiver burden.   Care Partner:  Caregiver Able to Provide Assistance: No  Type of Caregiver Assistance: Physical;Cognitive  Recommendation:  24 hour supervision/assistance;Skilled Nursing facility  Rationale for SLP Follow Up: Maximize cognitive function and independence;Reduce caregiver burden   Equipment: N/A   Reasons for discharge: Treatment goals met;Discharged from hospital   Patient/Family Agrees with Progress Made and Goals Achieved: Yes   See FIM for current functional status  [name] 07/06/2014, 2:45 PM

## 2014-07-06 NOTE — Progress Notes (Signed)
Physical Therapy Progress Note  Patient Details  Name: [name] MRN: [id number] Date of Birth: 1920-05-14  Today's Date: 07/06/2014 PT Individual Time: 0930-1030 PT Individual Time Calculation (min): 60 min   PT Short Term Goals Week 2:  PT Short Term Goal 1 (Week 2): Patient will perform bed mobility with HOB flat with consistent min A.  PT Short Term Goal 1 - Progress (Week 2): Met PT Short Term Goal 2 (Week 2): Patient will transfer bed <>wheelchair with consistent mod A.  PT Short Term Goal 2 - Progress (Week 2): Met PT Short Term Goal 3 (Week 2): Patient will ambulate 150 ft using LRAD with mod A. PT Short Term Goal 3 - Progress (Week 2): Met PT Short Term Goal 4 (Week 2): Patient will maintain standing balance x 2 min during functional task with mod A.  PT Short Term Goal 4 - Progress (Week 2): Progressing toward goal PT Short Term Goal 5 (Week 2): Patient will negotiate up/down 4 stairs using 2 rails with mod A.  PT Short Term Goal 5 - Progress (Week 2): Met Week 3:  PT Short Term Goal 1 (Week 3): = LTGs due to anticipated LOS  Skilled Therapeutic Interventions/Progress Updates:   Session focused on functional ambulation, transfers, LLE NMR, safety, and activity tolerance. Patient sitting in wheelchair, donned socks and shoes with extra time and assist for tying laces. Patient propelled wheelchair using BLE x 100 ft with supervision and cues for L heel strike due to shuffling foot along floor. In dayroom, gait training in home environment including turns using RW with left hand orthosis x 75 ft with min guard and verbal cues for L heel strike progressed to x 75 ft with supervision. Patient required verbal cues for reaching back using BUE and widening BOS with stand > sit  but able to complete transfer with supervision from wheelchair. Performed tall kneeling with small bench in front with verbal/visual cues for trunk/hip extension to neutral and to "squeeze" glutes with progression to  squats in tall kneeling x 5. Activity ended due to increased knee pain/pressure. Performed squat pivot transfer training wheelchair <> level mat table x 2 with overall supervision. Patient required cues to recall moving arm rest out of way but able to complete transfer with arm rest in place as well. Patient propelled wheelchair using BLE with supervision back to room and returned to bed via squat pivot transfer with min A and sit > supine and repositioned in bed with supervision. Patient left semi reclined in bed with all needs within reach.   Therapy Documentation Precautions:  Precautions Precautions: Fall Precaution Comments: L listing Restrictions Weight Bearing Restrictions: No Pain: Pain Assessment Pain Assessment: No/denies pain  See FIM for current functional status  Therapy/Group: Individual Therapy  Laretta Alstrom 07/06/2014, 10:10 AM

## 2014-07-06 NOTE — Progress Notes (Signed)
Occupational Therapy Session Note  Patient Details  Name: [name] MRN: [id number] Date of Birth: 07/05/1920  Today's Date: 07/06/2014 OT Individual Time: 1330-1400 OT Individual Time Calculation (min): 30 min    Skilled Therapeutic Interventions/Progress Updates:    1:1 Focus on safe functional ambulation with RW around room with VC for RW safety and positioning during a tasks. Ambulated to bathroom with close supervision and performed toileting with distant supervision with VC for safety. Pt ambulated back to the sink and able to wash hands with demonstrating increased attention to left hand automatically. Pt ambulated longer distance to the dayroom.  VC for proper positioning in prep for sitting in regular chair. Engaged in cognitive task focused on memory (working and short term), using memory strategies for game, generation of names of items/ thoughts, turn taking and processing speed.    Therapy Documentation Precautions:  Precautions Precautions: Fall Precaution Comments: L listing Restrictions Weight Bearing Restrictions: No Pain: Pain Assessment Pain Assessment: No/denies pain Pain Score: 0-No pain  See FIM for current functional status  Therapy/Group: Individual Therapy  Roney MansSmith, Johnna Bollier [facility] 07/06/2014, 7:59 PM

## 2014-07-06 NOTE — Progress Notes (Signed)
Social Work Patient ID: April SkeetersEula W Villegas, female   DOB: 05/08/1920, 81 y.o.   MRN: [id number]   Received SNF bed offer today from [facility] [place] and they are wanting to admit pt this afternoon. Have reviewed with MD and tx team - all cleared for transfer.  Daughter has already completed paperwork and pt / daughter agreeable with plan to transfer via car this afternoon.  Loretta Doutt, LCSW

## 2014-07-06 NOTE — Plan of Care (Signed)
Problem: RH Dressing Goal: LTG Patient will perform upper body dressing (OT) LTG Patient will perform upper body dressing with assist, with/without cues (OT).  Outcome: Not Met (add Reason) Pt inconsistent with performance.

## 2014-07-06 NOTE — Progress Notes (Signed)
Physical Therapy Discharge Summary  Patient Details  Name: [name] MRN: [id number] Date of Birth: 04/16/1920  Patient has met 11 of 11 long term goals due to improved activity tolerance, improved balance, improved postural control, increased strength, increased range of motion, ability to compensate for deficits, functional use of  left upper extremity and left lower extremity, improved attention, improved awareness and improved coordination.  Patient to discharge at a wheelchair level [place]. Patient currently demonstrating behaviors consistent with [place] Level [place]. Patient to discharge to SNF.   Reasons goals not met: NA  Recommendation:  Patient will benefit from ongoing skilled PT services in skilled nursing facility setting to continue to advance safe functional mobility, address ongoing impairments in L hemiplegia, postural control, muscle weakness, decreased cardiorespiratory endurance, impaired timing and sequencing, unbalanced muscle activation, decreased coordination, decreased midline orientation, decreased attention to left, and decreased memory and minimize fall risk.  Equipment: No equipment provided  Reasons for discharge: treatment goals met and discharge from hospital  Patient/family agrees with progress made and goals achieved: Yes  PT Discharge Precautions/Restrictions Precautions Precautions: Fall Restrictions Weight Bearing Restrictions: No Pain Pain Assessment Pain Assessment: No/denies pain Pain Score: 0-No pain Vision/Perception   No changes from baseline, improved L inattention Cognition Overall Cognitive Status: Impaired/Different from baseline Arousal/Alertness: Awake/alert Orientation Level: Oriented X4 Attention: Selective Sustained Attention: Appears intact Selective Attention: Appears intact Memory: Impaired Memory Impairment: Decreased recall of new information Awareness: Impaired Awareness Impairment: Emergent impairment Problem  Solving: Impaired Problem Solving Impairment: Functional complex Safety/Judgment: Appears intact [place] [employer] Scales of Cognitive Functioning: Purposeful/appropriate Sensation Sensation Light Touch: Impaired Detail Light Touch Impaired Details: Impaired LLE;Impaired LUE Proprioception Impaired Details: Impaired LUE;Impaired LLE Coordination Gross Motor Movements are Fluid and Coordinated: No Fine Motor Movements are Fluid and Coordinated: No Coordination and Movement Description: L hemiplegia and decreased proprioception LLE  Motor  Motor Motor: Hemiplegia;Abnormal postural alignment and control Motor - Skilled Clinical Observations: L hemiplegia   Mobility Bed Mobility Bed Mobility: Supine to Sit;Sit to Supine Supine to Sit: 5: Supervision;HOB flat Supine to Sit Details: Verbal cues for technique Sit to Supine: 5: Supervision;HOB flat Sit to Supine - Details: Verbal cues for technique Transfers Transfers: Yes Sit to Stand: 5: Supervision;With upper extremity assist;From bed;From chair/3-in-1 Sit to Stand Details: Verbal cues for technique;Verbal cues for safe use of DME/AE Stand to Sit: 5: Supervision;To bed;With upper extremity assist;To chair/3-in-1 Stand to Sit Details (indicate cue type and reason): Verbal cues for technique;Verbal cues for safe use of DME/AE Locomotion  Ambulation Ambulation: Yes Ambulation/Gait Assistance: 4: Min guard;5: Supervision Ambulation Distance (Feet): 150 Feet Assistive device: Rolling walker (with L hand orthosis) Ambulation/Gait Assistance Details: Verbal cues for technique Gait Gait: Yes Gait Pattern: Impaired Gait Pattern: Step-through pattern;Decreased dorsiflexion - left;Decreased weight shift to right;Left flexed knee in stance;Narrow base of support;Trunk flexed;Poor foot clearance - left Gait velocity: decreased Stairs / Additional Locomotion Stairs: Yes Stairs Assistance: 4: Min guard Stair Management Technique: Two  rails;Step to pattern;Forwards Number of Stairs: 8 Height of Stairs: 6 Ramp: 4: Min assist Curb: 4: Min Chemical engineer: Yes Wheelchair Assistance: 5: Careers information officer: Both lower extermities Wheelchair Parts Management: Supervision/cueing Distance: 150 ft  Trunk/Postural Assessment  Cervical Assessment Cervical Assessment: Exceptions to [facility] (forward ) Thoracic Assessment Thoracic Assessment: Exceptions to [facility] (kyphotic posture and rounded shoulders ) Lumbar Assessment Lumbar Assessment: Exceptions to WFL (slight posterior pelvic tilt, can correct with cues) Postural Control Postural Control: Deficits on  evaluation Protective Responses: delayed  Balance Balance Balance Assessed: Yes Dynamic Sitting Balance Dynamic Sitting - Balance Support: Feet supported;During functional activity Dynamic Sitting - Level of Assistance: 5: Stand by assistance Static Standing Balance Static Standing - Balance Support: Bilateral upper extremity supported;During functional activity Static Standing - Level of Assistance: 5: Stand by assistance Dynamic Standing Balance Dynamic Standing - Balance Support: During functional activity;Bilateral upper extremity supported Dynamic Standing - Level of Assistance: 4: Min assist Extremity Assessment  RUE Assessment RUE Assessment: Within Functional Limits LUE Assessment LUE Assessment: Exceptions to WFL LUE AROM (degrees) LUE Overall AROM Comments: shoulder flexion 0-90 LUE Strength LUE Overall Strength Comments: overall 3+/5 RLE Assessment RLE Assessment: Within Functional Limits LLE Assessment LLE Assessment: Exceptions to [facility] LLE Strength LLE Overall Strength: Deficits LLE Overall Strength Comments: grossly 4- to 4/5 throughout  See FIM for current functional status  Laretta Alstrom 07/06/2014, 5:47 PM

## 2014-07-06 NOTE — Patient Care Conference (Signed)
Inpatient RehabilitationTeam Conference and Plan of Care Update Date: 07/05/2014   Time: 3:20 PM    Patient Name: [name]      Medical Record Number: [id number]  Date of Birth: 08/31/1920 Sex: Female         Room/Bed: 4W09C/4W09C-01 Payor Info: Payor: [employer] copywriter[employer]ALTHCARE MEDICARE / [facility] / Product Type: *No Product type* /    Admitting Diagnosis: Traumatic SDH with ICH  Admit Date/Time:  06/21/2014  3:08 PM Admission Comments: No comment available   Primary Diagnosis:  Mild TBI Principal Problem: Mild TBI  Patient Active Problem List   Diagnosis Date Noted  . Mild TBI 06/21/2014  . Traumatic brain injury   . SDH (subdural hematoma) 06/16/2014  . Nontraumatic hemorrhage of cerebral hemisphere   . Hypertensive urgency   . CAD (coronary artery disease) of artery bypass graft 06/15/2012  . Carotid bruit present 06/15/2012  . Raynaud phenomenon 02/08/2011  . PAF (paroxysmal atrial fibrillation) 10/09/2010  . High risk medication use 10/09/2010  . Aortic valve disease 10/09/2010  . HYPERLIPIDEMIA 06/10/2007  . HYPERTENSION 06/10/2007  . ALLERGIC RHINITIS 06/10/2007  . EMPHYSEMA 06/10/2007  . DYSPNEA 06/10/2007  . COUGH 06/10/2007    Expected Discharge Date: Expected Discharge Date:  (SNF)  Team Members Present: Physician leading conference: Dr. Claudette LawsAndrew Kirsteins Social Worker Present: Amada JupiterLucy Najwa Spillane, LCSW Nurse Present: Carmie EndAngie Joyce, RN PT Present: Bayard Huggerebecca Varner, PT OT Present: Ardis Rowanom Lanier, Darolyn RuaOTA;Kayla Perkinson, OT SLP Present: Feliberto Gottronourtney Payne, SLP PPS Coordinator present : Tora DuckMarie Noel, RN, CRRN     Current Status/Progress Goal Weekly Team Focus  Medical   doing much better with mental status and endurance  maximiaze functional mobility, maintain  adequate o2 levels  cont rehab   Bowel/Bladder   continent of bowel and bladder, LBM 6/3  supervision.  manage bowel and bladder with min assist   Swallow/Nutrition/ Hydration             ADL's   min-mod A for  functional transfers and ADLs  may upgrade goals   functional transfers, standing balance, activity tolerance, postural control, LUE, L NMR, safety awareness, patient education    Mobility   min guard overall for short distance gait using RW with L hand orthosis, transfers, and stairs, supervision bed mobility, max cues for safety, sequencing, attending to L  min A overall household ambulator  functional ambulation, midline orientaiton, L attention, postural control, standing balance, safety awareness, activity tolerance, pt/family education    Communication             Safety/Cognition/ Behavioral Observations  Supervision   Supervision   recall, mildly complex problem solving, awareness,    Pain   no c/o pain  pain less than 3  assess pain q 4 hr and medicate as needed   Skin   no skin issues at this time  remain free of breakdown and infection while on rehab  assess skin q shift    Rehab Goals Patient on target to meet rehab goals: No *See Care Plan and progress notes for long and short-term goals.  Barriers to Discharge: endurance    Possible Resolutions to Barriers:  Home vs SNF    Discharge Planning/Teaching Needs:  plan has changed to SNF      Team Discussion:  Significant gains the past couple of days.  Currently min/ guard with amb 160'.  Cognition also much improved.  SW reports d/c plan still for SNF.  Revisions to Treatment Plan:  Change in  d/c plan to SNF   Continued Need for Acute Rehabilitation Level of Care: The patient requires daily medical management by a physician with specialized training in physical medicine and rehabilitation for the following conditions: Daily direction of a multidisciplinary physical rehabilitation program to ensure safe treatment while eliciting the highest outcome that is of practical value to the patient.: Yes Daily medical management of patient stability for increased activity during participation in an intensive rehabilitation  regime.: Yes Daily analysis of laboratory values and/or radiology reports with any subsequent need for medication adjustment of medical intervention for : Neurological problems;Other  Jerzie Bieri 07/06/2014, 8:49 AM

## 2014-07-06 NOTE — Discharge Summary (Signed)
Physician Discharge Summary  Patient ID: [name] MRN: [id number] DOB/AGE: 1920-03-11 81 y.o.  Admit date: 06/21/2014 Discharge date: 07/06/2014  Discharge Diagnoses:  Principal Problem:   Mild TBI Active Problems:   DYSPNEA   PAF (paroxysmal atrial fibrillation)   Raynaud phenomenon   CAD (coronary artery disease) of artery bypass graft   Discharged Condition: stable  Significant Diagnostic Studies: Dg Chest 2 View  06/24/2014   CLINICAL DATA:  Hypoxia.  Fall 1 week ago.  EXAM: CHEST  2 VIEW  COMPARISON:  06/17/2014 and 10/04/2008  FINDINGS: Lungs are hyperexpanded demonstrate stable upper lobe/ biapical pleural parenchymal calcification. No focal lobar consolidation or effusion. Borderline stable cardiomegaly. Mild calcified plaque over the aorta. Degenerative changes of the spine.  IMPRESSION: No acute cardiopulmonary disease.  COPD and stable pleural parenchymal calcification over the upper lobe/ apices.   Electronically Signed   By: Elberta Fortis M.D.   On: 06/24/2014 10:13   Chest Port 1 View  06/17/2014   CLINICAL DATA:  Routine physical examination; history of previous subdural hemorrhage ; emphysema, atrial fibrillation, and pulmonary edema.  EXAM: PORTABLE CHEST - 1 VIEW  COMPARISON:  Portable chest x-ray of Jun 16, 2014  FINDINGS: The lungs remain hyperinflated. Biapical scarring is unchanged. There is no alveolar infiltrate or pleural effusion. The cardiac silhouette is mildly enlarged but stable. The pulmonary vascularity is normal. The bony thorax exhibits no acute abnormality.  IMPRESSION: COPD with biapical parenchymal scarring likely secondary to previous granulomatous infection. There is no acute cardiopulmonary abnormality.   Electronically Signed   By: David  [place] M.D.   On: 06/17/2014 07:55     Labs:  Basic Metabolic Panel:  Recent Labs Lab 07/01/14 0955  NA 136  K 4.4  CL 101  CO2 26  GLUCOSE 76  BUN 18  CREATININE 0.70  CALCIUM 8.9    CBC: CBC  Latest Ref Rng 07/01/2014 06/22/2014 06/19/2014  WBC 4.0 - 10.5 K/uL 8.1 7.8 9.9  Hemoglobin 12.0 - 15.0 g/dL 11.2(L) 10.4(L) 9.3(L)  Hematocrit 36.0 - 46.0 % 35.5(L) 32.2(L) 29.2(L)  Platelets 150 - 400 K/uL 353 247 184    Today's Vitals   07/05/14 1456 07/05/14 1615 07/06/14 0421 07/06/14 0900  BP: 117/44  135/68   Pulse: 70  72   Temp: 97.6 F (36.4 C)  97.9 F (36.6 C)   TempSrc: Oral  Oral   Resp: 18  16   Height:      Weight:      SpO2: 99%  100%   PainSc:  0-No pain  0-No pain     CBG: No results for input(s): GLUCAP in the last 168 hours.   Brief HPI:   [name] is an 81 y.o. Female with history of CAD, HTN, Afib who was admitted on 06/16/14 after a fall while going down stairs. No LOC but patient with posterior scalp laceration and left facial droop noted by family. She was evaluated in ED and found to have right thalamic hemorrhage with additional small hemorrhagic foci around right thalamic hematoma, parafalcine hematoma, trace SAH along right falx and small SDH left temporal lobe. Serial CCT revealed stable hematoma. 2D echo done revealing EF 55-60% with severe calcification AV with mild to moderate regurgitation and moderately calcified MV. Dr. Roda Shutters following for input and ASA d/c due to ICH. Patient with spontaneous R-BG hemorrhage and at hight risk of bleeding if on regular AC. Therapy initiated and CIR recommended for follow up therapy.  Hospital Course: [name] was admitted to rehab 06/21/2014 for inpatient therapies to consist of PT, ST and OT at least three hours five days a week. Past admission physiatrist, therapy team and rehab RN have worked together to provide customized collaborative inpatient rehab.  Blood pressure and heart rate have been monitored on tid basis and have been stable. No complaints of dizziness reported with activity.  She was noted to hyperventilation due to anxiety as well as fatigue. She has been educated on pacing as well as breathing  techniques to help with intermittent hypoxia noted on pulse ox check.  CXR was repeated and was negative for acute changes. ABG was done for confirmation and showed no signs of hypoxia.  Celexa was resumed to help with mood stabilization.  Follow up labs show steady improvement in ABLA and mild hyponatremia has resolved.  Po intake is poor therefore diet was liberalized to regular  and nutritional supplements were offered between meals.  Labs show prealbumin levels at 24.5. Mentation has improved with improvement in left inattention and mild left sided continues. She continues to require cues for safety and is making slow progress due to poor endurance levels. She requires min assist with activity and self care tasks and family is unable to provide care needed. They have elected on SNF for further therapy. Bed is available at [facility] and patient was discharged on 07/06/14 in improved condition.     Rehab course: During patient's stay in rehab weekly team conferences were held to monitor patient's progress, set goals and discuss barriers to discharge. Cognitive evaluation revealed moderate cognitive impairments with decreased attention to left, poor recall of new information and difficulty with functional problem solving. She required moderate assistance with mobility and moderate to max assist with self care tasks. She has had improvement in activity tolerance, balance, postural control, as well as ability to compensate for deficits.  She is able to propel her wheelchair for 100' with supervision. She requires supervision to min assist for transfer and is able to ambulate less than 50' with RW and slow gait. She requires steady assist with bathing, supervision with cues for upper body dressing and min assist for lower body dressing. She requires supervision with cues for mildly complex tasks.    Disposition: Skilled Nursing Facility  Diet: Regular. 4 gram salt restrictions.   Special Instructions: 1.  Follow up with Dr. Roda Shutters for input on resuming ASA.     Medication List    STOP taking these medications        aspirin EC 81 MG tablet     ibuprofen 200 MG tablet  Commonly known as:  ADVIL,MOTRIN     nitroGLYCERIN 0.4 mg/hr patch  Commonly known as:  NITRODUR - Dosed in mg/24 hr      TAKE these medications        acetaminophen 325 MG tablet  Commonly known as:  TYLENOL  Take 1-2 tablets (325-650 mg total) by mouth every 4 (four) hours as needed for mild pain.     alum & mag hydroxide-simeth 200-200-20 MG/5ML suspension  Commonly known as:  MAALOX/MYLANTA  Take 30 mLs by mouth every 4 (four) hours as needed for indigestion.     amiodarone 200 MG tablet  Commonly known as:  PACERONE  TAKE 1/2 TABLET BY MOUTH EVERY DAY     atorvastatin 20 MG tablet  Commonly known as:  LIPITOR  Take 20 mg by mouth daily.     citalopram 20 MG  tablet  Commonly known as:  CELEXA  Take 20 mg by mouth daily.     denosumab 60 MG/ML Soln injection  Commonly known as:  PROLIA  Inject 60 mg into the skin every 6 (six) months. Administer in upper arm, thigh, or abdomen     DIOVAN 160 MG tablet  Generic drug:  valsartan  one table every day     feeding supplement (ENSURE ENLIVE) Liqd  Take 237 mLs by mouth 2 (two) times daily between meals.     pantoprazole 40 MG tablet  Commonly known as:  PROTONIX  Take 1 tablet (40 mg total) by mouth daily.     traZODone 50 MG tablet  Commonly known as:  DESYREL  Take 0.5-1 tablets (25-50 mg total) by mouth at bedtime as needed for sleep.     Vitamin D (Ergocalciferol) 50000 UNITS Caps capsule  Commonly known as:  DRISDOL  Take 50,000 Units by mouth every 7 (seven) days.       Follow-up Information    Follow up with Ranelle OysterSWARTZ,ZACHARY T, MD On 09/14/2014.   Specialty:  Physical Medicine and Rehabilitation   Why:  Be there at 11:40  for  12 noon  appointment   Contact information:   [address] [place] [address] [phone]        Follow up with Xu,Jindong, MD. Call today.   Specialty:  Neurology   Why:  for follow up in 4 weeks.    Contact information:   [address] [place] [phone]       Call Ezequiel KayserPERINI,MARK A, MD.   Specialty:  Internal Medicine   Why:  for follow up in 2 weeks past discharge from SNF.    Contact information:   [address] [place] [address] [phone]       Signed: Jacquelynn CreeLove, Pamela S 07/06/2014, 2:04 PM

## 2014-08-23 ENCOUNTER — Ambulatory Visit: Payer: Self-pay | Admitting: Neurology

## 2014-08-23 ENCOUNTER — Ambulatory Visit (INDEPENDENT_AMBULATORY_CARE_PROVIDER_SITE_OTHER): Payer: Medicare Other | Admitting: Neurology

## 2014-08-23 ENCOUNTER — Encounter: Payer: Self-pay | Admitting: Neurology

## 2014-08-23 VITALS — BP 123/53 | HR 62 | Ht 65.0 in | Wt 109.0 lb

## 2014-08-23 DIAGNOSIS — I610 Nontraumatic intracerebral hemorrhage in hemisphere, subcortical: Secondary | ICD-10-CM | POA: Insufficient documentation

## 2014-08-23 DIAGNOSIS — I6200 Nontraumatic subdural hemorrhage, unspecified: Secondary | ICD-10-CM | POA: Diagnosis not present

## 2014-08-23 DIAGNOSIS — E785 Hyperlipidemia, unspecified: Secondary | ICD-10-CM

## 2014-08-23 DIAGNOSIS — I609 Nontraumatic subarachnoid hemorrhage, unspecified: Secondary | ICD-10-CM | POA: Diagnosis not present

## 2014-08-23 DIAGNOSIS — S065XAA Traumatic subdural hemorrhage with loss of consciousness status unknown, initial encounter: Secondary | ICD-10-CM

## 2014-08-23 DIAGNOSIS — I480 Paroxysmal atrial fibrillation: Secondary | ICD-10-CM

## 2014-08-23 DIAGNOSIS — I10 Essential (primary) hypertension: Secondary | ICD-10-CM | POA: Diagnosis not present

## 2014-08-23 DIAGNOSIS — I612 Nontraumatic intracerebral hemorrhage in hemisphere, unspecified: Secondary | ICD-10-CM

## 2014-08-23 DIAGNOSIS — S065X9A Traumatic subdural hemorrhage with loss of consciousness of unspecified duration, initial encounter: Secondary | ICD-10-CM

## 2014-08-23 NOTE — Progress Notes (Signed)
STROKE NEUROLOGY FOLLOW UP NOTE  NAME: [name] DOB: 1920/02/08  REASON FOR VISIT: stroke follow up HISTORY FROM: chart and pt and daughter  Today we had the pleasure of seeing [name] in follow-up at our Neurology Clinic. Pt was accompanied by daughter.   History Summary [name] is a 81 y.o. female with a history of hypertension, hyperlipidemia, atrial fibrillation on home ASA,and coronary artery disease was admitted to [facility] on 06/16/14 following a fall at home. Posterior scalp laceration was noted as well as left facial droop. CT scan of her head showed bilateral BG ICH with parafalcine SDH and left temporal SAH - SDH/SAH likely traumatic due to fall. Repeat CT head stable hematoma. Stroke work up including CUS, TTE, A1C and LDL unremarkable. No significant BP elevation during admission. She was continued on ARB and discharged to SNF.  Interval History During the interval time, the patient has been doing fine. She was discharged from SNF and current at home, living alone but has CMA coming to help her in the morning. Her daughter will check on her now and then. She was able to walk without device prior to ICH, but now she has to walk with walker. BP better, today in clinic 123/53, but daughter said sometimes in the pm with therapist, her BP up to 170s.   About 3 weeks ago, she is about to have breakfast, daughter said she was not able to stand up from chair, she felt patient had left facial droop and left more than right sided weakness with slurry speech. Daughter picked her up to the table, she ate breakfast first, about 15-20 minutes later, symptoms starting to resolve. She denies any loss of consciousness, word finding difficulties, seizure, or headache. Patient was not sent to hospital or call EMS for evaluation.  REVIEW OF SYSTEMS: Full 14 system review of systems performed and notable only for those listed below and in HPI above, all others are negative:  Constitutional:   Activity change, appetite change, fatigue Cardiovascular:  Ear/Nose/Throat:  Hearing loss, any nose Skin:  Eyes:  Loss of vision Respiratory:  Cough, SOB Gastroitestinal: Diarrhea  Genitourinary: Incontinence of bladder, urgency Hematology/Lymphatic:  Bruise/bleed easily, anemia Endocrine: Heat intolerance Musculoskeletal:  Joint pain, joint swelling, back pain, walking difficulty, neck stiffness Allergy/Immunology:   Neurological:  Memory loss, numbness, speech difficulty, weakness, tremors, facial drooping Psychiatric: Confusion, decreased concentration Sleep:   The following represents the patient's updated allergies and side effects list: Allergies  Allergen Reactions  . Aspirin     REACTION: Intolerance  . Zoledronic Acid     REACTION: Intolerance    The neurologically relevant items on the patient's problem list were reviewed on today's visit.  Neurologic Examination  A problem focused neurological exam (12 or more points of the single system neurologic examination, vital signs counts as 1 point, cranial nerves count for 8 points) was performed.  Blood pressure 123/53, pulse 62, height 5\' 5"  (1.651 m), weight 109 lb (49.442 kg).  General - Well nourished, well developed, in no apparent distress.  Ophthalmologic - fundi not visualized due to noncooperation.  Cardiovascular - Regular rate and rhythm.  Neck - supple, no carotid bruits  Mental Status -  Level of arousal and orientation to time, place, and person were intact. Language including expression, naming, repetition, comprehension was assessed and found intact.  Cranial Nerves II - XII - II - Visual field intact OU. III, IV, VI - Extraocular movements intact. V -  Facial sensation intact bilaterally. VII - left mild nasolabial fold flattening. VIII - Hard of hearing & vestibular intact bilaterally. X - Palate elevates symmetrically. XI - Chin turning & shoulder shrug intact bilaterally. XII - Tongue  protrusion intact.  Motor Strength - The patient's strength was 5-/5 LUE and 5/5 LLE and no pronator drift. 5/5 RUE and RLE. Bulk was normal and fasciculations were absent.  Motor Tone - Muscle tone was assessed at the neck and appendages and was normal.  Reflexes - The patient's reflexes were symmetrical in all extremities and she no pathological reflexes.  Sensory - Light touch, temperature/pinprick were assessed and were symmetrical.   Coordination - The patient had normal movements in the hands with no ataxia or dysmetria. Tremor was absent.  Gait and Station - walk with walker, small stride, no hemiparetic gait, no tendency to fall.  Data reviewed: I personally reviewed the images and agree with the radiology interpretations.  Ct Head Wo Contrast  06/18/2014 IMPRESSION Evolving intraparenchymal hematomas, largest in RIGHT thalamus measuring 15 x 10 mm. Stable multifocal subdural hematomas measuring up to 5 mm along the RIGHT falx. Trace subarachnoid blood in LEFT sylvian fissure. involutional changes. Moderate white matter changes most consistent chronic small vessel ischemic disease.  06/17/2014 IMPRESSION: Persistent changes as described above. There is been slight increase in the size of right thalamic and right parafalcine hematomas as described above.   06/16/2014 IMPRESSION: Positive for intracranial hemorrhage. Largest hemorrhagic focus is located in the right thalamus. There are additional parenchymal hemorrhages in the left white matter tract. The right thalamic hemorrhage may be the cause for the patient's falling and the other areas of hemorrhage could be posttraumatic. In addition, there is evidence for extra-axial blood along the anterior falx and along the left temporal lobe which is compatible with small subdural hematomas. Atrophy and extensive white matter disease. No evidence for midline shift. Multilevel degenerative changes in cervical spine without acute  bone abnormality.   Chest Port 1 View  06/17/2014 IMPRESSION: COPD with biapical parenchymal scarring likely secondary to previous granulomatous infection. There is no acute cardiopulmonary abnormality.   06/16/2014 IMPRESSION: COPD changes with biapical scarring and calcification. Enlargement of cardiac silhouette. No acute abnormalities or interval change.   2D Echocardiogram - Left ventricle: The cavity size was normal. Wall thickness wasnormal. Systolic function was normal. The estimated ejectionfraction was in the range of 55% to 60%. Doppler parameters areconsistent with abnormal left ventricular relaxation (grade 1diastolic dysfunction). - Aortic valve: Severely calcified non coronary cusp. There wasmild stenosis. There was mild to moderate regurgitation. Valvearea (VTI): 1.56 cm^2. Valve area (Vmax): 1.26 cm^2. Valve area(Vmean): 1.39 cm^2. - Mitral valve: Calcified annulus. Moderately thickened leaflets . - Left atrium: The atrium was moderately dilated. - Atrial septum: No defect or patent foramen ovale was identified.  Assessment: As you may recall, she is a 81 y.o. Caucasian female with PMH of HTN, HLD, A. fib on home ASA, and CAD was admitted on 06/16/14 following a fall at home with bilateral BG ICH with parafalcine SDH and left temporal SAH - SDH/SAH likely traumatic due to fall. Repeat CT head stable hematoma. Stroke work up including CUS, TTE, A1C and LDL unremarkable. No significant BP elevation during admission. She was continued on ARB and discharged to SNF and later discharged home. She had episode 3 weeks ago of left facial droop and left more than right weakness with slurry speech, lasting 15-20 minutes, concerning for TIA episode in the setting of A.  fib not on AC. It is a dilemma condition, with bleeding risk at one hand, and risk of stroke at the other hand. Will repeat CT had to confirm resolution of bleeding, and then may need to consider either aspirin or  Eliquis after discussion with family.  Plan:  - CT head to follow-up with ICH/SDH/SAH - will need to consider either ASA or eliquis later after discussion with family - Follow up with your primary care physician for stroke risk factor modification. Recommend maintain blood pressure goal <130/80, diabetes with hemoglobin A1c goal below 6.5% and lipids with LDL cholesterol goal below 70 mg/dL.  - check BP at home and record and bring over to PCP for medication adjustment if needed.  - RTC in 2 months.  I spent more than 25 minutes of face to face time with the patient. Greater than 50% of time was spent in counseling and coordination of care. We have discussed about the dilemma condition with risk of bleeding and recent of a stroke in the setting of different medication choices.  Orders Placed This Encounter  Procedures  . CT Head Wo Contrast    Standing Status: Future     Number of Occurrences:      Standing Expiration Date: 11/23/2015    Order Specific Question:  Reason for Exam (SYMPTOM  OR DIAGNOSIS REQUIRED)    Answer:  ICH follow up    Order Specific Question:  Preferred imaging location?    Answer:  Internal    No orders of the defined types were placed in this encounter.    Patient Instructions  - will do CT head to see the complete resolution of blood in brain - will need to consider either ASA or eliquis if blood all absorbed for stroke prevention - Follow up with your primary care physician for stroke risk factor modification. Recommend maintain blood pressure goal <130/80, diabetes with hemoglobin A1c goal below 6.5% and lipids with LDL cholesterol goal below 70 mg/dL.  - check BP at home and record and bring over to PCP for medication adjustment if needed.  - follow up in 2 months.    Marvel Plan, MD PhD [facility] [address], [address] [place], [place] [address] [phone]

## 2014-08-23 NOTE — Patient Instructions (Signed)
-   will do CT head to see the complete resolution of blood in brain - will need to consider either ASA or eliquis if blood all absorbed for stroke prevention - Follow up with your primary care physician for stroke risk factor modification. Recommend maintain blood pressure goal <130/80, diabetes with hemoglobin A1c goal below 6.5% and lipids with LDL cholesterol goal below 70 mg/dL.  - check BP at home and record and bring over to PCP for medication adjustment if needed.  - follow up in 2 months.

## 2014-08-31 ENCOUNTER — Telehealth: Payer: Self-pay | Admitting: Neurology

## 2014-08-31 NOTE — Telephone Encounter (Signed)
Daughter is calling to check status of CT Scan Dr Roda Shutters was going to set up for Mom

## 2014-09-02 NOTE — Telephone Encounter (Signed)
I have ordered CT head without contrast for her on her last visit with me on 08/23/14. I am not sure whether pt got called for the CT appointment.   Hi, Casandra, could you please look into the CT appointment for her? Thanks a lot.  Marvel Plan, MD PhD Stroke Neurology 09/02/2014 2:56 PM

## 2014-09-05 NOTE — Telephone Encounter (Signed)
Spoke to patient. Advised that I called [facility]. Rep stated the person who schedules the CT's has quite a few right now and she is working as quickly as possible to get them scheduled. Patient verbalized understanding.

## 2014-09-06 ENCOUNTER — Telehealth: Payer: Self-pay

## 2014-09-06 NOTE — Telephone Encounter (Signed)
Call daughter's cell phone [phone]

## 2014-09-06 NOTE — Telephone Encounter (Signed)
Call patients daughter on her cell Mrs Neale Burly. Rn explain to daughter that referral was sent over for scheduling.Gave patients daughter [facility] to call for the scheduling.Patients daughter understands.

## 2014-09-06 NOTE — Telephone Encounter (Signed)
Terrall Laity (daughter) called upset that no one has called her to schedule CT Scan. She has had the insurance approval for this for a week now and still hasn't heard from anyone to schedule the CT Scan

## 2014-09-13 ENCOUNTER — Ambulatory Visit
Admission: RE | Admit: 2014-09-13 | Discharge: 2014-09-13 | Disposition: A | Discharge status: Home / Self Care | Payer: Medicare Other | Source: Ambulatory Visit | Attending: Neurology | Admitting: Neurology

## 2014-09-13 DIAGNOSIS — I612 Nontraumatic intracerebral hemorrhage in hemisphere, unspecified: Secondary | ICD-10-CM

## 2014-09-14 ENCOUNTER — Inpatient Hospital Stay: Payer: Medicare Other | Admitting: Physical Medicine & Rehabilitation

## 2014-09-15 ENCOUNTER — Other Ambulatory Visit: Payer: Self-pay | Admitting: Neurology

## 2014-09-15 DIAGNOSIS — I480 Paroxysmal atrial fibrillation: Secondary | ICD-10-CM

## 2014-09-15 MED ORDER — APIXABAN 2.5 MG PO TABS: 2.5000 mg | ORAL_TABLET | Freq: Two times a day (BID) | ORAL | Status: DC

## 2014-09-15 NOTE — Progress Notes (Signed)
Discussed with daughter about her CT head showing resolution of the ICH, SDH and SAH. Also discussed about stroke prevention in the setting of afib and recent TIA episode. I recommend eliquis for stroke prevention and according to her body weight and age, will recommend 2.5mg  bid. Daughter expressed understanding and appreciation. Prescription sent to pharmacy.  Marvel Plan, MD PhD Stroke Neurology 09/15/2014 5:25 PM

## 2014-10-17 ENCOUNTER — Encounter: Payer: Self-pay | Admitting: *Deleted

## 2014-11-02 ENCOUNTER — Ambulatory Visit (INDEPENDENT_AMBULATORY_CARE_PROVIDER_SITE_OTHER): Payer: Medicare Other | Admitting: Neurology

## 2014-11-02 ENCOUNTER — Encounter: Payer: Self-pay | Admitting: Neurology

## 2014-11-02 VITALS — BP 120/62 | HR 61 | Ht 65.0 in | Wt 106.4 lb

## 2014-11-02 DIAGNOSIS — I10 Essential (primary) hypertension: Secondary | ICD-10-CM

## 2014-11-02 DIAGNOSIS — I6200 Nontraumatic subdural hemorrhage, unspecified: Secondary | ICD-10-CM | POA: Diagnosis not present

## 2014-11-02 DIAGNOSIS — E785 Hyperlipidemia, unspecified: Secondary | ICD-10-CM | POA: Diagnosis not present

## 2014-11-02 DIAGNOSIS — S065XAA Traumatic subdural hemorrhage with loss of consciousness status unknown, initial encounter: Secondary | ICD-10-CM

## 2014-11-02 DIAGNOSIS — M25511 Pain in right shoulder: Secondary | ICD-10-CM

## 2014-11-02 DIAGNOSIS — I610 Nontraumatic intracerebral hemorrhage in hemisphere, subcortical: Secondary | ICD-10-CM | POA: Diagnosis not present

## 2014-11-02 DIAGNOSIS — I480 Paroxysmal atrial fibrillation: Secondary | ICD-10-CM

## 2014-11-02 DIAGNOSIS — M25519 Pain in unspecified shoulder: Secondary | ICD-10-CM | POA: Insufficient documentation

## 2014-11-02 DIAGNOSIS — D500 Iron deficiency anemia secondary to blood loss (chronic): Secondary | ICD-10-CM | POA: Diagnosis not present

## 2014-11-02 DIAGNOSIS — S065X9A Traumatic subdural hemorrhage with loss of consciousness of unspecified duration, initial encounter: Secondary | ICD-10-CM

## 2014-11-02 NOTE — Progress Notes (Signed)
STROKE NEUROLOGY FOLLOW UP NOTE  NAME: [name] DOB: [birth year]  REASON FOR VISIT: stroke follow up HISTORY FROM: chart and pt and daughter  Today we had the pleasure of seeing [name] in follow-up at our Neurology Clinic. Pt was accompanied by daughter.   History Summary [name] is a 81 y.o. female with a history of hypertension, hyperlipidemia, atrial fibrillation on home ASA,and coronary artery disease was admitted to [facility] on 06/16/14 following a fall at home. Posterior scalp laceration was noted as well as left facial droop. CT scan of her head showed bilateral BG ICH with parafalcine SDH and left temporal SAH - SDH/SAH likely traumatic due to fall. Repeat CT head stable hematoma. Stroke work up including CUS, TTE, A1C and LDL unremarkable. No significant BP elevation during admission. She was continued on ARB and discharged to SNF.  08/23/14 follow up - the patient has been doing fine. She was discharged from SNF and current at home, living alone but has CMA coming to help her in the morning. Her daughter will check on her now and then. She was able to walk without device prior to ICH, but now she has to walk with walker. BP better, today in clinic 123/53, but daughter said sometimes in the pm with therapist, her BP up to 170s.   About 3 weeks ago, she is about to have breakfast, daughter said she was not able to stand up from chair, she felt patient had left facial droop and left more than right sided weakness with slurry speech. Daughter picked her up to the table, she ate breakfast first, about 15-20 minutes later, symptoms starting to resolve. She denies any loss of consciousness, word finding difficulties, seizure, or headache. Patient was not sent to hospital or call EMS for evaluation.  Interval History During the interval time, pt had CT head in 08/2014 showed resolution of ICH, SDH. After discussion with family, she was put on eliquis 2.5mg  bid for stroke prevention due to  afib. However, she started to have dark stool and generalized weakness and no energy. She called her PCP Dr. Waynard Edwards and eliquis stopped. She was advised to go to ER but she refused. For the last 1-2 weeks, she started to have right shoulder pain and limited ROM of right shoulder. Family contributed it to the use of walker.   REVIEW OF SYSTEMS: Full 14 system review of systems performed and notable only for those listed below and in HPI above, all others are negative:  Constitutional:  Activity change, fatigue Cardiovascular:  Ear/Nose/Throat:  Hearing loss, any nose Skin:  Eyes:  Respiratory:  SOB Gastroitestinal: black stools  Genitourinary: Incontinence of bladder Hematology/Lymphatic:  Bruise and bleeding easily, anemia Endocrine: cold intolerance Musculoskeletal:  Joint pain Allergy/Immunology:   Neurological:  numbness, weakness, tremors Psychiatric:  Sleep:   The following represents the patient's updated allergies and side effects list: Allergies  Allergen Reactions  . Aspirin     REACTION: Intolerance  . Zoledronic Acid     REACTION: Intolerance    The neurologically relevant items on the patient's problem list were reviewed on today's visit.  Neurologic Examination  A problem focused neurological exam (12 or more points of the single system neurologic examination, vital signs counts as 1 point, cranial nerves count for 8 points) was performed.  Blood pressure 120/62, pulse 61, height 5\' 5"  (1.651 m), weight 106 lb 6.4 oz (48.263 kg).  General - Well nourished, well developed, in no apparent  distress.  Ophthalmologic - fundi not visualized due to noncooperation.  Cardiovascular - Regular rate and rhythm.  Neck - supple, no carotid bruits  Mental Status -  Level of arousal and orientation to time, place, and person were intact. Language including expression, naming, repetition, comprehension was assessed and found intact.  Cranial Nerves II - XII - II - Visual  field intact OU. III, IV, VI - Extraocular movements intact. V - Facial sensation intact bilaterally. VII - facial symmetrical. VIII - Hard of hearing & vestibular intact bilaterally. X - Palate elevates symmetrically. XI - Chin turning & shoulder shrug intact bilaterally. XII - Tongue protrusion intact.  Motor Strength - The patient's strength was 5-/5 in all extremities except right shoulder limited ROM due to pain and no pronator drift. 5/5 RUE and RLE. Bulk was normal and fasciculations were absent.  Motor Tone - Muscle tone was assessed at the neck and appendages and was normal.  Reflexes - The patient's reflexes were symmetrical in all extremities and she no pathological reflexes.  Sensory - Light touch, temperature/pinprick were assessed and were symmetrical.   Coordination - The patient had normal movements in the hands with no ataxia or dysmetria. Tremor was absent.  Gait and Station - in wheelchair, gait not tested due to fatigue.  Data reviewed: I personally reviewed the images and agree with the radiology interpretations.  Ct Head Wo Contrast  06/18/2014 IMPRESSION Evolving intraparenchymal hematomas, largest in RIGHT thalamus measuring 15 x 10 mm. Stable multifocal subdural hematomas measuring up to 5 mm along the RIGHT falx. Trace subarachnoid blood in LEFT sylvian fissure. involutional changes. Moderate white matter changes most consistent chronic small vessel ischemic disease.  06/17/2014 IMPRESSION: Persistent changes as described above. There is been slight increase in the size of right thalamic and right parafalcine hematomas as described above.   06/16/2014 IMPRESSION: Positive for intracranial hemorrhage. Largest hemorrhagic focus is located in the right thalamus. There are additional parenchymal hemorrhages in the left white matter tract. The right thalamic hemorrhage may be the cause for the patient's falling and the other areas of hemorrhage could be  posttraumatic. In addition, there is evidence for extra-axial blood along the anterior falx and along the left temporal lobe which is compatible with small subdural hematomas. Atrophy and extensive white matter disease. No evidence for midline shift. Multilevel degenerative changes in cervical spine without acute bone abnormality.   Chest Port 1 View  06/17/2014 IMPRESSION: COPD with biapical parenchymal scarring likely secondary to previous granulomatous infection. There is no acute cardiopulmonary abnormality.   06/16/2014 IMPRESSION: COPD changes with biapical scarring and calcification. Enlargement of cardiac silhouette. No acute abnormalities or interval change.   2D Echocardiogram - Left ventricle: The cavity size was normal. Wall thickness wasnormal. Systolic function was normal. The estimated ejectionfraction was in the range of 55% to 60%. Doppler parameters areconsistent with abnormal left ventricular relaxation (grade 1diastolic dysfunction). - Aortic valve: Severely calcified non coronary cusp. There wasmild stenosis. There was mild to moderate regurgitation. Valvearea (VTI): 1.56 cm^2. Valve area (Vmax): 1.26 cm^2. Valve area(Vmean): 1.39 cm^2. - Mitral valve: Calcified annulus. Moderately thickened leaflets . - Left atrium: The atrium was moderately dilated. - Atrial septum: No defect or patent foramen ovale was identified.  CT head 09/14/14 - Slightly abnormal CT scan of the head showing age-appropriate changes of chronic microvascular ischemia and generalized cerebral atrophy. Expected evolution of of previously seen right thamici, left corona radiata, falx subdural and left sylvian fissure subarachnoid hemorrhages.  Assessment: As you may recall, she is a 81 y.o. Caucasian female with PMH of HTN, HLD, A. fib on home ASA, and CAD was admitted on 06/16/14 following a fall at home with bilateral BG ICH with parafalcine SDH and left temporal SAH - SDH/SAH likely traumatic  due to fall. Repeat CT head stable hematoma. Stroke work up including CUS, TTE, A1C and LDL unremarkable. No significant BP elevation during admission. She was continued on ARB and discharged to SNF and later discharged home. She had episode 3 weeks ago of left facial droop and left more than right weakness with slurry speech, lasting 15-20 minutes, concerning for TIA episode in the setting of A. fib not on AC. It is a dilemma condition, with bleeding risk at one hand, and risk of stroke at the other hand. Repeat CT confirmed resolution of bleeding, and Eliquis 2.5mg  bid started after discussion with family. However, pt developed dark stool and generalized weakness, and eliquis stopped. Will need to check anemia. Also X-ray for right shoulder work up and PT/OT.  Plan:  - continue lipitor for stroke prevention - continue follow up with cardiology - hold off ASA for now. Will check blood test to rule out anemia first - will do X-ray of right shoulder to rule out dislocation or fracture - will refer to PT/OT after X-ray clears  - Follow up with your primary care physician for stroke risk factor modification. Recommend maintain blood pressure goal <130/80, diabetes with hemoglobin A1c goal below 6.5% and lipids with LDL cholesterol goal below 70 mg/dL.  - check BP at home - RTC in 3 months.  I spent more than 25 minutes of face to face time with the patient. Greater than 50% of time was spent in counseling and coordination of care. We have discussed about the dilemma condition with risk of bleeding and recent of a stroke in the setting of different medication choices.  Orders Placed This Encounter  Procedures  . DG Shoulder Right    Standing Status: Future     Number of Occurrences:      Standing Expiration Date: 01/02/2016    Order Specific Question:  Reason for Exam (SYMPTOM  OR DIAGNOSIS REQUIRED)    Answer:  right shoulder pain    Order Specific Question:  Preferred imaging location?    Answer:   Internal  . CBC (no diff)  . Basic metabolic panel  . Ambulatory referral to Physical Therapy    Referral Priority:  Routine    Referral Type:  Physical Medicine    Referral Reason:  Specialty Services Required    Requested Specialty:  Physical Therapy    Number of Visits Requested:  1  . Ambulatory referral to Occupational Therapy    Referral Priority:  Routine    Referral Type:  Occupational Therapy    Referral Reason:  Specialty Services Required    Requested Specialty:  Occupational Therapy    Number of Visits Requested:  1    Meds ordered this encounter  Medications  . amLODipine (NORVASC) 5 MG tablet    Sig:     Patient Instructions  - continue lipitor for stroke prevention - continue follow up with cardiology - hold off ASA for now. Will check blood test to rule out anemia first - will do X-ray of right shoulder to rule out dislocation or fracture - will refer to PT/OT after X-ray clears  - Follow up with your primary care physician for stroke risk factor modification. Recommend maintain  blood pressure goal <130/80, diabetes with hemoglobin A1c goal below 6.5% and lipids with LDL cholesterol goal below 70 mg/dL.  - check BP at home - follow up in 3 months.    Marvel Plan, MD PhD [facility] [address] [place], [place] [address] [phone]

## 2014-11-02 NOTE — Patient Instructions (Signed)
-   continue lipitor for stroke prevention - continue follow up with cardiology - hold off ASA for now. Will check blood test to rule out anemia first - will do X-ray of right shoulder to rule out dislocation or fracture - will refer to PT/OT after X-ray clears  - Follow up with your primary care physician for stroke risk factor modification. Recommend maintain blood pressure goal <130/80, diabetes with hemoglobin A1c goal below 6.5% and lipids with LDL cholesterol goal below 70 mg/dL.  - check BP at home - follow up in 3 months.

## 2014-11-03 LAB — BASIC METABOLIC PANEL
BUN/Creatinine Ratio: 24 (ref 11–26)
BUN: 20 mg/dL (ref 10–36)
CHLORIDE: 98 mmol/L (ref 97–108)
CO2: 22 mmol/L (ref 18–29)
Calcium: 9.2 mg/dL (ref 8.7–10.3)
Creatinine, Ser: 0.83 mg/dL (ref 0.57–1.00)
GFR calc Af Amer: 71 mL/min/{1.73_m2} (ref >59)
GFR calc non Af Amer: 61 mL/min/{1.73_m2} (ref >59)
Glucose: 91 mg/dL (ref 65–99)
POTASSIUM: 4.8 mmol/L (ref 3.5–5.2)
Sodium: 138 mmol/L (ref 134–144)

## 2014-11-03 LAB — CBC
Hematocrit: 33.9 % — ABNORMAL LOW (ref 34.0–46.6)
Hemoglobin: 11.1 g/dL (ref 11.1–15.9)
MCH: 30.1 pg (ref 26.6–33.0)
MCHC: 32.7 g/dL (ref 31.5–35.7)
MCV: 92 fL (ref 79–97)
PLATELETS: 223 10*3/uL (ref 150–379)
RBC: 3.69 x10E6/uL — ABNORMAL LOW (ref 3.77–5.28)
RDW: 14.7 % (ref 12.3–15.4)
WBC: 7.7 10*3/uL (ref 3.4–10.8)

## 2014-11-07 ENCOUNTER — Telehealth: Payer: Self-pay | Admitting: Neurology

## 2014-11-07 ENCOUNTER — Ambulatory Visit
Admission: RE | Admit: 2014-11-07 | Discharge: 2014-11-07 | Disposition: A | Discharge status: Home / Self Care | Payer: Medicare Other | Source: Ambulatory Visit | Attending: Neurology | Admitting: Neurology

## 2014-11-07 DIAGNOSIS — M25511 Pain in right shoulder: Secondary | ICD-10-CM

## 2014-11-07 NOTE — Telephone Encounter (Signed)
I called and spoke to Alona Bene, daughter about the lab results.  I did relay from the dg xray no fractures noted, but Dr. Roda Shutters had not viewed the report as yet, and we will call her back once he does.  She was asking about therapy referral for pt, she though HH would be a better for the pt.  Pt is using assistive devices for walking and is having a hard time gripping.  (L with stroke and R with  shoulder).  She has used [facility] (steven sanderson -PT) before.  Please let me know if can change from outpt to [facility].

## 2014-11-07 NOTE — Telephone Encounter (Signed)
Pt's daughter called inquiring about labs. She also inquiring about xrays of shoulder. Please call and advise at [phone].

## 2014-11-07 NOTE — Telephone Encounter (Signed)
-----   Message from Marvel Plan, MD sent at 11/04/2014  2:04 PM EDT ----- Hi, Katrina:  Please let the pt or her daughter know that her blood test the other day in our office was unremarkable. No significant anemia, the hemoglobin is 11, the same as 4 months ago. Continue the current treatment plan. Thanks.  Marvel Plan, MD PhD Stroke Neurology 11/04/2014 2:04 PM

## 2014-11-08 ENCOUNTER — Encounter (HOSPITAL_COMMUNITY): Payer: Medicare Other

## 2014-11-08 NOTE — Telephone Encounter (Signed)
Yes, shoulder Xray no fracture or dislocation. Yes, please change from outpt to [facility]. Thanks so much for your help.  Marvel Plan, MD PhD Stroke Neurology 11/08/2014 5:45 PM

## 2014-11-09 ENCOUNTER — Other Ambulatory Visit: Payer: Self-pay | Admitting: *Deleted

## 2014-11-09 DIAGNOSIS — M25511 Pain in right shoulder: Secondary | ICD-10-CM

## 2014-11-09 DIAGNOSIS — I6200 Nontraumatic subdural hemorrhage, unspecified: Secondary | ICD-10-CM

## 2014-11-09 DIAGNOSIS — I610 Nontraumatic intracerebral hemorrhage in hemisphere, subcortical: Secondary | ICD-10-CM

## 2014-11-09 NOTE — Progress Notes (Signed)
Since pt needs assistance when leaving home, (uses walker, and issues with balance), will do HH vs outpt.

## 2014-11-09 NOTE — Telephone Encounter (Signed)
I spoke to daughter, Alona Benejoyce.  Relayed the information below.  HH referral placed. She asked about cost.   I told her that referral would be faxed today.  She could call later on and check with them re: billing. She verbalized understanding.

## 2014-11-14 ENCOUNTER — Telehealth: Payer: Self-pay | Admitting: Neurology

## 2014-11-14 NOTE — Telephone Encounter (Signed)
[facility], Kristen , called and wanted Dr. Roda ShuttersXu to know that pt is postponing care until they find out if insurance will cover. May call Kristen at [phone] if questions

## 2014-11-14 NOTE — Telephone Encounter (Signed)
Rn call Baxter [facility]ireKristen [facility] at [phone] to find out about therapy. Baxter HireKristen stated pts insurance is paying for therapy and she is receiving it now.

## 2014-11-15 NOTE — Telephone Encounter (Signed)
[name] [facility] called requesting orders for PT 2 x wk for 6 wks and OT evaluation. Please call and advise at [phone]

## 2014-11-15 NOTE — Telephone Encounter (Signed)
Rn call Danielle with [facility] and gave verbal from Dr Roda ShuttersXu for patient to have Pt and OT orders.

## 2014-11-18 ENCOUNTER — Telehealth: Payer: Self-pay | Admitting: Neurology

## 2014-11-18 NOTE — Telephone Encounter (Signed)
[facility] would like documentation regarding pts cva and if it is related to abnormal gait. Please call and advise [phone] [facility]

## 2014-11-18 NOTE — Telephone Encounter (Signed)
Returned call to [facility]risten. She was asking for verbal order for home health nurse visit to patient which I gave over the phone. She also mentioned about documentation of cause of abnormal gait. I think she most likely has baseline gait disability which likely the cause of her fall in May this year resulting b/l BG ICH as well as parafalcine SDH and left temporal SAH. Her gait disability is worsened due to the ICH as well as resultant physical deconditioning. Baxter HireKristen would like this to be documented for coding purposes.   Hi, Katrina, could you please fax the above statement to her? Not in hurry though. Thanks.  Marvel PlanJindong Miah Boye, MD PhD Stroke Neurology 11/18/2014 1:41 PM

## 2014-11-21 ENCOUNTER — Telehealth: Payer: Self-pay | Admitting: Neurology

## 2014-11-21 NOTE — Telephone Encounter (Signed)
Rb call Harriett SineNancy from [facility] about getting orders for Pt and OT for 5 more weeks. Harriett Sineancy stated the patient had a fall but x ray showed no fractures. Rn explain that if patient is having more problems with her shoulder its best to consult her PCP or orthopedic for pain issues. Harriett Sineancy stated she also needed orders for home health aide to help daughter out. Harriett Sineancy stated she will fax over the plan of care for Dr Roda ShuttersXu to sign. Rn stated Dr. Roda ShuttersXu could not give advice on how many therapy she needs on the pts shoulder, it would be at her discretion as the therapist.

## 2014-11-21 NOTE — Telephone Encounter (Signed)
Hal NeerNancy Winkler with [facility] called needs orders for 1x wk then 2x wk for 5 wks. Also she did assessment on Saturday, she did have a fall and Dr ordered Herby AbrahamXray- no fracture. There is something wrong with rt shoulder- very little movement, alot of pain. She needs guidance on what needs to be done. Daughter is getting burned out. Harriett Sineancy is asking for home health aid to come the 2 x wk she is there. Please call and advise at [phone]

## 2014-11-21 NOTE — Telephone Encounter (Signed)
Rn call Baxter [facility]Kristen [facility] at [phone] regarding needing a statement for patients abnormal gait for coding. Rn stated Dr. Roda ShuttersXu wrote a statement regarding the patient in a phone note. Baxter HireKristen stated that would be sufficient. Note fax to [phone]  [phone].

## 2014-11-29 ENCOUNTER — Telehealth: Payer: Self-pay | Admitting: Neurology

## 2014-11-29 NOTE — Telephone Encounter (Signed)
[facility] is calling and states that the physician therapist did not see a need for nursing care for the patient so the order was cancelled.  Thanks!

## 2014-12-07 ENCOUNTER — Ambulatory Visit: Payer: Medicare Other | Admitting: Nurse Practitioner

## 2014-12-13 ENCOUNTER — Ambulatory Visit: Payer: Medicare Other | Admitting: Nurse Practitioner

## 2014-12-14 ENCOUNTER — Ambulatory Visit (INDEPENDENT_AMBULATORY_CARE_PROVIDER_SITE_OTHER): Payer: Medicare Other | Admitting: Nurse Practitioner

## 2014-12-14 ENCOUNTER — Telehealth: Payer: Self-pay | Admitting: Neurology

## 2014-12-14 ENCOUNTER — Encounter: Payer: Self-pay | Admitting: Nurse Practitioner

## 2014-12-14 ENCOUNTER — Ambulatory Visit (HOSPITAL_COMMUNITY): Payer: Medicare Other

## 2014-12-14 VITALS — BP 130/42 | HR 60 | Ht 66.0 in | Wt 104.8 lb

## 2014-12-14 DIAGNOSIS — Z79899 Other long term (current) drug therapy: Secondary | ICD-10-CM

## 2014-12-14 DIAGNOSIS — I480 Paroxysmal atrial fibrillation: Secondary | ICD-10-CM

## 2014-12-14 LAB — BASIC METABOLIC PANEL
BUN: 17 mg/dL (ref 7–25)
CO2: 29 mmol/L (ref 20–31)
Calcium: 9 mg/dL (ref 8.6–10.4)
Chloride: 102 mmol/L (ref 98–110)
Creat: 0.84 mg/dL (ref 0.60–0.88)
Glucose, Bld: 83 mg/dL (ref 65–99)
Potassium: 4.4 mmol/L (ref 3.5–5.3)
Sodium: 137 mmol/L (ref 135–146)

## 2014-12-14 LAB — CBC
HCT: 30.8 % — ABNORMAL LOW (ref 36.0–46.0)
Hemoglobin: 10.3 g/dL — ABNORMAL LOW (ref 12.0–15.0)
MCH: 30 pg (ref 26.0–34.0)
MCHC: 33.4 g/dL (ref 30.0–36.0)
MCV: 89.8 fL (ref 78.0–100.0)
MPV: 10.1 fL (ref 8.6–12.4)
Platelets: 217 10*3/uL (ref 150–400)
RBC: 3.43 MIL/uL — ABNORMAL LOW (ref 3.87–5.11)
RDW: 13.7 % (ref 11.5–15.5)
WBC: 7.1 10*3/uL (ref 4.0–10.5)

## 2014-12-14 LAB — HEPATIC FUNCTION PANEL
ALT: 13 U/L (ref 6–29)
AST: 14 U/L (ref 10–35)
Albumin: 3.9 g/dL (ref 3.6–5.1)
Alkaline Phosphatase: 64 U/L (ref 33–130)
Bilirubin, Direct: 0.1 mg/dL (ref <=0.2)
Indirect Bilirubin: 0.3 mg/dL (ref 0.2–1.2)
Total Bilirubin: 0.4 mg/dL (ref 0.2–1.2)
Total Protein: 6.5 g/dL (ref 6.1–8.1)

## 2014-12-14 LAB — TSH: TSH: 1.829 u[IU]/mL (ref 0.350–4.500)

## 2014-12-14 NOTE — Patient Instructions (Addendum)
We will be checking the following labs today - BMET, CBC, TSH, HPF   Medication Instructions:    Continue with your current medicines.     Testing/Procedures To Be Arranged:  N/A  Follow-Up:   See me in 6 months    Other Special Instructions:   N/A    If you need a refill on your cardiac medications before your next appointment, please call your pharmacy.   Call the [facility] office at [phone] if you have any questions, problems or concerns.

## 2014-12-14 NOTE — Telephone Encounter (Signed)
Noted. Thank you. Please let pt contact her PCP for management.   Marvel PlanJindong Ryatt Corsino, MD PhD Stroke Neurology 12/14/2014 6:28 PM

## 2014-12-14 NOTE — Telephone Encounter (Signed)
[facility] is Dr. Waynard EdwardsPerini. Rn explain we did the rehab order for therapy. Mallory explain that they have to notify md of blood pressure issues. Rn explain pts PCP usually handles the BP and neurology deals with the neuro issues. Rn stated Dr. Roda ShuttersXu will be notified.

## 2014-12-14 NOTE — Progress Notes (Addendum)
CARDIOLOGY OFFICE NOTE  Date:  12/14/2014    [name] Date of Birth: 1920/06/04 Medical Record #[id number]  PCP:  Ezequiel Kayser, MD  Cardiologist:  [facility]    Chief Complaint  Patient presents with  . Atrial Fibrillation    6 month check - seen for Dr. Antoine Poche    History of Present Illness: [name] is a 81 y.o. female who presents today for a follow up visit. Seen for Dr. Antoine Poche. She is a former patient of Dr. Ronnald Nian. She has a history of paroxysmal atrial fibrillation, flash pulmonary edema associated with rapid atrial fibrillation, and mild to moderate aortic insufficiency on 2-D echo in 2010. She had moderate coronary artery disease on cath in 2010 with 60% ostial stenosis of the RCA and 60-70% mid LAD with an EF of 45%. She has been managed medically. Other issues include PUD, IBS, and carotid disease. Sees Dr. Waynard Edwards for primary care.   Seen back in May of 2015 she had been noted to have positive hemoccult cards and did not wish to pursue any workup. Her dose of Diovan had been cut back due to low BP and dizziness. Prior EKG has shown sinus but with prolonged QT at 502 and QTc at 513 - she was also on Celexa - she is on less amiodarone at 100 mg a day.   Last seen by me back in May and was doing well. Continued to live alone.   She was admitted on 06/16/14 after a fall on her back porch.  No LOC but patient with posterior scalp laceration and left facial droop noted by family. She was evaluated in ED and found to have right thalamic hemorrhage with additional small hemorrhagic foci around right thalamic hematoma, parafalcine hematoma, trace SAH along right falx andsmall SDH left temporal lobe. Serial CCT revealed stable hematoma. 2D echo done revealing EF 55-60% with severe calcification AV with mild to moderate regurgitation and moderately calcified MV. Rehab therapy initiated but she ended up being transferred to [facility] on June 8.  Had a spell in September  of where she was not able to stand - more facial droop and right sided weakness. Did not wish to seek care. She had had resolution of ICH, SDH and then was put on low dose Eliquis - but this caused GI bleeding and was discontinued. She remains off of all anticoagulation.   Comes back today. Here with her daughter. She is in a wheelchair today. Pretty weak.  Events from the spring noted. She is back at home. Apparently tore up her rotator cuff with this fall as well. Getting PT/OT. Also involved in a money scam - 2 girls under the guise of selling magazines - went thru her house to look for medicines - she had written a check for $1000 but was able to stop the payment. She is weak. Now staying downstairs in her home. Has mobile meals. No chest pain. Breathing is ok. Just feels weak. She wishes for comfort measures only and is not interested in any testing/heroic measures.    Past Medical History  Diagnosis Date  . Atrial fibrillation with rapid ventricular response [facility]) Feb 2010  . Hypertension   . Flash pulmonary edema [facility]) Feb 2010    Due to atrial fib with RVR  . Aortic insufficiency Feb 2010    Mild to moderate per echo with normal EF  . Arthritis   . Hyperlipidemia   . Dementia   .  Vitamin D deficiency   . IBS (irritable bowel syndrome)   . PUD (peptic ulcer disease)   . Compression fracture   . Raynaud phenomenon   . CAD (coronary artery disease)     2010.  60% RCA, 60 - 70% LAD  . Carotid stenosis     40 - 59% bilateral  . Stroke [facility])     Past Surgical History  Procedure Laterality Date  . Cardiac catheterization  03/11/2008    EF 45%  . Appendectomy    . Transthoracic echocardiogram  03/06/2008    EF 55%     Medications: Current Outpatient Prescriptions  Medication Sig Dispense Refill  . acetaminophen (TYLENOL) 325 MG tablet Take 1-2 tablets (325-650 mg total) by mouth every 4 (four) hours as needed for mild pain.    [name] Kitchen amiodarone (PACERONE) 200 MG tablet TAKE 1/2  TABLET BY MOUTH EVERY DAY 30 tablet 6  . amLODipine (NORVASC) 5 MG tablet     . atorvastatin (LIPITOR) 20 MG tablet Take 20 mg by mouth daily.    . citalopram (CELEXA) 20 MG tablet Take 20 mg by mouth daily.     [name] Kitchen denosumab (PROLIA) 60 MG/ML SOLN injection Inject 60 mg into the skin every 6 (six) months. Administer in upper arm, thigh, or abdomen    . feeding supplement, ENSURE ENLIVE, (ENSURE ENLIVE) LIQD Take 237 mLs by mouth 2 (two) times daily between meals. 237 mL 12  . pantoprazole (PROTONIX) 40 MG tablet Take 1 tablet (40 mg total) by mouth daily.    . traZODone (DESYREL) 50 MG tablet Take 0.5-1 tablets (25-50 mg total) by mouth at bedtime as needed for sleep. (Patient taking differently: Take 25 mg by mouth at bedtime. )    . valsartan (DIOVAN) 160 MG tablet one table every day    . Vitamin D, Ergocalciferol, (DRISDOL) 50000 UNITS CAPS Take 50,000 Units by mouth every 7 (seven) days.       No current facility-administered medications for this visit.    Allergies: Allergies  Allergen Reactions  . Aspirin     REACTION: Intolerance  . Zoledronic Acid     REACTION: Intolerance    Social History: The patient  reports that she has never smoked. She has never used smokeless tobacco. She reports that she does not drink alcohol or use illicit drugs.   Family History: The patient's family history includes Cancer in her mother; Heart attack in her father; Stroke in her mother.   Review of Systems: Please see the history of present illness.   Otherwise, the review of systems is positive for none.   All other systems are reviewed and negative.   Physical Exam: VS:  BP 130/42 mmHg  Pulse 60  Ht  (1.676 m)  Wt 104 lb 12.8 oz (47.537 kg)  BMI 16.92 kg/m2  SpO2 98% .  BMI Body mass index is 16.92 kg/(m^2).  Wt Readings from Last 3 Encounters:  12/14/14 104 lb 12.8 oz (47.537 kg)  11/02/14 106 lb 6.4 oz (48.263 kg)  08/23/14 109 lb (49.442 kg)    General: Pleasant. She is in  a wheelchair today - she looks weak but in no acute distress. Remains quite thin.  HEENT: Normal. Neck: Supple, no JVD, carotid bruits, or masses noted.  Cardiac: Regular rate and rhythm. Outflow murmur noted.  No edema.  Respiratory:  Lungs are clear to auscultation bilaterally with normal work of breathing.  GI: Soft and nontender.  MS: No deformity or  atrophy. Gait not tested.  Skin: Warm and dry. Color is normal.  Neuro:  Strength and sensation are intact and no gross focal deficits noted.  Psych: Alert, appropriate and with normal affect.   LABORATORY DATA:  EKG:  EKG is not ordered today.  Lab Results  Component Value Date   WBC 7.7 11/02/2014   HGB 11.2* 07/01/2014   HCT 33.9* 11/02/2014   PLT 353 07/01/2014   GLUCOSE 91 11/02/2014   CHOL 118 06/17/2014   TRIG 96 06/17/2014   HDL 37* 06/17/2014   LDLCALC 62 06/17/2014   ALT 12* 06/22/2014   AST 14* 06/22/2014   NA 138 11/02/2014   K 4.8 11/02/2014   CL 98 11/02/2014   CREATININE 0.83 11/02/2014   BUN 20 11/02/2014   CO2 22 11/02/2014   TSH 1.310 06/17/2014   INR 1.18 06/16/2014   HGBA1C 5.9* 06/17/2014    BNP (last 3 results) No results for input(s): BNP in the last 8760 hours.  ProBNP (last 3 results) No results for input(s): PROBNP in the last 8760 hours.   Other Studies Reviewed Today:  Echo Study Conclusions from 05/2014  - Left ventricle: The cavity size was normal. Wall thickness was normal. Systolic function was normal. The estimated ejection fraction was in the range of 55% to 60%. Doppler parameters are consistent with abnormal left ventricular relaxation (grade 1 diastolic dysfunction). - Aortic valve: Severely calcified non coronary cusp. There was mild stenosis. There was mild to moderate regurgitation. Valve area (VTI): 1.56 cm^2. Valve area (Vmax): 1.26 cm^2. Valve area (Vmean): 1.39 cm^2. - Mitral valve: Calcified annulus. Moderately thickened leaflets . - Left atrium:  The atrium was moderately dilated. - Atrial septum: No defect or patent foramen ovale was identified.  Assessment/Plan: 1. PAF - on amiodarone - No change in current regimen. Recheck surveillance labs today.   2. Prior fall - with SAH/SDH - now with generalized failure to thrive.   3. CAD - managed medically - no chest pain reported.   4. Valvular heart disease   5. Probable stroke - she is certainly at high risk for stroke - but has had bleeding with Eliquis. I agree with NO anticoagulation. Would focus on keeping her safe and comfortable.   Current medicines are reviewed with the patient today.  The patient does not have concerns regarding medicines other than what has been noted above.  The following changes have been made:  See above.  Labs/ tests ordered today include:   No orders of the defined types were placed in this encounter.     Disposition:   FU with me in 6  months.   Patient is agreeable to this plan and will call if any problems develop in the interim.   Signed: Rosalio MacadamiaLori C. Glenmore Karl, RN, ANP-C 12/14/2014 8:57 AM  [facility] [address] [place], [place]  [address] Phone: [phone] Fax: [phone]

## 2014-12-14 NOTE — Telephone Encounter (Signed)
Mallory/Occupational Therapist, [facility] [phone] called to advise patient's had elevated BP reading today 162/60, no other symptoms.

## 2014-12-26 ENCOUNTER — Telehealth: Payer: Self-pay | Admitting: Neurology

## 2014-12-26 NOTE — Telephone Encounter (Signed)
[name] with [facility] is calling and would like to extend the patient's PT to 2 X week for 3 more weeks. Please call.

## 2014-12-26 NOTE — Telephone Encounter (Signed)
Rn call April HeckDanielle to give verbal order for extension for PT for 3 more weeks. [name] with [facility] stating patient will be discharge on around 01-12-15. Rn stated Dr. Roda ShuttersXu gave verbal order for therapy.

## 2014-12-27 NOTE — Telephone Encounter (Signed)
Rn gave verbal order for home health aide until PT ends 01-12-15. Dr. Roda ShuttersXu gave verbal order.

## 2014-12-27 NOTE — Telephone Encounter (Signed)
[name]/[facility] called requesting to extend home health aid for 2x wk for 3wks. [phone]

## 2015-01-03 ENCOUNTER — Other Ambulatory Visit (HOSPITAL_COMMUNITY): Payer: Self-pay | Admitting: Internal Medicine

## 2015-01-03 ENCOUNTER — Ambulatory Visit (HOSPITAL_COMMUNITY): Admission: RE | Admit: 2015-01-03 | Payer: Medicare Other | Source: Ambulatory Visit

## 2015-02-03 ENCOUNTER — Ambulatory Visit (HOSPITAL_COMMUNITY)
Admission: RE | Admit: 2015-02-03 | Discharge: 2015-02-03 | Disposition: A | Discharge status: Home / Self Care | Payer: Medicare Other | Source: Ambulatory Visit | Attending: Internal Medicine | Admitting: Internal Medicine

## 2015-02-08 ENCOUNTER — Ambulatory Visit: Payer: Medicare Other | Admitting: Neurology

## 2015-02-14 ENCOUNTER — Encounter (HOSPITAL_COMMUNITY): Payer: Medicare Other

## 2015-02-17 ENCOUNTER — Encounter (HOSPITAL_COMMUNITY): Payer: Self-pay

## 2015-02-17 ENCOUNTER — Ambulatory Visit (HOSPITAL_COMMUNITY)
Admission: RE | Admit: 2015-02-17 | Discharge: 2015-02-17 | Disposition: A | Discharge status: Home / Self Care | Payer: Medicare Other | Source: Ambulatory Visit | Attending: Internal Medicine | Admitting: Internal Medicine

## 2015-02-17 DIAGNOSIS — M810 Age-related osteoporosis without current pathological fracture: Secondary | ICD-10-CM | POA: Insufficient documentation

## 2015-02-17 DIAGNOSIS — E208 Other hypoparathyroidism: Secondary | ICD-10-CM | POA: Diagnosis not present

## 2015-02-17 MED ORDER — DENOSUMAB 60 MG/ML ~~LOC~~ SOLN
60.0000 mg | Freq: Once | SUBCUTANEOUS | Status: AC
  Administered 2015-02-17: 60 mg via SUBCUTANEOUS
  Filled 2015-02-17: qty 1

## 2015-02-17 NOTE — Discharge Instructions (Signed)
PROLIA °Denosumab injection °What is this medicine? °DENOSUMAB (den oh sue mab) slows bone breakdown. Prolia is used to treat osteoporosis in women after menopause and in men. Xgeva is used to prevent bone fractures and other bone problems caused by cancer bone metastases. Xgeva is also used to treat giant cell tumor of the bone. °This medicine may be used for other purposes; ask your health care provider or pharmacist if you have questions. °What should I tell my health care provider before I take this medicine? °They need to know if you have any of these conditions: °-dental disease °-eczema °-infection or history of infections °-kidney disease or on dialysis °-low blood calcium or vitamin D °-malabsorption syndrome °-scheduled to have surgery or tooth extraction °-taking medicine that contains denosumab °-thyroid or parathyroid disease °-an unusual reaction to denosumab, other medicines, foods, dyes, or preservatives °-pregnant or trying to get pregnant °-breast-feeding °How should I use this medicine? °This medicine is for injection under the skin. It is given by a health care professional in a hospital or clinic setting. °If you are getting Prolia, a special MedGuide will be given to you by the pharmacist with each prescription and refill. Be sure to read this information carefully each time. °For Prolia, talk to your pediatrician regarding the use of this medicine in children. Special care may be needed. For Xgeva, talk to your pediatrician regarding the use of this medicine in children. While this drug may be prescribed for children as young as 13 years for selected conditions, precautions do apply. °Overdosage: If you think you have taken too much of this medicine contact a poison control center or emergency room at once. °NOTE: This medicine is only for you. Do not share this medicine with others. °What if I miss a dose? °It is important not to miss your dose. Call your doctor or health care professional if  you are unable to keep an appointment. °What may interact with this medicine? °Do not take this medicine with any of the following medications: °-other medicines containing denosumab °This medicine may also interact with the following medications: °-medicines that suppress the immune system °-medicines that treat cancer °-steroid medicines like prednisone or cortisone °This list may not describe all possible interactions. Give your health care provider a list of all the medicines, herbs, non-prescription drugs, or dietary supplements you use. Also tell them if you smoke, drink alcohol, or use illegal drugs. Some items may interact with your medicine. °What should I watch for while using this medicine? °Visit your doctor or health care professional for regular checks on your progress. Your doctor or health care professional may order blood tests and other tests to see how you are doing. °Call your doctor or health care professional if you get a cold or other infection while receiving this medicine. Do not treat yourself. This medicine may decrease your body's ability to fight infection. °You should make sure you get enough calcium and vitamin D while you are taking this medicine, unless your doctor tells you not to. Discuss the foods you eat and the vitamins you take with your health care professional. °See your dentist regularly. Brush and floss your teeth as directed. Before you have any dental work done, tell your dentist you are receiving this medicine. °Do not become pregnant while taking this medicine or for 5 months after stopping it. Women should inform their doctor if they wish to become pregnant or think they might be pregnant. There is a potential for serious side   effects to an unborn child. Talk to your health care professional or pharmacist for more information. °What side effects may I notice from receiving this medicine? °Side effects that you should report to your doctor or health care professional as  soon as possible: °-allergic reactions like skin rash, itching or hives, swelling of the face, lips, or tongue °-breathing problems °-chest pain °-fast, irregular heartbeat °-feeling faint or lightheaded, falls °-fever, chills, or any other sign of infection °-muscle spasms, tightening, or twitches °-numbness or tingling °-skin blisters or bumps, or is dry, peels, or red °-slow healing or unexplained pain in the mouth or jaw °-unusual bleeding or bruising °Side effects that usually do not require medical attention (Report these to your doctor or health care professional if they continue or are bothersome.): °-muscle pain °-stomach upset, gas °This list may not describe all possible side effects. Call your doctor for medical advice about side effects. You may report side effects to FDA at 1-800-FDA-1088. °Where should I keep my medicine? °This medicine is only given in a clinic, doctor's office, or other health care setting and will not be stored at home. °NOTE: This sheet is a summary. It may not cover all possible information. If you have questions about this medicine, talk to your doctor, pharmacist, or health care provider. °  °© 2016, Elsevier/Gold Standard. (2011-07-15 12:37:47) °Osteoporosis °Osteoporosis is the thinning and loss of density in the bones. Osteoporosis makes the bones more brittle, fragile, and likely to break (fracture). Over time, osteoporosis can cause the bones to become so weak that they fracture after a simple fall. The bones most likely to fracture are the bones in the hip, wrist, and spine. °CAUSES  °The exact cause is not known. °RISK FACTORS °Anyone can develop osteoporosis. You may be at greater risk if you have a family history of the condition or have poor nutrition. You may also have a higher risk if you are:  °· Female.   °· 50 years old or older. °· A smoker. °· Not physically active.   °· White or Asian. °· Slender. °SIGNS AND SYMPTOMS  °A fracture might be the first sign of the  disease, especially if it results from a fall or injury that would not usually cause a bone to break. Other signs and symptoms include:  °· Low back and neck pain. °· Stooped posture. °· Height loss. °DIAGNOSIS  °To make a diagnosis, your health care provider may: °· Take a medical history. °· Perform a physical exam. °· Order tests, such as: °¨ A bone mineral density test. °¨ A dual-energy X-ray absorptiometry test. °TREATMENT  °The goal of osteoporosis treatment is to strengthen your bones to reduce your risk of a fracture. Treatment may involve: °· Making lifestyle changes, such as: °¨ Eating a diet rich in calcium. °¨ Doing weight-bearing and muscle-strengthening exercises. °¨ Stopping tobacco use. °¨ Limiting alcohol intake. °· Taking medicine to slow the process of bone loss or to increase bone density. °· Monitoring your levels of calcium and vitamin D. °HOME CARE INSTRUCTIONS °· Include calcium and vitamin D in your diet. Calcium is important for bone health, and vitamin D helps the body absorb calcium. °· Perform weight-bearing and muscle-strengthening exercises as directed by your health care provider. °· Do not use any tobacco products, including cigarettes, chewing tobacco, and electronic cigarettes. If you need help quitting, ask your health care provider. °· Limit your alcohol intake. °· Take medicines only as directed by your health care provider. °· Keep all   follow-up visits as directed by your health care provider. This is important. °· Take precautions at home to lower your risk of falling, such as: °¨ Keeping rooms well lit and clutter free. °¨ Installing safety rails on stairs. °¨ Using rubber mats in the bathroom and other areas that are often wet or slippery. °SEEK IMMEDIATE MEDICAL CARE IF:  °You fall or injure yourself.  °  °This information is not intended to replace advice given to you by your health care provider. Make sure you discuss any questions you have with your health care  provider. °  °Document Released: 10/24/2004 Document Revised: 02/04/2014 Document Reviewed: 06/24/2013 °Elsevier Interactive Patient Education ©2016 [employer]vier Inc. ° ° °

## 2015-02-17 NOTE — Progress Notes (Signed)
Pt arries today for her every 6 month PROLIA. Pt states she takes dietary calcium in the form of milk and ice cream. Uneventful injection of Prolia. Discharged via w/c accompanied by daughter to elevator

## 2015-06-07 ENCOUNTER — Encounter: Payer: Self-pay | Admitting: Nurse Practitioner

## 2015-06-07 ENCOUNTER — Ambulatory Visit (INDEPENDENT_AMBULATORY_CARE_PROVIDER_SITE_OTHER): Payer: Medicare Other | Admitting: Nurse Practitioner

## 2015-06-07 VITALS — BP 138/56 | HR 60 | Ht 66.0 in | Wt 106.6 lb

## 2015-06-07 DIAGNOSIS — I259 Chronic ischemic heart disease, unspecified: Secondary | ICD-10-CM | POA: Diagnosis not present

## 2015-06-07 DIAGNOSIS — I38 Endocarditis, valve unspecified: Secondary | ICD-10-CM

## 2015-06-07 DIAGNOSIS — Z79899 Other long term (current) drug therapy: Secondary | ICD-10-CM | POA: Diagnosis not present

## 2015-06-07 DIAGNOSIS — I480 Paroxysmal atrial fibrillation: Secondary | ICD-10-CM | POA: Diagnosis not present

## 2015-06-07 LAB — BASIC METABOLIC PANEL
BUN: 19 mg/dL (ref 7–25)
CO2: 28 mmol/L (ref 20–31)
Calcium: 9 mg/dL (ref 8.6–10.4)
Chloride: 101 mmol/L (ref 98–110)
Creat: 0.85 mg/dL (ref 0.60–0.88)
Glucose, Bld: 92 mg/dL (ref 65–99)
Potassium: 4.5 mmol/L (ref 3.5–5.3)
Sodium: 137 mmol/L (ref 135–146)

## 2015-06-07 LAB — CBC
HCT: 33.7 % — ABNORMAL LOW (ref 35.0–45.0)
Hemoglobin: 11 g/dL — ABNORMAL LOW (ref 11.7–15.5)
MCH: 29.6 pg (ref 27.0–33.0)
MCHC: 32.6 g/dL (ref 32.0–36.0)
MCV: 90.8 fL (ref 80.0–100.0)
MPV: 10.6 fL (ref 7.5–12.5)
Platelets: 229 10*3/uL (ref 140–400)
RBC: 3.71 MIL/uL — ABNORMAL LOW (ref 3.80–5.10)
RDW: 14.3 % (ref 11.0–15.0)
WBC: 8.4 10*3/uL (ref 3.8–10.8)

## 2015-06-07 LAB — TSH: TSH: 2.13 mIU/L

## 2015-06-07 LAB — HEPATIC FUNCTION PANEL
ALT: 12 U/L (ref 6–29)
AST: 13 U/L (ref 10–35)
Albumin: 3.9 g/dL (ref 3.6–5.1)
Alkaline Phosphatase: 61 U/L (ref 33–130)
Bilirubin, Direct: 0.1 mg/dL (ref <=0.2)
Indirect Bilirubin: 0.3 mg/dL (ref 0.2–1.2)
Total Bilirubin: 0.4 mg/dL (ref 0.2–1.2)
Total Protein: 6.9 g/dL (ref 6.1–8.1)

## 2015-06-07 NOTE — Patient Instructions (Addendum)
We will be checking the following labs today - BMET, CBC, HPF and TSH   Medication Instructions:    Continue with your current medicines.     Testing/Procedures To Be Arranged:  N/A  Follow-Up:   See me in 4 months with EKG    Other Special Instructions:   N/A    If you need a refill on your cardiac medications before your next appointment, please call your pharmacy.   Call the [facility] office at [phone] if you have any questions, problems or concerns.

## 2015-06-07 NOTE — Progress Notes (Signed)
CARDIOLOGY OFFICE NOTE  Date:  06/07/2015    [name] Date of Birth: 1920-07-18 Medical Record #[id number]  PCP:  Ezequiel Kayser, MD  Cardiologist:  [facility]    Chief Complaint  Patient presents with  . Atrial Fibrillation  . Coronary Artery Disease  . Cardiac Valve Problem    Follow up visit - seen for Dr. Antoine Poche    History of Present Illness: [name] is a 82 y.o. female who presents today for a 6 month check. Seen for Dr. Antoine Poche. She is a former patient of Dr. Ronnald Nian.   She has a history of paroxysmal atrial fibrillation, flash pulmonary edema associated with rapid atrial fibrillation, and mild to moderate aortic insufficiency on 2-D echo in 2010. She had moderate coronary artery disease on cath in 2010 with 60% ostial stenosis of the RCA and 60-70% mid LAD with an EF of 45%. She has been managed medically. Other issues include PUD, IBS, and carotid disease. Sees Dr. Waynard Edwards for primary care.   Seen back in May of 2015 she had been noted to have positive hemoccult cards and did not wish to pursue any workup. Her dose of Diovan had been cut back due to low BP and dizziness. Prior EKG has shown sinus but with prolonged QT at 502 and QTc at 513 - she was also on Celexa - she is on less amiodarone at 100 mg a day.   She was admitted on 06/16/14 after a fall on her back porch. No LOC but patient with posterior scalp laceration and left facial droop noted by family. She was evaluated in ED and found to have right thalamic hemorrhage with additional small hemorrhagic foci around right thalamic hematoma, parafalcine hematoma, trace SAH along right falx andsmall SDH left temporal lobe. Serial CCT revealed stable hematoma. 2D echo done revealing EF 55-60% with severe calcification AV with mild to moderate regurgitation and moderately calcified MV. Rehab therapy initiated but she ended up being transferred to [facility] on June 8.  Had a spell in September of where she was  not able to stand - more facial droop and right sided weakness. Did not wish to seek care. She had had resolution of ICH, SDH and then was put on low dose Eliquis - but this caused GI bleeding and was discontinued. She remains off of all anticoagulation. I last saw her back in September - she was pretty weak but back at home. Wanting only comfort measures.   Comes back today. Here with her daughter. She remains weak and has no energy. Says she is "not as good as she would like to be" but attributes it to her age.  No more falls. No spells that sound like a stroke but daughter feels like she has mini strokes. Still lives alone. Has mobile meals. Feet are swelling a little more but she is clearly adding salt to all her food. Has issues with no taste and no smell. Weight is up 3 pounds. Not really short of breath. No pain anywhere. She is not steady on her feet.  Past Medical History  Diagnosis Date  . Atrial fibrillation with rapid ventricular response [facility]) Feb 2010  . Hypertension   . Flash pulmonary edema [facility]) Feb 2010    Due to atrial fib with RVR  . Aortic insufficiency Feb 2010    Mild to moderate per echo with normal EF  . Arthritis   . Hyperlipidemia   . Dementia   .  Vitamin D deficiency   . IBS (irritable bowel syndrome)   . PUD (peptic ulcer disease)   . Compression fracture   . Raynaud phenomenon   . CAD (coronary artery disease)     2010.  60% RCA, 60 - 70% LAD  . Carotid stenosis     40 - 59% bilateral  . Stroke [facility])     Past Surgical History  Procedure Laterality Date  . Cardiac catheterization  03/11/2008    EF 45%  . Appendectomy    . Transthoracic echocardiogram  03/06/2008    EF 55%     Medications: Current Outpatient Prescriptions  Medication Sig Dispense Refill  . acetaminophen (TYLENOL) 325 MG tablet Take 1-2 tablets (325-650 mg total) by mouth every 4 (four) hours as needed for mild pain.    [name] Kitchen. amiodarone (PACERONE) 200 MG tablet TAKE 1/2 TABLET BY MOUTH  EVERY DAY 30 tablet 6  . amLODipine (NORVASC) 5 MG tablet     . atorvastatin (LIPITOR) 20 MG tablet Take 20 mg by mouth daily.    . citalopram (CELEXA) 20 MG tablet Take 20 mg by mouth daily.     [name] Kitchen. denosumab (PROLIA) 60 MG/ML SOLN injection Inject 60 mg into the skin every 6 (six) months. Administer in upper arm, thigh, or abdomen    . feeding supplement, ENSURE ENLIVE, (ENSURE ENLIVE) LIQD Take 237 mLs by mouth 2 (two) times daily between meals. 237 mL 12  . pantoprazole (PROTONIX) 40 MG tablet Take 1 tablet (40 mg total) by mouth daily.    . traZODone (DESYREL) 50 MG tablet Take 0.5-1 tablets (25-50 mg total) by mouth at bedtime as needed for sleep. (Patient taking differently: Take 25 mg by mouth at bedtime. )    . valsartan (DIOVAN) 160 MG tablet one table every day    . Vitamin D, Ergocalciferol, (DRISDOL) 50000 UNITS CAPS Take 50,000 Units by mouth every 7 (seven) days.       No current facility-administered medications for this visit.    Allergies: Allergies  Allergen Reactions  . Aspirin     REACTION: Intolerance  . Zoledronic Acid     REACTION: Intolerance    Social History: The patient  reports that she has never smoked. She has never used smokeless tobacco. She reports that she does not drink alcohol or use illicit drugs.   Family History: The patient's family history includes Cancer in her mother; Heart attack in her father; Stroke in her mother.   Review of Systems: Please see the history of present illness.   Otherwise, the review of systems is positive for none.   All other systems are reviewed and negative.   Physical Exam: VS:  BP 138/56 mmHg  Pulse 60  Ht 5\' 6"  (1.676 m)  Wt 106 lb 9.6 oz (48.353 kg)  BMI 17.21 kg/m2 .  BMI Body mass index is 17.21 kg/(m^2).  Wt Readings from Last 3 Encounters:  06/07/15 106 lb 9.6 oz (48.353 kg)  02/17/15 103 lb 9.6 oz (46.993 kg)  12/14/14 104 lb 12.8 oz (47.537 kg)    General: Elderly female who is alert and in no  acute distress. Color is pale today.  HEENT: Normal. Neck: Supple, no JVD, carotid bruits, or masses noted.  Cardiac: Regular rate and rhythm. Outflow murmur noted.  Just trace edema of the feet today.  Respiratory:  Lungs are clear to auscultation bilaterally with normal work of breathing.  GI: Soft and nontender.  MS: No deformity or atrophy.  Gait not tested.  Skin: Warm and dry. Color is pale Neuro:  Strength and sensation are intact and no gross focal deficits noted.  Psych: Alert, appropriate and with normal affect.   LABORATORY DATA:  EKG:  EKG is not ordered today.  Lab Results  Component Value Date   WBC 7.1 12/14/2014   HGB 10.3* 12/14/2014   HCT 30.8* 12/14/2014   PLT 217 12/14/2014   GLUCOSE 83 12/14/2014   CHOL 118 06/17/2014   TRIG 96 06/17/2014   HDL 37* 06/17/2014   LDLCALC 62 06/17/2014   ALT 13 12/14/2014   AST 14 12/14/2014   NA 137 12/14/2014   K 4.4 12/14/2014   CL 102 12/14/2014   CREATININE 0.84 12/14/2014   BUN 17 12/14/2014   CO2 29 12/14/2014   TSH 1.829 12/14/2014   INR 1.18 06/16/2014   HGBA1C 5.9* 06/17/2014    BNP (last 3 results) No results for input(s): BNP in the last 8760 hours.  ProBNP (last 3 results) No results for input(s): PROBNP in the last 8760 hours.   Other Studies Reviewed Today:  Echo Study Conclusions from 05/2014  - Left ventricle: The cavity size was normal. Wall thickness was normal. Systolic function was normal. The estimated ejection fraction was in the range of 55% to 60%. Doppler parameters are consistent with abnormal left ventricular relaxation (grade 1 diastolic dysfunction). - Aortic valve: Severely calcified non coronary cusp. There was mild stenosis. There was mild to moderate regurgitation. Valve area (VTI): 1.56 cm^2. Valve area (Vmax): 1.26 cm^2. Valve area (Vmean): 1.39 cm^2. - Mitral valve: Calcified annulus. Moderately thickened leaflets . - Left atrium: The atrium was moderately  dilated. - Atrial septum: No defect or patent foramen ovale was identified.  Assessment/Plan: 1. PAF - on amiodarone - No change in current regimen. Recheck surveillance labs today. Remains in NSR by exam.   2. Prior fall - with SAH/SDH - now with generalized failure to thrive. She has opted for a conservative approach with comfort care.   3. CAD - managed medically - no chest pain reported.   4. Valvular heart disease   5. Probable stroke - she is certainly at high risk for stroke - but has had bleeding with Eliquis. I agree with NO anticoagulation. Would focus on keeping her safe and comfortable.   6. Lower extremity edema - most likely related to salt. I would not want her on diuretics - I think she would end up rushing to get to the BR and would fall. For now, would manage conservatively. EF is normal by echo last year.   Current medicines are reviewed with the patient today.  The patient does not have concerns regarding medicines other than what has been noted above.  The following changes have been made:  See above.  Labs/ tests ordered today include:    Orders Placed This Encounter  Procedures  . Basic metabolic panel  . CBC  . Hepatic function panel  . TSH     Disposition:   FU with me in 4 months.   Patient is agreeable to this plan and will call if any problems develop in the interim.   Signed: Rosalio Macadamia, RN, ANP-C 06/07/2015 10:22 AM  [facility] [address] [place], [place]  [address] Phone: [phone] Fax: [phone]

## 2015-08-21 ENCOUNTER — Ambulatory Visit (HOSPITAL_COMMUNITY)
Admission: RE | Admit: 2015-08-21 | Discharge: 2015-08-21 | Disposition: A | Discharge status: Home / Self Care | Payer: Medicare Other | Source: Ambulatory Visit | Attending: Internal Medicine | Admitting: Internal Medicine

## 2015-08-21 ENCOUNTER — Encounter (HOSPITAL_COMMUNITY): Payer: Self-pay

## 2015-08-21 DIAGNOSIS — M810 Age-related osteoporosis without current pathological fracture: Secondary | ICD-10-CM | POA: Diagnosis present

## 2015-08-21 MED ORDER — DENOSUMAB 60 MG/ML ~~LOC~~ SOLN
60.0000 mg | Freq: Once | SUBCUTANEOUS | Status: AC
  Administered 2015-08-21: 60 mg via SUBCUTANEOUS
  Filled 2015-08-21: qty 1

## 2015-08-21 NOTE — Discharge Instructions (Signed)
Prolia °Denosumab injection °What is this medicine? °DENOSUMAB (den oh sue mab) slows bone breakdown. Prolia is used to treat osteoporosis in women after menopause and in men. Xgeva is used to prevent bone fractures and other bone problems caused by cancer bone metastases. Xgeva is also used to treat giant cell tumor of the bone. °This medicine may be used for other purposes; ask your health care provider or pharmacist if you have questions. °What should I tell my health care provider before I take this medicine? °They need to know if you have any of these conditions: °-dental disease °-eczema °-infection or history of infections °-kidney disease or on dialysis °-low blood calcium or vitamin D °-malabsorption syndrome °-scheduled to have surgery or tooth extraction °-taking medicine that contains denosumab °-thyroid or parathyroid disease °-an unusual reaction to denosumab, other medicines, foods, dyes, or preservatives °-pregnant or trying to get pregnant °-breast-feeding °How should I use this medicine? °This medicine is for injection under the skin. It is given by a health care professional in a hospital or clinic setting. °If you are getting Prolia, a special MedGuide will be given to you by the pharmacist with each prescription and refill. Be sure to read this information carefully each time. °For Prolia, talk to your pediatrician regarding the use of this medicine in children. Special care may be needed. For Xgeva, talk to your pediatrician regarding the use of this medicine in children. While this drug may be prescribed for children as young as 13 years for selected conditions, precautions do apply. °Overdosage: If you think you have taken too much of this medicine contact a poison control center or emergency room at once. °NOTE: This medicine is only for you. Do not share this medicine with others. °What if I miss a dose? °It is important not to miss your dose. Call your doctor or health care professional  if you are unable to keep an appointment. °What may interact with this medicine? °Do not take this medicine with any of the following medications: °-other medicines containing denosumab °This medicine may also interact with the following medications: °-medicines that suppress the immune system °-medicines that treat cancer °-steroid medicines like prednisone or cortisone °This list may not describe all possible interactions. Give your health care provider a list of all the medicines, herbs, non-prescription drugs, or dietary supplements you use. Also tell them if you smoke, drink alcohol, or use illegal drugs. Some items may interact with your medicine. °What should I watch for while using this medicine? °Visit your doctor or health care professional for regular checks on your progress. Your doctor or health care professional may order blood tests and other tests to see how you are doing. °Call your doctor or health care professional if you get a cold or other infection while receiving this medicine. Do not treat yourself. This medicine may decrease your body's ability to fight infection. °You should make sure you get enough calcium and vitamin D while you are taking this medicine, unless your doctor tells you not to. Discuss the foods you eat and the vitamins you take with your health care professional. °See your dentist regularly. Brush and floss your teeth as directed. Before you have any dental work done, tell your dentist you are receiving this medicine. °Do not become pregnant while taking this medicine or for 5 months after stopping it. Women should inform their doctor if they wish to become pregnant or think they might be pregnant. There is a potential for serious side   effects to an unborn child. Talk to your health care professional or pharmacist for more information. °What side effects may I notice from receiving this medicine? °Side effects that you should report to your doctor or health care professional  as soon as possible: °-allergic reactions like skin rash, itching or hives, swelling of the face, lips, or tongue °-breathing problems °-chest pain °-fast, irregular heartbeat °-feeling faint or lightheaded, falls °-fever, chills, or any other sign of infection °-muscle spasms, tightening, or twitches °-numbness or tingling °-skin blisters or bumps, or is dry, peels, or red °-slow healing or unexplained pain in the mouth or jaw °-unusual bleeding or bruising °Side effects that usually do not require medical attention (Report these to your doctor or health care professional if they continue or are bothersome.): °-muscle pain °-stomach upset, gas °This list may not describe all possible side effects. Call your doctor for medical advice about side effects. You may report side effects to FDA at 1-800-FDA-1088. °Where should I keep my medicine? °This medicine is only given in a clinic, doctor's office, or other health care setting and will not be stored at home. °NOTE: This sheet is a summary. It may not cover all possible information. If you have questions about this medicine, talk to your doctor, pharmacist, or health care provider. °  °© 2016, Elsevier/[employer] Standard. (2011-07-15 12:37:47) ° °

## 2015-09-19 ENCOUNTER — Encounter: Payer: Self-pay | Admitting: Nurse Practitioner

## 2015-10-04 ENCOUNTER — Ambulatory Visit: Payer: Medicare Other | Admitting: Nurse Practitioner

## 2015-10-31 ENCOUNTER — Ambulatory Visit (INDEPENDENT_AMBULATORY_CARE_PROVIDER_SITE_OTHER): Payer: Medicare Other | Admitting: Nurse Practitioner

## 2015-10-31 ENCOUNTER — Encounter: Payer: Self-pay | Admitting: Nurse Practitioner

## 2015-10-31 ENCOUNTER — Encounter (INDEPENDENT_AMBULATORY_CARE_PROVIDER_SITE_OTHER): Payer: Self-pay

## 2015-10-31 VITALS — BP 150/80 | HR 59 | Ht 66.0 in | Wt 104.1 lb

## 2015-10-31 DIAGNOSIS — Z79899 Other long term (current) drug therapy: Secondary | ICD-10-CM

## 2015-10-31 DIAGNOSIS — I480 Paroxysmal atrial fibrillation: Secondary | ICD-10-CM | POA: Diagnosis not present

## 2015-10-31 LAB — TSH: TSH: 1.89 mIU/L

## 2015-10-31 LAB — CBC
HCT: 32.7 % — ABNORMAL LOW (ref 35.0–45.0)
Hemoglobin: 10.6 g/dL — ABNORMAL LOW (ref 11.7–15.5)
MCH: 29.6 pg (ref 27.0–33.0)
MCHC: 32.4 g/dL (ref 32.0–36.0)
MCV: 91.3 fL (ref 80.0–100.0)
MPV: 10.3 fL (ref 7.5–12.5)
Platelets: 223 10*3/uL (ref 140–400)
RBC: 3.58 MIL/uL — ABNORMAL LOW (ref 3.80–5.10)
RDW: 14.2 % (ref 11.0–15.0)
WBC: 7.9 10*3/uL (ref 3.8–10.8)

## 2015-10-31 NOTE — Progress Notes (Signed)
CARDIOLOGY OFFICE NOTE  Date:  10/31/2015    [name] Date of Birth: 1921-01-25 Medical Record #[id number]  PCP:  April Kayser, MD  Cardiologist:  [name]  Chief Complaint  Patient presents with  . Atrial Fibrillation    5 month check - seen for [name]    History of Present Illness: [name] is a 82 y.o. female who presents today for a 5 month check. Seen for [name]. She is a former patient of [name].   She has a history of paroxysmal atrial fibrillation, flash pulmonary edema associated with rapid atrial fibrillation, and mild to moderate aortic insufficiency on 2-D echo in 2010. She had moderate coronary artery disease on cath in 2010 with 60% ostial stenosis of the RCA and 60-70% mid LAD with an EF of 45%. She has been managed medically. Other issues include PUD, IBS, and carotid disease. Sees [name] for primary care.   Seen back in May of 2015 she had been noted to have positive hemoccult cards and did not wish to pursue any workup. Her dose of Diovan had been cut back due to low BP and dizziness. Prior EKG has shown sinus but with prolonged QT at 502 and QTc at 513 - she was also on Celexa - she is on less amiodarone at 100 mg a day.   She was admitted on 06/16/14 after a fall on her back porch. No LOC but patient with posterior scalp laceration and left facial droop noted by family. She was evaluated in ED and found to have right thalamic hemorrhage with additional small hemorrhagic foci around right thalamic hematoma, parafalcine hematoma, trace SAH along right falx andsmall SDH left temporal lobe. Serial CCT revealed stable hematoma. 2D echo done revealing EF 55-60% with severe calcification AV with mild to moderate regurgitation and moderately calcified MV. Rehab therapy initiated but she ended up being transferred to [facility] on June 8.  Had a spell in September of where she was not able to stand - more facial droop and  right sided weakness. Did not wish to seek care. She had had resolution of ICH, SDH and then was put on low dose Eliquis - but this caused GI bleeding and was discontinued. She remains off of all anticoagulation. I saw her back last September - she was pretty weak but back at home. Wanting only comfort measures. Last seen by me back in May - still pretty weak but holding her own.   Comes back today. Here with her daughter. Daughter tells me that [name] does not feel she will live much longer - has made her funeral arrangements, written her obituary, etc. Getting harder to get out and about. Daughter is pretty fatigued with being the caregiver - especially in her mom's house that is 39 years old. She is weak. Not doing very much but still able to make her own breakfast and do her laundry. No chest pain. Gets short of breath pretty easily. No falls. Balance is unsteady but she is using her walker.   Past Medical History:  Diagnosis Date  . Aortic insufficiency Feb 2010   Mild to moderate per echo with normal EF  . Arthritis   . Atrial fibrillation with rapid ventricular response [facility]) Feb 2010  . CAD (coronary artery disease)    2010.  60% RCA, 60 - 70% LAD  . Carotid stenosis    40 - 59% bilateral  . Compression fracture   .  Dementia   . Flash pulmonary edema [facility]) Feb 2010   Due to atrial fib with RVR  . Hyperlipidemia   . Hypertension   . IBS (irritable bowel syndrome)   . PUD (peptic ulcer disease)   . Raynaud phenomenon   . Stroke (HCC)   . Vitamin D deficiency     Past Surgical History:  Procedure Laterality Date  . APPENDECTOMY    . CARDIAC CATHETERIZATION  03/11/2008   EF 45%  . TRANSTHORACIC ECHOCARDIOGRAM  03/06/2008   EF 55%     Medications: Current Outpatient Prescriptions  Medication Sig Dispense Refill  . acetaminophen (TYLENOL) 325 MG tablet Take 1-2 tablets (325-650 mg total) by mouth every 4 (four) hours as needed for mild pain.    [name] Kitchen amiodarone (PACERONE) 200 MG  tablet TAKE 1/2 TABLET BY MOUTH EVERY DAY 30 tablet 6  . amLODipine (NORVASC) 5 MG tablet     . atorvastatin (LIPITOR) 20 MG tablet Take 20 mg by mouth daily.    . citalopram (CELEXA) 20 MG tablet Take 20 mg by mouth daily.     [name] Kitchen denosumab (PROLIA) 60 MG/ML SOLN injection Inject 60 mg into the skin every 6 (six) months. Administer in upper arm, thigh, or abdomen    . feeding supplement, ENSURE ENLIVE, (ENSURE ENLIVE) LIQD Take 237 mLs by mouth 2 (two) times daily between meals. 237 mL 12  . pantoprazole (PROTONIX) 40 MG tablet Take 1 tablet (40 mg total) by mouth daily.    . traZODone (DESYREL) 50 MG tablet Take 0.5-1 tablets (25-50 mg total) by mouth at bedtime as needed for sleep. (Patient taking differently: Take 25 mg by mouth at bedtime. )    . valsartan (DIOVAN) 160 MG tablet one table every day    . Vitamin D, Ergocalciferol, (DRISDOL) 50000 UNITS CAPS Take 50,000 Units by mouth every 7 (seven) days.       No current facility-administered medications for this visit.     Allergies: Allergies  Allergen Reactions  . Aspirin     REACTION: Intolerance  . Zoledronic Acid     REACTION: Intolerance    Social History: The patient  reports that she has never smoked. She has never used smokeless tobacco. She reports that she does not drink alcohol or use drugs.   Family History: The patient's family history includes Cancer in her mother; Heart attack in her father; Stroke in her mother.   Review of Systems: Please see the history of present illness.   Otherwise, the review of systems is positive for none.   All other systems are reviewed and negative.   Physical Exam: VS:  BP (!) 150/80   Pulse (!) 59   Ht 5\' 6"  (1.676 m)   Wt 104 lb 1.9 oz (47.2 kg)   BMI 16.81 kg/m  .  BMI Body mass index is 16.81 kg/m.  Wt Readings from Last 3 Encounters:  10/31/15 104 lb 1.9 oz (47.2 kg)  06/07/15 106 lb 9.6 oz (48.4 kg)  02/17/15 103 lb 9.6 oz (47 kg)    General: Pleasant. Elderly  female who is quite thin but alert and in no acute distress.  HEENT: Normal. She is hard of hearing. Neck: Supple, no JVD, carotid bruits, or masses noted.  Cardiac: Regular rate and rhythm. No murmurs, rubs, or gallops. Trace edema.  Respiratory:  Lungs are clear to auscultation bilaterally with normal work of breathing.  GI: Soft and nontender.  MS: No deformity or atrophy. Gait not  tested Skin: Warm and dry. Color is normal.  Neuro:  Strength and sensation are intact and no gross focal deficits noted.  Psych: Alert, appropriate and with normal affect.   LABORATORY DATA:  EKG:  EKG is ordered today. This demonstrates NSR.  Lab Results  Component Value Date   WBC 8.4 06/07/2015   HGB 11.0 (L) 06/07/2015   HCT 33.7 (L) 06/07/2015   PLT 229 06/07/2015   GLUCOSE 92 06/07/2015   CHOL 118 06/17/2014   TRIG 96 06/17/2014   HDL 37 (L) 06/17/2014   LDLCALC 62 06/17/2014   ALT 12 06/07/2015   AST 13 06/07/2015   NA 137 06/07/2015   K 4.5 06/07/2015   CL 101 06/07/2015   CREATININE 0.85 06/07/2015   BUN 19 06/07/2015   CO2 28 06/07/2015   TSH 2.13 06/07/2015   INR 1.18 06/16/2014   HGBA1C 5.9 (H) 06/17/2014    BNP (last 3 results) No results for input(s): BNP in the last 8760 hours.  ProBNP (last 3 results) No results for input(s): PROBNP in the last 8760 hours.   Other Studies Reviewed Today:  Echo Study Conclusions from 05/2014  - Left ventricle: The cavity size was normal. Wall thickness was normal. Systolic function was normal. The estimated ejection fraction was in the range of 55% to 60%. Doppler parameters are consistent with abnormal left ventricular relaxation (grade 1 diastolic dysfunction). - Aortic valve: Severely calcified non coronary cusp. There was mild stenosis. There was mild to moderate regurgitation. Valve area (VTI): 1.56 cm^2. Valve area (Vmax): 1.26 cm^2. Valve area (Vmean): 1.39 cm^2. - Mitral valve: Calcified annulus. Moderately  thickened leaflets . - Left atrium: The atrium was moderately dilated. - Atrial septum: No defect or patent foramen ovale was identified.  Assessment/Plan: 1. PAF - on amiodarone - No change in current regimen. Recheck surveillance labs today. Remains in NSR by exam & EKG today.   2. Prior fall - with SAH/SDH - now with generalized failure to thrive. She has opted for a conservative approach with comfort care.   3. CAD - managed medically - no chest pain reported.   4. Valvular heart disease   5. Past probable stroke - she is certainly at high risk for stroke - but has had bleeding with Eliquis. I agree with NO anticoagulation. Would focus on keeping her safe and comfortable.   6. Lower extremity edema - most likely related to salt. I would not want her on diuretics - I think she would end up rushing to get to the BR and would fall. For now, would manage conservatively. EF is normal by echo last year.   7. Advanced age - she is clearly slowing down. Has made her funeral arrangements. Would focus on keeping her safe and comfortable.   Current medicines are reviewed with the patient today.  The patient does not have concerns regarding medicines other than what has been noted above.  The following changes have been made:  See above.  Labs/ tests ordered today include:    Orders Placed This Encounter  Procedures  . Basic metabolic panel  . CBC  . Hepatic function panel  . TSH  . EKG 12-Lead     Disposition:   FU with me in 5 months.   Patient is agreeable to this plan and will call if any problems develop in the interim.   Signed: Rosalio MacadamiaLori C. Loreena Valeri, RN, ANP-C 10/31/2015 2:08 PM  [facility] [address]  [place], [place]  [address] Phone: [phone] Fax: [phone]

## 2015-10-31 NOTE — Patient Instructions (Signed)
We will be checking the following labs today - BMET, CBC, TSH, HPF   Medication Instructions:    Continue with your current medicines.     Testing/Procedures To Be Arranged:  N/A  Follow-Up:   See me in 5 months    Other Special Instructions:   N/A    If you need a refill on your cardiac medications before your next appointment, please call your pharmacy.   Call the [facility] office at [phone] if you have any questions, problems or concerns.

## 2015-11-01 LAB — BASIC METABOLIC PANEL
BUN: 17 mg/dL (ref 7–25)
CO2: 28 mmol/L (ref 20–31)
Calcium: 8.9 mg/dL (ref 8.6–10.4)
Chloride: 102 mmol/L (ref 98–110)
Creat: 0.7 mg/dL (ref 0.60–0.88)
Glucose, Bld: 85 mg/dL (ref 65–99)
Potassium: 4.4 mmol/L (ref 3.5–5.3)
Sodium: 138 mmol/L (ref 135–146)

## 2015-11-01 LAB — HEPATIC FUNCTION PANEL
ALT: 12 U/L (ref 6–29)
AST: 16 U/L (ref 10–35)
Albumin: 3.9 g/dL (ref 3.6–5.1)
Alkaline Phosphatase: 61 U/L (ref 33–130)
Bilirubin, Direct: 0.1 mg/dL (ref <=0.2)
Indirect Bilirubin: 0.3 mg/dL (ref 0.2–1.2)
Total Bilirubin: 0.4 mg/dL (ref 0.2–1.2)
Total Protein: 6.9 g/dL (ref 6.1–8.1)

## 2016-02-23 ENCOUNTER — Ambulatory Visit (HOSPITAL_COMMUNITY)
Admission: RE | Admit: 2016-02-23 | Discharge: 2016-02-23 | Disposition: A | Discharge status: Home / Self Care | Payer: Medicare Other | Source: Ambulatory Visit | Attending: Internal Medicine | Admitting: Internal Medicine

## 2016-02-27 ENCOUNTER — Emergency Department (HOSPITAL_COMMUNITY)
Admission: EM | Admit: 2016-02-27 | Discharge: 2016-02-27 | Disposition: A | Discharge status: Home / Self Care | Payer: Medicare Other | Attending: Emergency Medicine | Admitting: Emergency Medicine

## 2016-02-27 ENCOUNTER — Encounter (HOSPITAL_COMMUNITY): Payer: Self-pay | Admitting: Emergency Medicine

## 2016-02-27 ENCOUNTER — Emergency Department (HOSPITAL_COMMUNITY): Payer: Medicare Other

## 2016-02-27 DIAGNOSIS — Z8673 Personal history of transient ischemic attack (TIA), and cerebral infarction without residual deficits: Secondary | ICD-10-CM | POA: Diagnosis not present

## 2016-02-27 DIAGNOSIS — S32592A Other specified fracture of left pubis, initial encounter for closed fracture: Secondary | ICD-10-CM

## 2016-02-27 DIAGNOSIS — Y929 Unspecified place or not applicable: Secondary | ICD-10-CM | POA: Insufficient documentation

## 2016-02-27 DIAGNOSIS — S32512A Fracture of superior rim of left pubis, initial encounter for closed fracture: Secondary | ICD-10-CM | POA: Diagnosis not present

## 2016-02-27 DIAGNOSIS — I10 Essential (primary) hypertension: Secondary | ICD-10-CM | POA: Diagnosis not present

## 2016-02-27 DIAGNOSIS — I2510 Atherosclerotic heart disease of native coronary artery without angina pectoris: Secondary | ICD-10-CM | POA: Insufficient documentation

## 2016-02-27 DIAGNOSIS — Z79899 Other long term (current) drug therapy: Secondary | ICD-10-CM | POA: Insufficient documentation

## 2016-02-27 DIAGNOSIS — S79912A Unspecified injury of left hip, initial encounter: Secondary | ICD-10-CM | POA: Diagnosis present

## 2016-02-27 DIAGNOSIS — Y939 Activity, unspecified: Secondary | ICD-10-CM | POA: Diagnosis not present

## 2016-02-27 DIAGNOSIS — W1830XA Fall on same level, unspecified, initial encounter: Secondary | ICD-10-CM | POA: Diagnosis not present

## 2016-02-27 DIAGNOSIS — Y999 Unspecified external cause status: Secondary | ICD-10-CM | POA: Insufficient documentation

## 2016-02-27 LAB — CBG MONITORING, ED: GLUCOSE-CAPILLARY: 87 mg/dL (ref 65–99)

## 2016-02-27 MED ORDER — ACETAMINOPHEN 325 MG PO TABS
650.0000 mg | ORAL_TABLET | Freq: Once | ORAL | Status: DC
  Filled 2016-02-27: qty 2

## 2016-02-27 MED ORDER — ACETAMINOPHEN 325 MG PO TABS: 325.0000 mg | ORAL_TABLET | Freq: Four times a day (QID) | ORAL | 0 refills | Status: AC | PRN

## 2016-02-27 MED ORDER — HYDROCODONE-ACETAMINOPHEN 5-325 MG PO TABS
0.5000 | ORAL_TABLET | Freq: Once | ORAL | Status: AC
  Administered 2016-02-27: 0.5 via ORAL
  Filled 2016-02-27: qty 1

## 2016-02-27 MED ORDER — HYDROCODONE-ACETAMINOPHEN 5-325 MG PO TABS: 0.5000 | ORAL_TABLET | Freq: Two times a day (BID) | ORAL | 0 refills | Status: DC | PRN

## 2016-02-27 NOTE — ED Notes (Signed)
When getting up PT Pain was at a 3. When walking PT the pain was at a 4. Pt did well ambulating in the room.

## 2016-02-27 NOTE — ED Notes (Signed)
Pt verbalizes understanding of discharge instructions. Ambulatory with walker to wheelchair and wheeled to waiting room to be transported home by daughter. NAD. A/o x4.

## 2016-02-27 NOTE — ED Triage Notes (Signed)
Pt to ER for evaluation of left hip pain after mechanical fall this morning on the porch when she bent over to grab the news papers. Daughter at bedside, reports stroke hx. Pt is a/o x4. Denies LOC, dizziness, weakness, etc. VSS.

## 2016-02-27 NOTE — ED Notes (Signed)
Patient undressed, in gown, on continuous pulse and blood pressure cuff; warm blankets given; visitors at bedside

## 2016-02-27 NOTE — ED Provider Notes (Signed)
[facility]-EMERGENCY DEPT Provider Note   CSN: [id number] Arrival date & time: 02/27/16  4098     History   Chief Complaint Chief Complaint  Patient presents with  . Hip Pain    HPI [name] is a 83 y.o. female.   Fall  This is a new problem. The current episode started less than 1 hour ago. The problem occurs constantly. The problem has not changed since onset.Pertinent negatives include no chest pain and no abdominal pain. Nothing aggravates the symptoms. Nothing relieves the symptoms. She has tried nothing for the symptoms.    Past Medical History:  Diagnosis Date  . Aortic insufficiency Feb 2010   Mild to moderate per echo with normal EF  . Arthritis   . Atrial fibrillation with rapid ventricular response [facility]) Feb 2010  . CAD (coronary artery disease)    2010.  60% RCA, 60 - 70% LAD  . Carotid stenosis    40 - 59% bilateral  . Compression fracture   . Dementia   . Flash pulmonary edema [facility]) Feb 2010   Due to atrial fib with RVR  . Hyperlipidemia   . Hypertension   . IBS (irritable bowel syndrome)   . PUD (peptic ulcer disease)   . Raynaud phenomenon   . Stroke (HCC)   . Vitamin D deficiency     Patient Active Problem List   Diagnosis Date Noted  . Pain in joint, shoulder region 11/02/2014  . Anemia due to chronic blood loss 11/02/2014  . Nontraumatic subcortical hemorrhage of right cerebral hemisphere (HCC) 11/02/2014  . Nontraumatic subcortical hemorrhage of cerebral hemisphere (HCC) 08/23/2014  . SAH (subarachnoid hemorrhage) (HCC) 08/23/2014  . Paroxysmal atrial fibrillation (HCC) 08/23/2014  . Essential hypertension 08/23/2014  . Hyperlipidemia 08/23/2014  . Mild TBI (HCC) 06/21/2014  . Traumatic brain injury (HCC)   . SDH (subdural hematoma) (HCC) 06/16/2014  . Nontraumatic hemorrhage of cerebral hemisphere (HCC)   . Hypertensive urgency   . CAD (coronary artery disease) of artery bypass graft 06/15/2012  . Carotid bruit present 06/15/2012  .  Raynaud phenomenon 02/08/2011  . PAF (paroxysmal atrial fibrillation) (HCC) 10/09/2010  . High risk medication use 10/09/2010  . Aortic valve disease 10/09/2010  . HYPERLIPIDEMIA 06/10/2007  . HYPERTENSION 06/10/2007  . ALLERGIC RHINITIS 06/10/2007  . EMPHYSEMA 06/10/2007  . DYSPNEA 06/10/2007  . COUGH 06/10/2007    Past Surgical History:  Procedure Laterality Date  . APPENDECTOMY    . CARDIAC CATHETERIZATION  03/11/2008   EF 45%  . TRANSTHORACIC ECHOCARDIOGRAM  03/06/2008   EF 55%    OB History    No data available       Home Medications    Prior to Admission medications   Medication Sig Start Date End Date Taking? Authorizing Provider  acetaminophen (TYLENOL) 325 MG tablet Take 1-2 tablets (325-650 mg total) by mouth every 6 (six) hours as needed. 02/27/16 03/12/16  Marily Memos, MD  amiodarone (PACERONE) 200 MG tablet TAKE 1/2 TABLET BY MOUTH EVERY DAY 06/02/13   Rosalio Macadamia, NP  amLODipine (NORVASC) 5 MG tablet  09/09/14   Historical Provider, MD  atorvastatin (LIPITOR) 20 MG tablet Take 20 mg by mouth daily.    Historical Provider, MD  citalopram (CELEXA) 20 MG tablet Take 20 mg by mouth daily.     Historical Provider, MD  denosumab (PROLIA) 60 MG/ML SOLN injection Inject 60 mg into the skin every 6 (six) months. Administer in upper arm, thigh, or abdomen  Historical Provider, MD  feeding supplement, ENSURE ENLIVE, (ENSURE ENLIVE) LIQD Take 237 mLs by mouth 2 (two) times daily between meals. 07/06/14   Jacquelynn Cree, PA-C  HYDROcodone-acetaminophen (NORCO/VICODIN) 5-325 MG tablet Take 0.5-1 tablets by mouth every 12 (twelve) hours as needed for severe pain. 02/27/16   Marily Memos, MD  pantoprazole (PROTONIX) 40 MG tablet Take 1 tablet (40 mg total) by mouth daily. 07/06/14   Jacquelynn Cree, PA-C  traZODone (DESYREL) 50 MG tablet Take 0.5-1 tablets (25-50 mg total) by mouth at bedtime as needed for sleep. Patient taking differently: Take 25 mg by mouth at bedtime.  07/06/14    Jacquelynn Cree, PA-C  valsartan (DIOVAN) 160 MG tablet one table every day 09/19/11   Rollene Rotunda, MD  Vitamin D, Ergocalciferol, (DRISDOL) 50000 UNITS CAPS Take 50,000 Units by mouth every 7 (seven) days.      Historical Provider, MD    Family History Family History  Problem Relation Age of Onset  . Stroke Mother   . Cancer Mother   . Heart attack Father     Social History Social History  Substance Use Topics  . Smoking status: Never Smoker  . Smokeless tobacco: Never Used  . Alcohol use No     Allergies   Aspirin and Zoledronic acid   Review of Systems Review of Systems  Constitutional: Negative for chills and fever.  Respiratory: Negative for cough.   Cardiovascular: Negative for chest pain.  Gastrointestinal: Negative for abdominal pain.  All other systems reviewed and are negative.    Physical Exam Updated Vital Signs BP (!) 129/45   Pulse (!) 55   Temp 97.6 F (36.4 C) (Oral)   Resp 16   SpO2 96%   Physical Exam  Constitutional: She appears well-developed and well-nourished.  HENT:  Head: Normocephalic and atraumatic.  Eyes: Conjunctivae and EOM are normal.  Neck: Normal range of motion.  Cardiovascular: Normal rate and regular rhythm.   Pulmonary/Chest: No stridor. No respiratory distress. She exhibits no tenderness.  Abdominal: Soft. Bowel sounds are normal. She exhibits no distension.  Musculoskeletal: She exhibits tenderness (left pelvis). She exhibits no edema or deformity.  Neurological: She is alert.  Skin: Skin is warm and dry.  Nursing note and vitals reviewed.    ED Treatments / Results  Labs (all labs ordered are listed, but only abnormal results are displayed) Labs Reviewed  CBG MONITORING, ED    EKG  EKG Interpretation None       Radiology Dg Chest 2 View  Result Date: 02/27/2016 CLINICAL DATA:  Recent fall with left hip pain EXAM: CHEST  2 VIEW COMPARISON:  06/24/2014 FINDINGS: Lungs are well aerated bilaterally with  chronic calcific changes in the apices bilaterally. The cardiac shadow is stable. Mild scoliosis of the thoracic spine is again seen and stable. No focal infiltrate, effusion or pneumothorax is seen. No acute bony abnormality is noted. IMPRESSION: Chronic pleural and parenchymal calcifications in the apices bilaterally. No acute abnormality is seen. Electronically Signed   By: Alcide Clever M.D.   On: 02/27/2016 09:14   Dg Hip Unilat W Or Wo Pelvis 2-3 Views Left  Result Date: 02/27/2016 CLINICAL DATA:  Recent fall with left hip pain, initial encounter EXAM: DG HIP (WITH OR WITHOUT PELVIS) 2-3V LEFT COMPARISON:  None. FINDINGS: Proximal left femur is within normal limits. Irregularity is noted along the superior pubic ramus on the left consistent with an undisplaced fracture. No other focal abnormality is noted. No  soft tissue changes are seen. IMPRESSION: Left superior pubic ramus fracture Electronically Signed   By: Alcide CleverMark  Lukens M.D.   On: 02/27/2016 09:13    Procedures Procedures (including critical care time)  Medications Ordered in ED Medications  HYDROcodone-acetaminophen (NORCO/VICODIN) 5-325 MG per tablet 0.5 tablet (0.5 tablets Oral Given 02/27/16 0959)     Initial Impression / Assessment and Plan / ED Course  I have reviewed the triage vital signs and the nursing notes.  Pertinent labs & imaging results that were available during my care of the patient were reviewed by me and considered in my medical decision making (see chart for details).     Pubic ramus fracture after mechanical fall. Ambulates with minimal pain but will stil have CM coordinate some in-home care for her. otherwise stable for dc.   Final Clinical Impressions(s) / ED Diagnoses   Final diagnoses:  Closed fracture of ramus of left pubis, initial encounter [facility](HCC)    New Prescriptions Discharge Medication List as of 02/27/2016 10:59 AM    START taking these medications   Details  HYDROcodone-acetaminophen  (NORCO/VICODIN) 5-325 MG tablet Take 0.5-1 tablets by mouth every 12 (twelve) hours as needed for severe pain., Starting Tue 02/27/2016, Print         Marily MemosJason Endre Coutts, MD 02/27/16 [phone]

## 2016-02-27 NOTE — ED Notes (Signed)
Pt transporting to xray.  

## 2016-03-19 ENCOUNTER — Emergency Department (HOSPITAL_COMMUNITY): Payer: Medicare Other

## 2016-03-19 ENCOUNTER — Encounter (HOSPITAL_COMMUNITY): Payer: Self-pay | Admitting: Emergency Medicine

## 2016-03-19 ENCOUNTER — Inpatient Hospital Stay (HOSPITAL_COMMUNITY)
Admission: EM | Admit: 2016-03-19 | Discharge: 2016-03-24 | DRG: 193 | Disposition: A | Discharge status: Skilled Nursing Facility | Payer: Medicare Other | Attending: Internal Medicine | Admitting: Internal Medicine

## 2016-03-19 DIAGNOSIS — Y95 Nosocomial condition: Secondary | ICD-10-CM | POA: Diagnosis present

## 2016-03-19 DIAGNOSIS — D500 Iron deficiency anemia secondary to blood loss (chronic): Secondary | ICD-10-CM | POA: Diagnosis present

## 2016-03-19 DIAGNOSIS — J1000 Influenza due to other identified influenza virus with unspecified type of pneumonia: Principal | ICD-10-CM | POA: Diagnosis present

## 2016-03-19 DIAGNOSIS — K580 Irritable bowel syndrome with diarrhea: Secondary | ICD-10-CM | POA: Diagnosis present

## 2016-03-19 DIAGNOSIS — J440 Chronic obstructive pulmonary disease with acute lower respiratory infection: Secondary | ICD-10-CM | POA: Diagnosis present

## 2016-03-19 DIAGNOSIS — E559 Vitamin D deficiency, unspecified: Secondary | ICD-10-CM | POA: Diagnosis present

## 2016-03-19 DIAGNOSIS — Z515 Encounter for palliative care: Secondary | ICD-10-CM | POA: Diagnosis not present

## 2016-03-19 DIAGNOSIS — Z8711 Personal history of peptic ulcer disease: Secondary | ICD-10-CM | POA: Diagnosis not present

## 2016-03-19 DIAGNOSIS — E43 Unspecified severe protein-calorie malnutrition: Secondary | ICD-10-CM | POA: Insufficient documentation

## 2016-03-19 DIAGNOSIS — I25810 Atherosclerosis of coronary artery bypass graft(s) without angina pectoris: Secondary | ICD-10-CM | POA: Diagnosis present

## 2016-03-19 DIAGNOSIS — Z951 Presence of aortocoronary bypass graft: Secondary | ICD-10-CM | POA: Diagnosis not present

## 2016-03-19 DIAGNOSIS — R0602 Shortness of breath: Secondary | ICD-10-CM | POA: Diagnosis not present

## 2016-03-19 DIAGNOSIS — I10 Essential (primary) hypertension: Secondary | ICD-10-CM | POA: Diagnosis present

## 2016-03-19 DIAGNOSIS — I69354 Hemiplegia and hemiparesis following cerebral infarction affecting left non-dominant side: Secondary | ICD-10-CM

## 2016-03-19 DIAGNOSIS — J189 Pneumonia, unspecified organism: Secondary | ICD-10-CM | POA: Diagnosis present

## 2016-03-19 DIAGNOSIS — Z886 Allergy status to analgesic agent status: Secondary | ICD-10-CM

## 2016-03-19 DIAGNOSIS — R6510 Systemic inflammatory response syndrome (SIRS) of non-infectious origin without acute organ dysfunction: Secondary | ICD-10-CM | POA: Diagnosis present

## 2016-03-19 DIAGNOSIS — Z66 Do not resuscitate: Secondary | ICD-10-CM | POA: Diagnosis present

## 2016-03-19 DIAGNOSIS — H9190 Unspecified hearing loss, unspecified ear: Secondary | ICD-10-CM | POA: Diagnosis present

## 2016-03-19 DIAGNOSIS — I351 Nonrheumatic aortic (valve) insufficiency: Secondary | ICD-10-CM | POA: Diagnosis present

## 2016-03-19 DIAGNOSIS — J101 Influenza due to other identified influenza virus with other respiratory manifestations: Secondary | ICD-10-CM | POA: Diagnosis present

## 2016-03-19 DIAGNOSIS — N390 Urinary tract infection, site not specified: Secondary | ICD-10-CM | POA: Diagnosis present

## 2016-03-19 DIAGNOSIS — Z8249 Family history of ischemic heart disease and other diseases of the circulatory system: Secondary | ICD-10-CM | POA: Diagnosis not present

## 2016-03-19 DIAGNOSIS — I480 Paroxysmal atrial fibrillation: Secondary | ICD-10-CM | POA: Diagnosis present

## 2016-03-19 DIAGNOSIS — J9601 Acute respiratory failure with hypoxia: Secondary | ICD-10-CM | POA: Diagnosis present

## 2016-03-19 DIAGNOSIS — Z823 Family history of stroke: Secondary | ICD-10-CM | POA: Diagnosis not present

## 2016-03-19 DIAGNOSIS — E785 Hyperlipidemia, unspecified: Secondary | ICD-10-CM | POA: Diagnosis present

## 2016-03-19 DIAGNOSIS — I7300 Raynaud's syndrome without gangrene: Secondary | ICD-10-CM | POA: Diagnosis present

## 2016-03-19 DIAGNOSIS — Z681 Body mass index (BMI) 19 or less, adult: Secondary | ICD-10-CM

## 2016-03-19 DIAGNOSIS — R0902 Hypoxemia: Secondary | ICD-10-CM

## 2016-03-19 DIAGNOSIS — B961 Klebsiella pneumoniae [K. pneumoniae] as the cause of diseases classified elsewhere: Secondary | ICD-10-CM | POA: Diagnosis present

## 2016-03-19 DIAGNOSIS — I25701 Atherosclerosis of coronary artery bypass graft(s), unspecified, with angina pectoris with documented spasm: Secondary | ICD-10-CM | POA: Diagnosis not present

## 2016-03-19 DIAGNOSIS — F0390 Unspecified dementia without behavioral disturbance: Secondary | ICD-10-CM | POA: Diagnosis present

## 2016-03-19 DIAGNOSIS — Z79899 Other long term (current) drug therapy: Secondary | ICD-10-CM

## 2016-03-19 LAB — CBC WITH DIFFERENTIAL/PLATELET
Basophils Absolute: 0 10*3/uL (ref 0.0–0.1)
Basophils Relative: 0 %
Eosinophils Absolute: 0 10*3/uL (ref 0.0–0.7)
Eosinophils Relative: 0 %
HCT: 34.3 % — ABNORMAL LOW (ref 36.0–46.0)
HEMOGLOBIN: 10.9 g/dL — AB (ref 12.0–15.0)
LYMPHS ABS: 1.6 10*3/uL (ref 0.7–4.0)
Lymphocytes Relative: 16 %
MCH: 29.4 pg (ref 26.0–34.0)
MCHC: 31.8 g/dL (ref 30.0–36.0)
MCV: 92.5 fL (ref 78.0–100.0)
Monocytes Absolute: 0.9 10*3/uL (ref 0.1–1.0)
Monocytes Relative: 9 %
NEUTROS ABS: 7.8 10*3/uL — AB (ref 1.7–7.7)
NEUTROS PCT: 75 %
Platelets: 232 10*3/uL (ref 150–400)
RBC: 3.71 MIL/uL — AB (ref 3.87–5.11)
RDW: 14.6 % (ref 11.5–15.5)
WBC: 10.3 10*3/uL (ref 4.0–10.5)

## 2016-03-19 LAB — COMPREHENSIVE METABOLIC PANEL
ALK PHOS: 114 U/L (ref 38–126)
ALT: 15 U/L (ref 14–54)
AST: 19 U/L (ref 15–41)
Albumin: 2.9 g/dL — ABNORMAL LOW (ref 3.5–5.0)
Anion gap: 11 (ref 5–15)
BUN: 10 mg/dL (ref 6–20)
CO2: 29 mmol/L (ref 22–32)
Calcium: 8.6 mg/dL — ABNORMAL LOW (ref 8.9–10.3)
Chloride: 95 mmol/L — ABNORMAL LOW (ref 101–111)
Creatinine, Ser: 0.55 mg/dL (ref 0.44–1.00)
GFR calc non Af Amer: >60 mL/min (ref >60)
Glucose, Bld: 108 mg/dL — ABNORMAL HIGH (ref 65–99)
Potassium: 3.4 mmol/L — ABNORMAL LOW (ref 3.5–5.1)
SODIUM: 135 mmol/L (ref 135–145)
Total Bilirubin: 0.9 mg/dL (ref 0.3–1.2)
Total Protein: 6.7 g/dL (ref 6.5–8.1)

## 2016-03-19 LAB — D-DIMER, QUANTITATIVE: D-Dimer, Quant: 0.77 ug/mL-FEU — ABNORMAL HIGH (ref 0.00–0.50)

## 2016-03-19 LAB — I-STAT TROPONIN, ED: Troponin i, poc: 0.04 ng/mL (ref 0.00–0.08)

## 2016-03-19 LAB — BRAIN NATRIURETIC PEPTIDE: B Natriuretic Peptide: 365.7 pg/mL — ABNORMAL HIGH (ref 0.0–100.0)

## 2016-03-19 MED ORDER — TRAZODONE HCL 50 MG PO TABS
25.0000 mg | ORAL_TABLET | Freq: Every evening | ORAL | Status: DC | PRN
  Administered 2016-03-20 – 2016-03-24 (×3): 25 mg via ORAL
  Filled 2016-03-19 (×3): qty 1

## 2016-03-19 MED ORDER — ACETAMINOPHEN 325 MG PO TABS: 325.0000 mg | ORAL_TABLET | ORAL | Status: DC | PRN

## 2016-03-19 MED ORDER — ONDANSETRON HCL 4 MG PO TABS: 4.0000 mg | ORAL_TABLET | Freq: Four times a day (QID) | ORAL | Status: DC | PRN

## 2016-03-19 MED ORDER — ALBUTEROL SULFATE (2.5 MG/3ML) 0.083% IN NEBU
5.0000 mg | INHALATION_SOLUTION | Freq: Once | RESPIRATORY_TRACT | Status: AC
  Administered 2016-03-19: 5 mg via RESPIRATORY_TRACT
  Filled 2016-03-19: qty 6

## 2016-03-19 MED ORDER — ONDANSETRON HCL 4 MG/2ML IJ SOLN: 4.0000 mg | Freq: Four times a day (QID) | INTRAMUSCULAR | Status: DC | PRN

## 2016-03-19 MED ORDER — POTASSIUM CHLORIDE IN NACL 20-0.9 MEQ/L-% IV SOLN
INTRAVENOUS | Status: AC
  Administered 2016-03-20: via INTRAVENOUS
  Filled 2016-03-19: qty 1000

## 2016-03-19 MED ORDER — SODIUM CHLORIDE 0.9% FLUSH
3.0000 mL | Freq: Two times a day (BID) | INTRAVENOUS | Status: DC
  Administered 2016-03-20 – 2016-03-24 (×8): 3 mL via INTRAVENOUS

## 2016-03-19 MED ORDER — VANCOMYCIN HCL IN DEXTROSE 750-5 MG/150ML-% IV SOLN
750.0000 mg | INTRAVENOUS | Status: DC
  Administered 2016-03-20 (×2): 750 mg via INTRAVENOUS
  Filled 2016-03-19 (×2): qty 150

## 2016-03-19 MED ORDER — PIPERACILLIN-TAZOBACTAM 3.375 G IVPB 30 MIN
3.3750 g | Freq: Once | INTRAVENOUS | Status: AC
  Administered 2016-03-19: 3.375 g via INTRAVENOUS
  Filled 2016-03-19: qty 50

## 2016-03-19 MED ORDER — VANCOMYCIN HCL IN DEXTROSE 1-5 GM/200ML-% IV SOLN
1000.0000 mg | Freq: Once | INTRAVENOUS | Status: AC
  Administered 2016-03-19: 1000 mg via INTRAVENOUS
  Filled 2016-03-19: qty 200

## 2016-03-19 MED ORDER — ATORVASTATIN CALCIUM 20 MG PO TABS
20.0000 mg | ORAL_TABLET | Freq: Every day | ORAL | Status: DC
  Administered 2016-03-20 – 2016-03-23 (×4): 20 mg via ORAL
  Filled 2016-03-19 (×4): qty 1

## 2016-03-19 MED ORDER — AMLODIPINE BESYLATE 5 MG PO TABS
5.0000 mg | ORAL_TABLET | Freq: Every day | ORAL | Status: DC
  Administered 2016-03-20 – 2016-03-24 (×5): 5 mg via ORAL
  Filled 2016-03-19 (×5): qty 1

## 2016-03-19 MED ORDER — ENOXAPARIN SODIUM 30 MG/0.3ML ~~LOC~~ SOLN
30.0000 mg | Freq: Every day | SUBCUTANEOUS | Status: DC
  Administered 2016-03-20 – 2016-03-24 (×5): 30 mg via SUBCUTANEOUS
  Filled 2016-03-19 (×5): qty 0.3

## 2016-03-19 MED ORDER — PANTOPRAZOLE SODIUM 40 MG PO TBEC
40.0000 mg | DELAYED_RELEASE_TABLET | Freq: Every day | ORAL | Status: DC
  Administered 2016-03-20 – 2016-03-24 (×5): 40 mg via ORAL
  Filled 2016-03-19 (×5): qty 1

## 2016-03-19 MED ORDER — SODIUM CHLORIDE 0.9 % IV BOLUS (SEPSIS)
250.0000 mL | Freq: Once | INTRAVENOUS | Status: AC
  Administered 2016-03-19: 250 mL via INTRAVENOUS

## 2016-03-19 MED ORDER — AMIODARONE HCL 200 MG PO TABS
100.0000 mg | ORAL_TABLET | Freq: Every day | ORAL | Status: DC
  Administered 2016-03-20 – 2016-03-24 (×5): 100 mg via ORAL
  Filled 2016-03-19 (×5): qty 1

## 2016-03-19 MED ORDER — ENSURE ENLIVE PO LIQD
237.0000 mL | Freq: Two times a day (BID) | ORAL | Status: DC
  Administered 2016-03-20 (×2): 237 mL via ORAL

## 2016-03-19 MED ORDER — CITALOPRAM HYDROBROMIDE 20 MG PO TABS
20.0000 mg | ORAL_TABLET | Freq: Every day | ORAL | Status: DC
  Administered 2016-03-20 – 2016-03-24 (×5): 20 mg via ORAL
  Filled 2016-03-19 (×5): qty 1

## 2016-03-19 MED ORDER — HYDROCODONE-ACETAMINOPHEN 5-325 MG PO TABS
0.5000 | ORAL_TABLET | Freq: Two times a day (BID) | ORAL | Status: DC | PRN
  Administered 2016-03-23: 1 via ORAL
  Filled 2016-03-19: qty 1

## 2016-03-19 MED ORDER — IRBESARTAN 300 MG PO TABS
150.0000 mg | ORAL_TABLET | Freq: Every day | ORAL | Status: DC
  Administered 2016-03-20 – 2016-03-24 (×5): 150 mg via ORAL
  Filled 2016-03-19 (×5): qty 1

## 2016-03-19 MED ORDER — DEXTROSE 5 % IV SOLN
1.0000 g | Freq: Every day | INTRAVENOUS | Status: DC
  Administered 2016-03-20 – 2016-03-22 (×4): 1 g via INTRAVENOUS
  Filled 2016-03-19 (×6): qty 1

## 2016-03-19 NOTE — ED Triage Notes (Addendum)
Per [facility]: [facility] to ED from [facility] for evaluation requested by PCP - patient is at SNF recovering from L broken hip x 1 month. Per facility, pt has been recovering from a GI bug last week and is feeling more weak today. Pt was supposed to be discharged today, but SpO2 was 85% RA prior to leaving - 94% 2L O2, not normally on oxygen. C/o intermittent SOB "for a while." EMS VS: 154/62, P 78, CBG 114. Pt A&O per baseline, hx dementia. Denies fevers but also reports intermittent cough.

## 2016-03-19 NOTE — H&P (Signed)
History and Physical    [name] ZOX:[id number] DOB: 08/07/1920 DOA: 03/19/2016  PCP: Ezequiel KayserPERINI,MARK A, MD   Patient coming from: [facility].   Chief Complaint: Hypoxia.  HPI: [name] is a 83 y.o. female with medical history significant of aortic insufficiency, osteoarthritis, paroxysmal atrial fibrillation, coronary artery disease, carotid stenosis, dementia, history of compression fracture, hyperlipidemia, hypertension, IBS, PUD, Raynaud phenomenon, stroke, vitamin D deficiency who is coming to the emergency department from above-mentioned facility for evaluation of hypoxia of 85% on room air. She was admitted to the nursing home following a fall which was complicated by a pubic ramus fracture.  Per patient's daughter, she has been having diarrhea since last week until yesterday. Her appetite and sleep have been decreased. She has not been able to perform physical therapy as usual. She was getting ready to be discharged when she was noticed to have a temperature of 101F and was hypoxic as earlier mentioned. Patient complains of cough, occasionally productive of yellowish sputum, she denies chest pain, palpitations, dizziness, diaphoresis, abdominal pain, emesis and states that she has not had diarrhea today.  ED Course: The patient received supplemental oxygen, bronchodilators, cefepime and vancomycin per pharmacy. WBC 10.3, hemoglobin 10.9 g/dL, platelets [id number], chemistry showed a potassium level of 3.4 and a glucose of 108, but was otherwise within normal limits. itrop was normal, BNP 365.7. Her influenza A ECR was positive.  Review of Systems: As per HPI otherwise 10 point review of systems negative.    Past Medical History:  Diagnosis Date  . Aortic insufficiency Feb 2010   Mild to moderate per echo with normal EF  . Arthritis   . Atrial fibrillation with rapid ventricular response [facility](HCC) Feb 2010  . CAD (coronary artery disease)    2010.  60% RCA, 60 - 70% LAD  .  Carotid stenosis    40 - 59% bilateral  . Compression fracture   . Dementia   . Flash pulmonary edema [facility](HCC) Feb 2010   Due to atrial fib with RVR  . Hyperlipidemia   . Hypertension   . IBS (irritable bowel syndrome)   . PUD (peptic ulcer disease)   . Raynaud phenomenon   . Stroke (HCC)   . Vitamin D deficiency     Past Surgical History:  Procedure Laterality Date  . APPENDECTOMY    . CARDIAC CATHETERIZATION  03/11/2008   EF 45%  . TRANSTHORACIC ECHOCARDIOGRAM  03/06/2008   EF 55%     reports that she has never smoked. She has never used smokeless tobacco. She reports that she does not drink alcohol or use drugs.  Allergies  Allergen Reactions  . Aspirin Other (See Comments)  . Zoledronic Acid Other (See Comments)    REACTION: Intolerance    Family History  Problem Relation Age of Onset  . Stroke Mother   . Cancer Mother   . Heart attack Father     Prior to Admission medications   Medication Sig Start Date End Date Taking? Authorizing Provider  acetaminophen (TYLENOL) 325 MG tablet Take 325 mg by mouth every 4 (four) hours as needed for moderate pain.   Yes Historical Provider, MD  amiodarone (PACERONE) 200 MG tablet TAKE 1/2 TABLET BY MOUTH EVERY DAY 06/02/13  Yes Rosalio MacadamiaLori C Gerhardt, NP  amLODipine (NORVASC) 5 MG tablet Take 5 mg by mouth daily.  09/09/14  Yes Historical Provider, MD  atorvastatin (LIPITOR) 20 MG tablet Take 20 mg by mouth daily at 6 PM.  Yes Historical Provider, MD  citalopram (CELEXA) 20 MG tablet Take 20 mg by mouth daily.    Yes Historical Provider, MD  denosumab (PROLIA) 60 MG/ML SOLN injection Inject 60 mg into the skin every 6 (six) months. Administer in upper arm, thigh, or abdomen   Yes Historical Provider, MD  feeding supplement, ENSURE ENLIVE, (ENSURE ENLIVE) LIQD Take 237 mLs by mouth 2 (two) times daily between meals. 07/06/14  Yes Evlyn Kanner Love, PA-C  HYDROcodone-acetaminophen (NORCO/VICODIN) 5-325 MG tablet Take 0.5-1 tablets by mouth every 12  (twelve) hours as needed for severe pain. 02/27/16  Yes Marily Memos, MD  magnesium hydroxide (MILK OF MAGNESIA) 400 MG/5ML suspension Take 20 mLs by mouth daily as needed for mild constipation.   Yes Historical Provider, MD  OVER THE COUNTER MEDICATION Take 120 mLs by mouth 3 (three) times daily. Med pass   Yes Historical Provider, MD  pantoprazole (PROTONIX) 40 MG tablet Take 1 tablet (40 mg total) by mouth daily. 07/06/14  Yes Evlyn Kanner Love, PA-C  polyethylene glycol (MIRALAX / GLYCOLAX) packet Take 17 g by mouth daily.    Yes Historical Provider, MD  sennosides-docusate sodium (SENOKOT-S) 8.6-50 MG tablet Take 1 tablet by mouth daily.   Yes Historical Provider, MD  traZODone (DESYREL) 50 MG tablet Take 0.5-1 tablets (25-50 mg total) by mouth at bedtime as needed for sleep. Patient taking differently: Take 25 mg by mouth at bedtime.  07/06/14  Yes Evlyn Kanner Love, PA-C  valsartan (DIOVAN) 160 MG tablet takes 160mg  by mouth once daily 09/19/11  Yes Rollene Rotunda, MD  Vitamin D, Ergocalciferol, (DRISDOL) 50000 UNITS CAPS Take 50,000 Units by mouth every Wednesday.    Yes Historical Provider, MD    Physical Exam:  Constitutional: Looks chronically ill. Vitals:   03/19/16 2100 03/19/16 2115 03/19/16 2200 03/19/16 2319  BP:   146/68 (!) 155/74  Pulse: 77 75 85 (!) 101  Resp: 24 21 21 18   Temp:    (!) 100.7 F (38.2 C)  TempSrc:    Oral  SpO2: 94% 93% 96%    Eyes: PERRL, lids and conjunctivae normal ENMT: Mucous membranes are mildly dry. Posterior pharynx clear of any exudate or lesions.Dentures. Neck: normal, supple, no masses, no thyromegaly Respiratory: Decreased breath sounds on bases, no wheezing, no crackles. Normal respiratory effort. No accessory muscle use.  Cardiovascular: Irregularly irregular, positive murmur, no rubs / gallops. No extremity edema. 2+ pedal pulses. No carotid bruits.  Abdomen: no tenderness, no masses palpated. No hepatosplenomegaly. Bowel sounds positive.    Musculoskeletal: no clubbing / cyanosis. No joint deformity upper and lower extremities. Good ROM, no contractures. Normal muscle tone.  Skin: no rashes, lesions, ulcersOn limited skin exam. Neurologic: CN 2-12 grossly intact. Sensation intact, DTR normal. Strength 5/5 in all 4.  Psychiatric: Normal judgment and insight. Alert and oriented x 3. .    Labs on Admission: I have personally reviewed following labs and imaging studies  CBC:  Recent Labs Lab 03/19/16 2026  WBC 10.3  NEUTROABS 7.8*  HGB 10.9*  HCT 34.3*  MCV 92.5  PLT 232   Basic Metabolic Panel:  Recent Labs Lab 03/19/16 2026  NA 135  K 3.4*  CL 95*  CO2 29  GLUCOSE 108*  BUN 10  CREATININE 0.55  CALCIUM 8.6*   GFR: CrCl cannot be calculated (Unknown ideal weight.). Liver Function Tests:  Recent Labs Lab 03/19/16 2026  AST 19  ALT 15  ALKPHOS 114  BILITOT 0.9  PROT 6.7  ALBUMIN 2.9*   No results for input(s): LIPASE, AMYLASE in the last 168 hours. No results for input(s): AMMONIA in the last 168 hours. Coagulation Profile: No results for input(s): INR, PROTIME in the last 168 hours. Cardiac Enzymes: No results for input(s): CKTOTAL, CKMB, CKMBINDEX, TROPONINI in the last 168 hours. BNP (last 3 results) No results for input(s): PROBNP in the last 8760 hours. HbA1C: No results for input(s): HGBA1C in the last 72 hours. CBG: No results for input(s): GLUCAP in the last 168 hours. Lipid Profile: No results for input(s): CHOL, HDL, LDLCALC, TRIG, CHOLHDL, LDLDIRECT in the last 72 hours. Thyroid Function Tests: No results for input(s): TSH, T4TOTAL, FREET4, T3FREE, THYROIDAB in the last 72 hours. Anemia Panel: No results for input(s): VITAMINB12, FOLATE, FERRITIN, TIBC, IRON, RETICCTPCT in the last 72 hours. Urine analysis:    Component Value Date/Time   COLORURINE YELLOW 06/16/2014 2000   APPEARANCEUR CLEAR 06/16/2014 2000   LABSPEC 1.012 06/16/2014 2000   PHURINE 6.5 06/16/2014 2000    GLUCOSEU NEGATIVE 06/16/2014 2000   HGBUR NEGATIVE 06/16/2014 2000   BILIRUBINUR NEGATIVE 06/16/2014 2000   KETONESUR NEGATIVE 06/16/2014 2000   PROTEINUR NEGATIVE 06/16/2014 2000   UROBILINOGEN 0.2 06/16/2014 2000   NITRITE NEGATIVE 06/16/2014 2000   LEUKOCYTESUR NEGATIVE 06/16/2014 2000    Radiological Exams on Admission: Dg Chest 2 View  Result Date: 03/19/2016 CLINICAL DATA:  Subacute onset of shortness of breath and cough. Initial encounter. EXAM: CHEST  2 VIEW COMPARISON:  Chest radiograph performed 02/27/2016 FINDINGS: The lungs are well-aerated. Chronic calcified pleural plaques are seen bilaterally. Vascular congestion is noted. Increased interstitial markings may reflect mild interstitial edema. Alternatively, mildly increased right apical opacity may reflect pneumonia. There is no evidence of pleural effusion or pneumothorax. The heart is enlarged. No acute osseous abnormalities are seen. Diffuse calcification is seen along the thoracic and proximal abdominal aorta. IMPRESSION: 1. Vascular congestion and cardiomegaly. Increased interstitial markings may reflect mild interstitial edema. Alternatively, mildly increased right apical opacity may reflect pneumonia. 2. Chronic calcified bilateral pleural plaques noted. 3. Diffuse aortic atherosclerosis. Electronically Signed   By: Roanna Raider M.D.   On: 03/19/2016 20:04    EKG: Independently reviewed. Vent. rate 75 BPM PR interval * ms QRS duration 80 ms QT/QTc 428/479 ms P-R-T axes * 66 106 Atrial fibrillation Repol abnrm suggests ischemia, anterolateral  Assessment/Plan Principal Problem:   HCAP (healthcare-associated pneumonia)   SIRS (systemic inflammatory response syndrome) (HCC) Admit to telemetry/inpatient. Continue supplemental oxygen. Continue to schedule and as needed bronchodilators. Continue vancomycin and cefepime per pharmacy. Follow-up blood cultures and sensitivity. Check a sputum Gram stain culture and  sensitivity. Check strep pneumoniae and Legionella urinary antigen  Active Problems:   Influenza A Continue supplemental oxygen and bronchodilators. Tamiflu 75 mg by mouth twice a day    CAD (coronary artery disease) of artery bypass graft Continue atorvastatin. Not on beta blocker or antiplatelet therapy.    Paroxysmal atrial fibrillation (HCC) CHA2DS2-VASc Score of only 7. Not on anticoagulation at this time. Continue amiodarone 100 mg by mouth daily for rate control.    Essential hypertension Continue amlodipine 5 mg by mouth daily. Continue valsartan 160 mg by mouth daily. Monitor blood pressure    Hyperlipidemia Atorvastatin 20 mg by mouth daily.    Anemia due to chronic blood loss  Monitor hematocrit and hemoglobin.     UTI (urinary tract infection) Facility urinalysis done earlier today was showing significant pyuria. Continue cefepime and vancomycin. Follow-up facilities urine culture  and sensitivity.    DVT prophylaxis: Lovenox subcutaneous. Code Status: DO NOT RESUSCITATE/DO NOT INTUBATE. Family Communication: Her daughter Alona Bene was present in the ED room. Disposition Plan: Admit for IV antibiotic therapy for several days. Consults called:  Admission status: Inpatient/telemetry.   Bobette Mo MD [facility] Pager [phone].  If 7PM-7AM, please contact night-coverage [email or web address] Password [email or web address]  03/19/2016, 11:21 PM

## 2016-03-19 NOTE — ED Notes (Signed)
Patient transported to x-ray. ?

## 2016-03-19 NOTE — Progress Notes (Signed)
Called for report x 1.  No answer

## 2016-03-19 NOTE — Progress Notes (Signed)
Pharmacy Antibiotic Note  [name] is a 83 y.o. female admitted on 03/19/2016 with pneumonia.  Pharmacy has been consulted for Vancomycin and Cefepime dosing x 8 days.  Estimated CrC ~35 ml/min  Plan: Change Cefepime to 1gm IV q24h x 8 days Vancomycin 750mg  IV q24h x 8 days Will f/u micro data, renal function, and pt's clinical condition Vanc trough prn     Temp (24hrs), Avg:99.8 F (37.7 C), Min:99.8 F (37.7 C), Max:99.8 F (37.7 C)   Recent Labs Lab 03/19/16 2026  WBC 10.3  CREATININE 0.55    CrCl cannot be calculated (Unknown ideal weight.).    Allergies  Allergen Reactions  . Aspirin Other (See Comments)  . Zoledronic Acid Other (See Comments)    REACTION: Intolerance    Antimicrobials this admission: 2/21 Cefepime >> 2/28 2/21 Vanc >> 2/28  Dose adjustments this admission: n/a  Microbiology results: 2/20 BCx x2:   UCx:    Sputum:    Cdiff PCR:   Thank you for allowing pharmacy to be a part of this patient's care.  Christoper Fabianaron Itha Kroeker, PharmD, BCPS Clinical pharmacist, pager [phone] 03/19/2016 11:16 PM

## 2016-03-19 NOTE — ED Provider Notes (Signed)
[facility]-EMERGENCY DEPT Provider Note   CSN: [id number] Arrival date & time: 03/19/16  1910     History   Chief Complaint Chief Complaint  Patient presents with  . Shortness of Breath    low SpO2    HPI [name] is a 83 y.o. female.  HPI Patient with recent fall and pubic ramus fracture. She's been in nursing home since the fall. Normally she lives by herself. She has residual left-sided weakness from old stroke and uses a walker to ambulate. Over the last week patient has developed Intestinal illness. Initially started with multiple symptoms of vomiting and then has had diarrhea. This been persistent over the last few days. She's had decreased oral intake. She had increasing shortness of breath starting today. She's had cough but no definite sputum production. Noted to have fever to 101 this afternoon. Also noted by nursing home staff to be hypoxic with saturations in the mid 80s. Patient denies any chest pain or abdominal pain. Past Medical History:  Diagnosis Date  . Aortic insufficiency Feb 2010   Mild to moderate per echo with normal EF  . Arthritis   . Atrial fibrillation with rapid ventricular response [facility]) Feb 2010  . CAD (coronary artery disease)    2010.  60% RCA, 60 - 70% LAD  . Carotid stenosis    40 - 59% bilateral  . Compression fracture   . Dementia   . Flash pulmonary edema [facility]) Feb 2010   Due to atrial fib with RVR  . Hyperlipidemia   . Hypertension   . IBS (irritable bowel syndrome)   . PUD (peptic ulcer disease)   . Raynaud phenomenon   . Stroke (HCC)   . Vitamin D deficiency     Patient Active Problem List   Diagnosis Date Noted  . HCAP (healthcare-associated pneumonia) 03/19/2016  . SIRS (systemic inflammatory response syndrome) (HCC) 03/19/2016  . UTI (urinary tract infection) 03/19/2016  . Pain in joint, shoulder region 11/02/2014  . Anemia due to chronic blood loss 11/02/2014  . Nontraumatic subcortical hemorrhage of right cerebral  hemisphere (HCC) 11/02/2014  . Nontraumatic subcortical hemorrhage of cerebral hemisphere (HCC) 08/23/2014  . SAH (subarachnoid hemorrhage) (HCC) 08/23/2014  . Paroxysmal atrial fibrillation (HCC) 08/23/2014  . Essential hypertension 08/23/2014  . Hyperlipidemia 08/23/2014  . Mild TBI (HCC) 06/21/2014  . Traumatic brain injury (HCC)   . SDH (subdural hematoma) (HCC) 06/16/2014  . Nontraumatic hemorrhage of cerebral hemisphere (HCC)   . Hypertensive urgency   . CAD (coronary artery disease) of artery bypass graft 06/15/2012  . Carotid bruit present 06/15/2012  . Raynaud phenomenon 02/08/2011  . PAF (paroxysmal atrial fibrillation) (HCC) 10/09/2010  . High risk medication use 10/09/2010  . Aortic valve disease 10/09/2010  . HYPERTENSION 06/10/2007  . ALLERGIC RHINITIS 06/10/2007  . EMPHYSEMA 06/10/2007  . DYSPNEA 06/10/2007  . COUGH 06/10/2007    Past Surgical History:  Procedure Laterality Date  . APPENDECTOMY    . CARDIAC CATHETERIZATION  03/11/2008   EF 45%  . TRANSTHORACIC ECHOCARDIOGRAM  03/06/2008   EF 55%    OB History    No data available       Home Medications    Prior to Admission medications   Medication Sig Start Date End Date Taking? Authorizing Provider  acetaminophen (TYLENOL) 325 MG tablet Take 325 mg by mouth every 4 (four) hours as needed for moderate pain.   Yes Historical Provider, MD  amiodarone (PACERONE) 200 MG tablet TAKE 1/2 TABLET BY  MOUTH EVERY DAY 06/02/13  Yes Rosalio Macadamia, NP  amLODipine (NORVASC) 5 MG tablet Take 5 mg by mouth daily.  09/09/14  Yes Historical Provider, MD  atorvastatin (LIPITOR) 20 MG tablet Take 20 mg by mouth daily at 6 PM.    Yes Historical Provider, MD  citalopram (CELEXA) 20 MG tablet Take 20 mg by mouth daily.    Yes Historical Provider, MD  denosumab (PROLIA) 60 MG/ML SOLN injection Inject 60 mg into the skin every 6 (six) months. Administer in upper arm, thigh, or abdomen   Yes Historical Provider, MD  feeding  supplement, ENSURE ENLIVE, (ENSURE ENLIVE) LIQD Take 237 mLs by mouth 2 (two) times daily between meals. 07/06/14  Yes Evlyn Kanner Love, PA-C  HYDROcodone-acetaminophen (NORCO/VICODIN) 5-325 MG tablet Take 0.5-1 tablets by mouth every 12 (twelve) hours as needed for severe pain. 02/27/16  Yes Marily Memos, MD  magnesium hydroxide (MILK OF MAGNESIA) 400 MG/5ML suspension Take 20 mLs by mouth daily as needed for mild constipation.   Yes Historical Provider, MD  OVER THE COUNTER MEDICATION Take 120 mLs by mouth 3 (three) times daily. Med pass   Yes Historical Provider, MD  pantoprazole (PROTONIX) 40 MG tablet Take 1 tablet (40 mg total) by mouth daily. 07/06/14  Yes Evlyn Kanner Love, PA-C  polyethylene glycol (MIRALAX / GLYCOLAX) packet Take 17 g by mouth daily.    Yes Historical Provider, MD  sennosides-docusate sodium (SENOKOT-S) 8.6-50 MG tablet Take 1 tablet by mouth daily.   Yes Historical Provider, MD  traZODone (DESYREL) 50 MG tablet Take 0.5-1 tablets (25-50 mg total) by mouth at bedtime as needed for sleep. Patient taking differently: Take 25 mg by mouth at bedtime.  07/06/14  Yes Evlyn Kanner Love, PA-C  valsartan (DIOVAN) 160 MG tablet takes 160mg  by mouth once daily 09/19/11  Yes Rollene Rotunda, MD  Vitamin D, Ergocalciferol, (DRISDOL) 50000 UNITS CAPS Take 50,000 Units by mouth every Wednesday.    Yes Historical Provider, MD    Family History Family History  Problem Relation Age of Onset  . Stroke Mother   . Cancer Mother   . Heart attack Father     Social History Social History  Substance Use Topics  . Smoking status: Never Smoker  . Smokeless tobacco: Never Used  . Alcohol use No     Allergies   Aspirin and Zoledronic acid   Review of Systems Review of Systems  Constitutional: Positive for appetite change, fatigue and fever.  Respiratory: Positive for cough and shortness of breath.   Cardiovascular: Negative for chest pain, palpitations and leg swelling.  Gastrointestinal: Positive  for diarrhea, nausea and vomiting. Negative for abdominal pain.  Musculoskeletal: Negative for back pain, myalgias, neck pain and neck stiffness.  Skin: Negative for rash and wound.  Neurological: Positive for weakness (generalized). Negative for headaches.  All other systems reviewed and are negative.    Physical Exam Updated Vital Signs BP (!) 155/74 (BP Location: Left Arm)   Pulse (!) 101   Temp (!) 100.7 F (38.2 C) (Oral)   Resp 18   SpO2 96%   Physical Exam  Constitutional: She is oriented to person, place, and time. She appears well-developed. No distress.  Dry appearing  HENT:  Head: Normocephalic and atraumatic.  Mouth/Throat: Oropharynx is clear and moist. No oropharyngeal exudate.  Eyes: EOM are normal. Pupils are equal, round, and reactive to light.  Neck: Normal range of motion. Neck supple.  Cardiovascular: Normal rate, regular rhythm and intact distal  pulses.  Exam reveals no gallop and no friction rub.   Murmur heard. Pulmonary/Chest: Effort normal and breath sounds normal. No stridor.  Diminished breath sounds in bilateral bases.  Abdominal: Soft. Bowel sounds are normal. There is no tenderness. There is no rebound and no guarding.  Musculoskeletal: Normal range of motion. She exhibits no edema or tenderness.  No lower extremity swelling, asymmetry or tenderness.  Lymphadenopathy:    She has no cervical adenopathy.  Neurological: She is alert and oriented to person, place, and time.  5/5 motor in right upper and lower extremities. 4/5 motor in left upper and lower extremities. Sensation intact.  Skin: Skin is warm and dry. Capillary refill takes less than 2 seconds. No rash noted. No erythema.  Psychiatric: She has a normal mood and affect. Her behavior is normal.  Nursing note and vitals reviewed.    ED Treatments / Results  Labs (all labs ordered are listed, but only abnormal results are displayed) Labs Reviewed  BRAIN NATRIURETIC PEPTIDE - Abnormal;  Notable for the following:       Result Value   B Natriuretic Peptide 365.7 (*)    All other components within normal limits  CBC WITH DIFFERENTIAL/PLATELET - Abnormal; Notable for the following:    RBC 3.71 (*)    Hemoglobin 10.9 (*)    HCT 34.3 (*)    Neutro Abs 7.8 (*)    All other components within normal limits  COMPREHENSIVE METABOLIC PANEL - Abnormal; Notable for the following:    Potassium 3.4 (*)    Chloride 95 (*)    Glucose, Bld 108 (*)    Calcium 8.6 (*)    Albumin 2.9 (*)    All other components within normal limits  D-DIMER, QUANTITATIVE (NOT AT [facility]) - Abnormal; Notable for the following:    D-Dimer, Quant 0.77 (*)    All other components within normal limits  CULTURE, BLOOD (ROUTINE X 2)  CULTURE, BLOOD (ROUTINE X 2)  URINE CULTURE  GASTROINTESTINAL PANEL BY PCR, STOOL (REPLACES STOOL CULTURE)  C DIFFICILE QUICK SCREEN W PCR REFLEX  CULTURE, EXPECTORATED SPUTUM-ASSESSMENT  GRAM STAIN  INFLUENZA PANEL BY PCR (TYPE A & B)  URINALYSIS, ROUTINE W REFLEX MICROSCOPIC  STREP PNEUMONIAE URINARY ANTIGEN  CBC WITH DIFFERENTIAL/PLATELET  BASIC METABOLIC PANEL  I-STAT TROPOININ, ED    EKG  EKG Interpretation None       Radiology Dg Chest 2 View  Result Date: 03/19/2016 CLINICAL DATA:  Subacute onset of shortness of breath and cough. Initial encounter. EXAM: CHEST  2 VIEW COMPARISON:  Chest radiograph performed 02/27/2016 FINDINGS: The lungs are well-aerated. Chronic calcified pleural plaques are seen bilaterally. Vascular congestion is noted. Increased interstitial markings may reflect mild interstitial edema. Alternatively, mildly increased right apical opacity may reflect pneumonia. There is no evidence of pleural effusion or pneumothorax. The heart is enlarged. No acute osseous abnormalities are seen. Diffuse calcification is seen along the thoracic and proximal abdominal aorta. IMPRESSION: 1. Vascular congestion and cardiomegaly. Increased interstitial markings  may reflect mild interstitial edema. Alternatively, mildly increased right apical opacity may reflect pneumonia. 2. Chronic calcified bilateral pleural plaques noted. 3. Diffuse aortic atherosclerosis. Electronically Signed   By: Roanna Raider M.D.   On: 03/19/2016 20:04    Procedures Procedures (including critical care time)  Medications Ordered in ED Medications  acetaminophen (TYLENOL) tablet 325 mg (not administered)  HYDROcodone-acetaminophen (NORCO/VICODIN) 5-325 MG per tablet 0.5-1 tablet (not administered)  amLODipine (NORVASC) tablet 5 mg (not administered)  pantoprazole (  PROTONIX) EC tablet 40 mg (not administered)  feeding supplement (ENSURE ENLIVE) (ENSURE ENLIVE) liquid 237 mL (not administered)  citalopram (CELEXA) tablet 20 mg (not administered)  irbesartan (AVAPRO) tablet 150 mg (not administered)  atorvastatin (LIPITOR) tablet 20 mg (not administered)  amiodarone (PACERONE) tablet 100 mg (not administered)  traZODone (DESYREL) tablet 25 mg (not administered)  enoxaparin (LOVENOX) injection 30 mg (not administered)  sodium chloride flush (NS) 0.9 % injection 3 mL (not administered)  ceFEPIme (MAXIPIME) 1 g in dextrose 5 % 50 mL IVPB (not administered)  0.9 % NaCl with KCl 20 mEq/ L  infusion (not administered)  ondansetron (ZOFRAN) tablet 4 mg (not administered)    Or  ondansetron (ZOFRAN) injection 4 mg (not administered)  vancomycin (VANCOCIN) IVPB 750 mg/150 ml premix (not administered)  sodium chloride 0.9 % bolus 250 mL (0 mLs Intravenous Stopped 03/19/16 2117)  albuterol (PROVENTIL) (2.5 MG/3ML) 0.083% nebulizer solution 5 mg (5 mg Nebulization Given 03/19/16 2025)  piperacillin-tazobactam (ZOSYN) IVPB 3.375 g (0 g Intravenous Stopped 03/19/16 2117)  vancomycin (VANCOCIN) IVPB 1000 mg/200 mL premix (0 mg Intravenous Stopped 03/19/16 2223)     Initial Impression / Assessment and Plan / ED Course  I have reviewed the triage vital signs and the nursing  notes.  Pertinent labs & imaging results that were available during my care of the patient were reviewed by me and considered in my medical decision making (see chart for details).     Evidence of pneumonia on chest x-ray. Review of patient's UA results from today reveal UTI. Started on broad-spectrum antibiotics given recent hospitalization. Vital signs remained stable. Will admit to [facility] hospitalist.  Final Clinical Impressions(s) / ED Diagnoses   Final diagnoses:  HCAP (healthcare-associated pneumonia)  Acute lower UTI    New Prescriptions Current Discharge Medication List       Loren Raceravid Jeshurun [place], MD 03/19/16 [phone]

## 2016-03-20 ENCOUNTER — Inpatient Hospital Stay (HOSPITAL_COMMUNITY): Payer: Medicare Other

## 2016-03-20 DIAGNOSIS — I480 Paroxysmal atrial fibrillation: Secondary | ICD-10-CM

## 2016-03-20 DIAGNOSIS — I25701 Atherosclerosis of coronary artery bypass graft(s), unspecified, with angina pectoris with documented spasm: Secondary | ICD-10-CM

## 2016-03-20 DIAGNOSIS — J101 Influenza due to other identified influenza virus with other respiratory manifestations: Secondary | ICD-10-CM

## 2016-03-20 DIAGNOSIS — D500 Iron deficiency anemia secondary to blood loss (chronic): Secondary | ICD-10-CM

## 2016-03-20 LAB — URINALYSIS, ROUTINE W REFLEX MICROSCOPIC
Bilirubin Urine: NEGATIVE
GLUCOSE, UA: NEGATIVE mg/dL
Hgb urine dipstick: NEGATIVE
Ketones, ur: NEGATIVE mg/dL
Nitrite: NEGATIVE
PH: 7 (ref 5.0–8.0)
PROTEIN: 30 mg/dL — AB
Specific Gravity, Urine: 1.015 (ref 1.005–1.030)

## 2016-03-20 LAB — CBC WITH DIFFERENTIAL/PLATELET
Basophils Absolute: 0 10*3/uL (ref 0.0–0.1)
Basophils Relative: 0 %
Eosinophils Absolute: 0 10*3/uL (ref 0.0–0.7)
Eosinophils Relative: 0 %
HEMATOCRIT: 30.1 % — AB (ref 36.0–46.0)
HEMOGLOBIN: 9.6 g/dL — AB (ref 12.0–15.0)
LYMPHS ABS: 1.3 10*3/uL (ref 0.7–4.0)
LYMPHS PCT: 13 %
MCH: 29.5 pg (ref 26.0–34.0)
MCHC: 31.9 g/dL (ref 30.0–36.0)
MCV: 92.6 fL (ref 78.0–100.0)
Monocytes Absolute: 1.2 10*3/uL — ABNORMAL HIGH (ref 0.1–1.0)
Monocytes Relative: 11 %
NEUTROS ABS: 8 10*3/uL — AB (ref 1.7–7.7)
NEUTROS PCT: 76 %
Platelets: 228 10*3/uL (ref 150–400)
RBC: 3.25 MIL/uL — AB (ref 3.87–5.11)
RDW: 14.6 % (ref 11.5–15.5)
WBC: 10.5 10*3/uL (ref 4.0–10.5)

## 2016-03-20 LAB — BASIC METABOLIC PANEL
Anion gap: 11 (ref 5–15)
BUN: 8 mg/dL (ref 6–20)
CHLORIDE: 98 mmol/L — AB (ref 101–111)
CO2: 27 mmol/L (ref 22–32)
Calcium: 8.1 mg/dL — ABNORMAL LOW (ref 8.9–10.3)
Creatinine, Ser: 0.5 mg/dL (ref 0.44–1.00)
GFR calc Af Amer: >60 mL/min (ref >60)
GFR calc non Af Amer: >60 mL/min (ref >60)
GLUCOSE: 102 mg/dL — AB (ref 65–99)
POTASSIUM: 2.9 mmol/L — AB (ref 3.5–5.1)
Sodium: 136 mmol/L (ref 135–145)

## 2016-03-20 LAB — C DIFFICILE QUICK SCREEN W PCR REFLEX
C DIFFICILE (CDIFF) INTERP: NOT DETECTED
C Diff antigen: NEGATIVE
C Diff toxin: NEGATIVE

## 2016-03-20 LAB — INFLUENZA PANEL BY PCR (TYPE A & B)
INFLBPCR: NEGATIVE
Influenza A By PCR: POSITIVE — AB

## 2016-03-20 LAB — MRSA PCR SCREENING: MRSA by PCR: NEGATIVE

## 2016-03-20 MED ORDER — IOPAMIDOL (ISOVUE-370) INJECTION 76%
INTRAVENOUS | Status: AC
  Filled 2016-03-20: qty 100

## 2016-03-20 MED ORDER — IPRATROPIUM-ALBUTEROL 0.5-2.5 (3) MG/3ML IN SOLN
3.0000 mL | Freq: Four times a day (QID) | RESPIRATORY_TRACT | Status: DC
  Administered 2016-03-20 – 2016-03-21 (×8): 3 mL via RESPIRATORY_TRACT
  Filled 2016-03-20 (×9): qty 3

## 2016-03-20 MED ORDER — POTASSIUM CHLORIDE CRYS ER 20 MEQ PO TBCR: 40.0000 meq | EXTENDED_RELEASE_TABLET | ORAL | Status: DC

## 2016-03-20 MED ORDER — OSELTAMIVIR PHOSPHATE 75 MG PO CAPS
75.0000 mg | ORAL_CAPSULE | Freq: Two times a day (BID) | ORAL | Status: DC
  Administered 2016-03-20: 75 mg via ORAL
  Filled 2016-03-20: qty 1

## 2016-03-20 MED ORDER — BOOST / RESOURCE BREEZE PO LIQD
1.0000 | Freq: Three times a day (TID) | ORAL | Status: DC
Start: 2016-03-20 — End: 2016-03-24
  Administered 2016-03-21 – 2016-03-24 (×10): 1 via ORAL

## 2016-03-20 MED ORDER — OSELTAMIVIR PHOSPHATE 30 MG PO CAPS
30.0000 mg | ORAL_CAPSULE | Freq: Two times a day (BID) | ORAL | Status: DC
  Administered 2016-03-20 – 2016-03-24 (×8): 30 mg via ORAL
  Filled 2016-03-20 (×8): qty 1

## 2016-03-20 MED ORDER — POTASSIUM CHLORIDE 2 MEQ/ML IV SOLN
30.0000 meq | Freq: Once | INTRAVENOUS | Status: AC
  Administered 2016-03-20: 30 meq via INTRAVENOUS
  Filled 2016-03-20: qty 15

## 2016-03-20 NOTE — Progress Notes (Signed)
[name], [name] 83 y.o  female arrived on the unit via bed alert and oriented x 4 with her daughter along the bedside. IV in the right AC clean and intact with no redness.  Vital sign Bp: 155/74, temp: 100.7, Hr: 101 and resp 18.  Skin assessment completed.  No skin issues noted.  Educated the patient and daughter on how to reach the staff on the unit.  Explained the activation of the bed alarm due to safety precaution.  Patient and family expressed understanding.  Will continue to monitor patient

## 2016-03-20 NOTE — Progress Notes (Signed)
PT Cancellation Note  Patient Details Name: [name] MRN: [id number] DOB: 05/02/1920   Cancelled Treatment:    Reason Eval/Treat Not Completed: Fatigue/lethargy limiting ability to participate.  Pt fast asleep, pt's daughter present. Pt's daughter states pt has not been able to really eat for over a week and is just now getting to rest since coming in from the ED yesterday. Pt's daughter asks PT to wait to eval tomorrow.  PT educated pt's daughter on role of acute care PT, she expresses understanding.  PT to eval as appropriate.   Dayane Hillenburg 03/20/2016, 2:23 PM

## 2016-03-20 NOTE — Progress Notes (Signed)
Nutrition Follow-up  DOCUMENTATION CODES:   Severe malnutrition in context of chronic illness, Underweight  INTERVENTION:   -Boost Breeze po TID, each supplement provides 250 kcal and 9 grams of protein  NUTRITION DIAGNOSIS:   Malnutrition related to chronic illness as evidenced by moderate depletion of body fat, severe depletion of body fat, moderate depletions of muscle mass, severe depletion of muscle mass.  GOAL:   Patient will meet greater than or equal to 90% of their needs  MONITOR:   PO intake, Supplement acceptance, Labs, Weight trends, Skin, I & O's  REASON FOR ASSESSMENT:   Malnutrition Screening Tool    ASSESSMENT:   [name] is a 83 y.o. female with medical history significant of aortic insufficiency,  paroxysmal atrial fibrillation, coronary artery disease, dementia, hyperlipidemia, hypertension, IBS, PUD, stroke Presented with hypoxia of 85% on room air. Patient was admitted to a nursing home following a fall and pubic ramus fracture. Per patient's daughter she was having diarrhea since last week until the day before the admission. She also had fever of 101F with hypoxia, cough, occasionally productive with yellowish phlegm. Influenza PCR was positive.  Pt admitted with HCAP.   Pt very lethargic at time of visit. Hx obtained from pt daughter at bedside, who describes pt as a very active and healthy woman at baseline (lived alone and cooked for herself), however, has experienced a general decline in health over the past month after suffering a pelvic fracture. Since pelvic fracture, pt has been much less mobile and intake has been progressively declining. Pt daughter reports that appetite has decreased progressively over the past month, but intake has been very minimal over the past week, due to stomach bug and diarrhea. Pt consumed chicken broth from chicken noodle soup for lunch and was able to keep down.   Pt daughter reports wt loss, but reports pt has always  been a petite woman, citing UBW around 100# and pt has gotten down to 90#.   Nutrition-Focused physical exam completed. Findings are moderate to severe fat depletion, moderate to severe muscle depletion, and no edema.    Pt is a fairly selective eater related to IBS. Pt does not take Ensure well, but pt daughter reports she was taking Boost Breeze at SNF ([employer] flavor). Amenable to continue supplements while in the hospital.   Labs reviewed: K: 2.9 (on IV supplementation).  Diet Order:  Diet Heart Room service appropriate? Yes; Fluid consistency: Thin  Skin:  Reviewed, no issues  Last BM:  03/19/16  Height:   Ht Readings from Last 1 Encounters:  03/20/16 5\' 6"  (1.676 m)    Weight:   Wt Readings from Last 1 Encounters:  03/20/16 104 lb (47.2 kg)    Ideal Body Weight:  59.1 kg  BMI:  Body mass index is 16.79 kg/m.  Estimated Nutritional Needs:   Kcal:  1200-1400  Protein:  45-55 grams  Fluid:  1.2-1.4 L  EDUCATION NEEDS:   Education needs addressed  Verble Styron A. Mayford KnifeWilliams, RD, LDN, CDE Pager: [phone] After hours Pager: [phone]

## 2016-03-20 NOTE — Progress Notes (Addendum)
[facility]                                                                              Patient Demographics  [name], is a 83 y.o. female, DOB - Aug 07, 1920, VFI:[id number]  Admit date - 03/19/2016   Admitting Physician Bobette Mo, MD  Outpatient Primary MD for the patient is Ezequiel Kayser, MD  Outpatient specialists:   LOS - 1  days    Chief Complaint  Patient presents with  . Shortness of Breath    low SpO2       Brief summary  [name] is a 83 y.o. female with medical history significant of aortic insufficiency,  paroxysmal atrial fibrillation, coronary artery disease, dementia, hyperlipidemia, hypertension, IBS, PUD, stroke Presented with hypoxia of 85% on room air. Patient was admitted to a nursing home following a fall and pubic ramus fracture. Per patient's daughter she was having diarrhea since last week until the day before the admission. She also had fever of 101F with hypoxia, cough, occasionally productive with yellowish phlegm. Influenza PCR was positive.    Assessment & Plan    Principal Problem: HCAP (healthcare-associated pneumonia) With influenza, acute hypoxic respiratory failure - Chest x-ray showed vascular congestion, cardiomegaly, increased right apical opacity may reflect pneumonia - Influenza panel positive for influenza A - Follow blood cultures, sputum cultures, urine strep antigen, - D-dimer was slightly elevated at 0.77, ordered CT angiogram of the chest to rule out PE - BNP elevated at 365 Addendum CTA chest neg for PE   Active Problems:   Influenza A - Continue supplemental oxygen and bronchodilators. - Continue Tamiflu 75 mg by mouth twice a day    CAD (coronary artery disease) - Continue atorvastatin. - Not on beta blocker or antiplatelet therapy.    Paroxysmal atrial fibrillation (HCC) -CHA2DS2-VASc Score- 7. - Not on anticoagulation at this time, due to prior history of GI bleeding, had a history  of ICH and subdural hematoma, falls . - Continue amiodarone 100 mg daily for rate control.    Essential hypertension Continue amlodipine 5 mg daily, valsartan 160 mg daily.    Hyperlipidemia Continue Atorvastatin 20 mg by mouth daily.    Anemia due to chronic blood loss Monitor H&H     UTI (urinary tract infection) Continue cefepime, vancomycin, follow urine culture and sensitivity  Code Status: DNR  DVT Prophylaxis:  Lovenox Family Communication: Discussed in detail with the patient, all imaging results, lab results explained to the patient   Disposition Plan:   Time Spent in minutes  25 minutes  Procedures:    Consultants:     Antimicrobials:   IV vancomycin  IV cefepime  Tamiflu   Medications  Scheduled Meds: . amiodarone  100 mg Oral Daily  . amLODipine  5 mg Oral Daily  . atorvastatin  20 mg Oral q1800  . ceFEPime (MAXIPIME) IV  1 g Intravenous QHS  . citalopram  20 mg Oral Daily  . enoxaparin (LOVENOX) injection  30 mg Subcutaneous QHS  . feeding supplement (ENSURE ENLIVE)  237 mL Oral BID BM  . ipratropium-albuterol  3 mL Nebulization Q6H  .  irbesartan  150 mg Oral Daily  . oseltamivir  75 mg Oral BID  . pantoprazole  40 mg Oral Daily  . sodium chloride flush  3 mL Intravenous Q12H  . vancomycin  750 mg Intravenous Q24H   Continuous Infusions: . 0.9 % NaCl with KCl 20 mEq / L 50 mL/hr at 03/20/16 0015   PRN Meds:.acetaminophen, HYDROcodone-acetaminophen, ondansetron **OR** ondansetron (ZOFRAN) IV, traZODone   Antibiotics   Anti-infectives    Start     Dose/Rate Route Frequency Ordered Stop   03/20/16 0700  oseltamivir (TAMIFLU) capsule 75 mg     75 mg Oral 2 times daily 03/20/16 0605 03/25/16 0959   03/20/16 0000  vancomycin (VANCOCIN) IVPB 750 mg/150 ml premix     750 mg 150 mL/hr over 60 Minutes Intravenous Every 24 hours 03/19/16 2318 03/27/16 2359   03/19/16 2330  ceFEPIme (MAXIPIME) 1 g in dextrose 5 % 50 mL IVPB     1 g 100  mL/hr over 30 Minutes Intravenous Daily at bedtime 03/19/16 2315 03/27/16 2159   03/19/16 2030  piperacillin-tazobactam (ZOSYN) IVPB 3.375 g     3.375 g 100 mL/hr over 30 Minutes Intravenous  Once 03/19/16 2029 03/19/16 2117   03/19/16 2030  vancomycin (VANCOCIN) IVPB 1000 mg/200 mL premix     1,000 mg 200 mL/hr over 60 Minutes Intravenous  Once 03/19/16 2029 03/19/16 2223        Subjective:   [name] was seen and examined today. Feeling slightly better today., Overnight spiked fever 100.104F . Patient denies dizziness, chest pain, shortness of breath, abdominal pain, N/V/D/C, new weakness, numbess, tingling. No acute events overnight.    Objective:   Vitals:   03/19/16 2319 03/20/16 0155 03/20/16 0606 03/20/16 0743  BP: (!) 155/74  (!) 124/57   Pulse: (!) 101  68   Resp: 18  18   Temp: (!) 100.7 F (38.2 C)  98 F (36.7 C)   TempSrc: Oral  Oral   SpO2:  97%  95%    Intake/Output Summary (Last 24 hours) at 03/20/16 1146 Last data filed at 03/20/16 [id number]  Gross per 24 hour  Intake              500 ml  Output              400 ml  Net              100 ml     Wt Readings from Last 3 Encounters:  10/31/15 47.2 kg (104 lb 1.9 oz)  06/07/15 48.4 kg (106 lb 9.6 oz)  02/17/15 47 kg (103 lb 9.6 oz)     Exam  General: Alert and oriented x 3, NAD, frail and weak  HEENT:    Neck: Supple, no JVD  Cardiovascular: S1 S2 auscultated, no rubs, murmurs or gallops. Regular rate and rhythm.  Respiratory: Decreased breath sounds at the bases  Gastrointestinal: Soft, nontender, nondistended, + bowel sounds  Ext: no cyanosis clubbing or edema  Neuro: AAOx3, Cr N's II- XII. Strength 5/5 upper and lower extremities bilaterally  Skin: No rashes  Psych: Normal affect and demeanor, alert and oriented x3    Data Reviewed:  I have personally reviewed following labs and imaging studies  Micro Results Recent Results (from the past 240 hour(s))  Culture, blood (Routine X 2) w  Reflex to ID Panel     Status: None (Preliminary result)   Collection Time: 03/19/16  8:26 PM  Result Value Ref  Range Status   Specimen Description BLOOD RIGHT ANTECUBITAL  Final   Special Requests BOTTLES DRAWN AEROBIC AND ANAEROBIC 5CC  Final   Culture NO GROWTH < 12 HOURS  Final   Report Status PENDING  Incomplete  Culture, blood (Routine X 2) w Reflex to ID Panel     Status: None (Preliminary result)   Collection Time: 03/19/16  9:41 PM  Result Value Ref Range Status   Specimen Description BLOOD RIGHT HAND  Final   Special Requests IN PEDIATRIC BOTTLE  Final   Culture NO GROWTH < 12 HOURS  Final   Report Status PENDING  Incomplete  MRSA PCR Screening     Status: None   Collection Time: 03/19/16 11:53 PM  Result Value Ref Range Status   MRSA by PCR NEGATIVE NEGATIVE Final    Comment:        The GeneXpert MRSA Assay (FDA approved for NASAL specimens only), is one component of a comprehensive MRSA colonization surveillance program. It is not intended to diagnose MRSA infection nor to guide or monitor treatment for MRSA infections.     Radiology Reports Dg Chest 2 View  Result Date: 03/19/2016 CLINICAL DATA:  Subacute onset of shortness of breath and cough. Initial encounter. EXAM: CHEST  2 VIEW COMPARISON:  Chest radiograph performed 02/27/2016 FINDINGS: The lungs are well-aerated. Chronic calcified pleural plaques are seen bilaterally. Vascular congestion is noted. Increased interstitial markings may reflect mild interstitial edema. Alternatively, mildly increased right apical opacity may reflect pneumonia. There is no evidence of pleural effusion or pneumothorax. The heart is enlarged. No acute osseous abnormalities are seen. Diffuse calcification is seen along the thoracic and proximal abdominal aorta. IMPRESSION: 1. Vascular congestion and cardiomegaly. Increased interstitial markings may reflect mild interstitial edema. Alternatively, mildly increased right apical opacity  may reflect pneumonia. 2. Chronic calcified bilateral pleural plaques noted. 3. Diffuse aortic atherosclerosis. Electronically Signed   By: Roanna Raider M.D.   On: 03/19/2016 20:04   Dg Chest 2 View  Result Date: 02/27/2016 CLINICAL DATA:  Recent fall with left hip pain EXAM: CHEST  2 VIEW COMPARISON:  06/24/2014 FINDINGS: Lungs are well aerated bilaterally with chronic calcific changes in the apices bilaterally. The cardiac shadow is stable. Mild scoliosis of the thoracic spine is again seen and stable. No focal infiltrate, effusion or pneumothorax is seen. No acute bony abnormality is noted. IMPRESSION: Chronic pleural and parenchymal calcifications in the apices bilaterally. No acute abnormality is seen. Electronically Signed   By: Alcide Clever M.D.   On: 02/27/2016 09:14   Dg Hip Unilat W Or Wo Pelvis 2-3 Views Left  Result Date: 02/27/2016 CLINICAL DATA:  Recent fall with left hip pain, initial encounter EXAM: DG HIP (WITH OR WITHOUT PELVIS) 2-3V LEFT COMPARISON:  None. FINDINGS: Proximal left femur is within normal limits. Irregularity is noted along the superior pubic ramus on the left consistent with an undisplaced fracture. No other focal abnormality is noted. No soft tissue changes are seen. IMPRESSION: Left superior pubic ramus fracture Electronically Signed   By: Alcide Clever M.D.   On: 02/27/2016 09:13    Lab Data:  CBC:  Recent Labs Lab 03/19/16 2026 03/20/16 0724  WBC 10.3 10.5  NEUTROABS 7.8* 8.0*  HGB 10.9* 9.6*  HCT 34.3* 30.1*  MCV 92.5 92.6  PLT 232 228   Basic Metabolic Panel:  Recent Labs Lab 03/19/16 2026 03/20/16 0724  NA 135 136  K 3.4* 2.9*  CL 95* 98*  CO2 29  27  GLUCOSE 108* 102*  BUN 10 8  CREATININE 0.55 0.50  CALCIUM 8.6* 8.1*   GFR: CrCl cannot be calculated (Unknown ideal weight.). Liver Function Tests:  Recent Labs Lab 03/19/16 2026  AST 19  ALT 15  ALKPHOS 114  BILITOT 0.9  PROT 6.7  ALBUMIN 2.9*   No results for input(s):  LIPASE, AMYLASE in the last 168 hours. No results for input(s): AMMONIA in the last 168 hours. Coagulation Profile: No results for input(s): INR, PROTIME in the last 168 hours. Cardiac Enzymes: No results for input(s): CKTOTAL, CKMB, CKMBINDEX, TROPONINI in the last 168 hours. BNP (last 3 results) No results for input(s): PROBNP in the last 8760 hours. HbA1C: No results for input(s): HGBA1C in the last 72 hours. CBG: No results for input(s): GLUCAP in the last 168 hours. Lipid Profile: No results for input(s): CHOL, HDL, LDLCALC, TRIG, CHOLHDL, LDLDIRECT in the last 72 hours. Thyroid Function Tests: No results for input(s): TSH, T4TOTAL, FREET4, T3FREE, THYROIDAB in the last 72 hours. Anemia Panel: No results for input(s): VITAMINB12, FOLATE, FERRITIN, TIBC, IRON, RETICCTPCT in the last 72 hours. Urine analysis:    Component Value Date/Time   COLORURINE YELLOW 03/20/2016 0021   APPEARANCEUR HAZY (A) 03/20/2016 0021   LABSPEC 1.015 03/20/2016 0021   PHURINE 7.0 03/20/2016 0021   GLUCOSEU NEGATIVE 03/20/2016 0021   HGBUR NEGATIVE 03/20/2016 0021   BILIRUBINUR NEGATIVE 03/20/2016 0021   KETONESUR NEGATIVE 03/20/2016 0021   PROTEINUR 30 (A) 03/20/2016 0021   UROBILINOGEN 0.2 06/16/2014 2000   NITRITE NEGATIVE 03/20/2016 0021   LEUKOCYTESUR SMALL (A) 03/20/2016 0021     RAI,RIPUDEEP M.D. [facility] 03/20/2016, 11:46 AM  Pager: [phone] Between 7am to 7pm - call Pager - [phone]  After 7pm go to [email or web address] - password [email or web address]  Call night coverage person covering after 7pm

## 2016-03-21 DIAGNOSIS — I10 Essential (primary) hypertension: Secondary | ICD-10-CM

## 2016-03-21 DIAGNOSIS — E43 Unspecified severe protein-calorie malnutrition: Secondary | ICD-10-CM

## 2016-03-21 DIAGNOSIS — E785 Hyperlipidemia, unspecified: Secondary | ICD-10-CM

## 2016-03-21 LAB — MAGNESIUM: MAGNESIUM: 1.8 mg/dL (ref 1.7–2.4)

## 2016-03-21 LAB — CBC WITH DIFFERENTIAL/PLATELET
Basophils Absolute: 0 10*3/uL (ref 0.0–0.1)
Basophils Relative: 0 %
EOS ABS: 0 10*3/uL (ref 0.0–0.7)
Eosinophils Relative: 0 %
HEMATOCRIT: 31.8 % — AB (ref 36.0–46.0)
Hemoglobin: 10 g/dL — ABNORMAL LOW (ref 12.0–15.0)
LYMPHS ABS: 1.3 10*3/uL (ref 0.7–4.0)
LYMPHS PCT: 17 %
MCH: 29.6 pg (ref 26.0–34.0)
MCHC: 31.4 g/dL (ref 30.0–36.0)
MCV: 94.1 fL (ref 78.0–100.0)
MONOS PCT: 11 %
Monocytes Absolute: 0.8 10*3/uL (ref 0.1–1.0)
NEUTROS ABS: 5.3 10*3/uL (ref 1.7–7.7)
NEUTROS PCT: 72 %
Platelets: 240 10*3/uL (ref 150–400)
RBC: 3.38 MIL/uL — AB (ref 3.87–5.11)
RDW: 14.8 % (ref 11.5–15.5)
WBC: 7.4 10*3/uL (ref 4.0–10.5)

## 2016-03-21 LAB — GASTROINTESTINAL PANEL BY PCR, STOOL (REPLACES STOOL CULTURE)

## 2016-03-21 LAB — STREP PNEUMONIAE URINARY ANTIGEN: Strep Pneumo Urinary Antigen: NEGATIVE

## 2016-03-21 LAB — BASIC METABOLIC PANEL
Anion gap: 11 (ref 5–15)
BUN: 11 mg/dL (ref 6–20)
CO2: 24 mmol/L (ref 22–32)
CREATININE: 0.58 mg/dL (ref 0.44–1.00)
Calcium: 8.5 mg/dL — ABNORMAL LOW (ref 8.9–10.3)
Chloride: 103 mmol/L (ref 101–111)
GFR calc Af Amer: >60 mL/min (ref >60)
GFR calc non Af Amer: >60 mL/min (ref >60)
Glucose, Bld: 132 mg/dL — ABNORMAL HIGH (ref 65–99)
POTASSIUM: 3.9 mmol/L (ref 3.5–5.1)
SODIUM: 138 mmol/L (ref 135–145)

## 2016-03-21 NOTE — Progress Notes (Signed)
[facility]                                                                              Patient Demographics  [name], is a 83 y.o. female, DOB - 09/26/1920, ZOX:[id number]  Admit date - 03/19/2016   Admitting Physician Bobette Moavid Manuel Ortiz, MD  Outpatient Primary MD for the patient is Ezequiel KayserPERINI,MARK A, MD  Outpatient specialists:   LOS - 2  days    Chief Complaint  Patient presents with  . Shortness of Breath    low SpO2       Brief summary  [name] is a 83 y.o. female with medical history significant of aortic insufficiency,  paroxysmal atrial fibrillation, coronary artery disease, dementia, hyperlipidemia, hypertension, IBS, PUD, stroke Presented with hypoxia of 85% on room air. Patient was admitted to a nursing home following a fall and pubic ramus fracture. Per patient's daughter, she was having diarrhea since last week until the day before the admission. She also had fever of 101F with hypoxia, cough, occasionally productive with yellowish phlegm. Influenza PCR was positive.    Assessment & Plan    Principal Problem: HCAP (healthcare-associated pneumonia) With influenza, acute hypoxic respiratory failure - Chest x-ray showed vascular congestion, cardiomegaly, increased right apical opacity may reflect pneumonia - Influenza panel positive for influenza A - Blood cultures negative so far. Urine strep antigen still needs to be collected - D-dimer was slightly elevated at 0.77, CT angiogram negative for PE - BNP elevated at 365 - MRSA PCR screen negative, DC vancomycin, continue cefepime only  Active Problems:   Influenza A - Continue supplemental oxygen and bronchodilators. - Continue Tamiflu     CAD (coronary artery disease) - Continue atorvastatin. - Not on beta blocker or antiplatelet therapy.    Paroxysmal atrial fibrillation (HCC) -CHA2DS2-VASc Score- 7. - Not on anticoagulation at this time, due to prior history of GI bleeding, had a  history of ICH and subdural hematoma, falls . - Continue amiodarone 100 mg daily for rate control.    Essential hypertension Continue amlodipine 5 mg daily, valsartan 160 mg daily.    Hyperlipidemia Continue Atorvastatin 20 mg by mouth daily.    Anemia due to chronic blood loss Monitor H&H     UTI (urinary tract infection) - Urine culture showed gram-negative rods, de-escalate antibiotics, continue only cefepime for now  Severe malnutrition - appreciate dietitian recommendations  Recent pelvic fracture  - Patient was admitted to a nursing home following a fall and pubic ramus fracture. However she was being discharged from the nursing home and she fell ill with diarrhea and flu. Patient's daughter does not feel that patient will be able to manage at home by herself, she lived alone prior to the fall. PT evaluation pending.  Code Status: DNR  DVT Prophylaxis:  Lovenox Family Communication: Discussed in detail with the patient, all imaging results, lab results explained to the patient. Discussed with patient's daughter in detail on phone.    Disposition Plan:   Time Spent in minutes  25 minutes  Procedures:    Consultants:     Antimicrobials:   IV vancomycin  IV cefepime  Tamiflu   Medications  Scheduled Meds: . amiodarone  100 mg Oral Daily  . amLODipine  5 mg Oral Daily  . atorvastatin  20 mg Oral q1800  . ceFEPime (MAXIPIME) IV  1 g Intravenous QHS  . citalopram  20 mg Oral Daily  . enoxaparin (LOVENOX) injection  30 mg Subcutaneous QHS  . feeding supplement  1 Container Oral TID BM  . ipratropium-albuterol  3 mL Nebulization Q6H  . irbesartan  150 mg Oral Daily  . oseltamivir  30 mg Oral BID  . pantoprazole  40 mg Oral Daily  . sodium chloride flush  3 mL Intravenous Q12H  . vancomycin  750 mg Intravenous Q24H   Continuous Infusions:  PRN Meds:.acetaminophen, HYDROcodone-acetaminophen, ondansetron **OR** ondansetron (ZOFRAN) IV,  traZODone   Antibiotics   Anti-infectives    Start     Dose/Rate Route Frequency Ordered Stop   03/20/16 2200  oseltamivir (TAMIFLU) capsule 30 mg     30 mg Oral 2 times daily 03/20/16 1425 03/25/16 0959   03/20/16 0700  oseltamivir (TAMIFLU) capsule 75 mg  Status:  Discontinued     75 mg Oral 2 times daily 03/20/16 0605 03/20/16 1425   03/20/16 0000  vancomycin (VANCOCIN) IVPB 750 mg/150 ml premix     750 mg 150 mL/hr over 60 Minutes Intravenous Every 24 hours 03/19/16 2318 03/27/16 2359   03/19/16 2330  ceFEPIme (MAXIPIME) 1 g in dextrose 5 % 50 mL IVPB     1 g 100 mL/hr over 30 Minutes Intravenous Daily at bedtime 03/19/16 2315 03/27/16 2159   03/19/16 2030  piperacillin-tazobactam (ZOSYN) IVPB 3.375 g     3.375 g 100 mL/hr over 30 Minutes Intravenous  Once 03/19/16 2029 03/19/16 2117   03/19/16 2030  vancomycin (VANCOCIN) IVPB 1000 mg/200 mL premix     1,000 mg 200 mL/hr over 60 Minutes Intravenous  Once 03/19/16 2029 03/19/16 2223        Subjective:   [name] was seen and examined today. Feeling slightly better today.No fevers and chills this morning however still very weak. Per patient still not much appetite. Patient denies dizziness, chest pain, shortness of breath, abdominal pain, N/V/D/C, new weakness, numbess, tingling.   Objective:   Vitals:   03/20/16 2149 03/21/16 0257 03/21/16 0605 03/21/16 0734  BP: 111/61  (!) 142/57   Pulse: 79  82 92  Resp:   19 20  Temp: 98.4 F (36.9 C)  98.5 F (36.9 C)   TempSrc: Oral  Oral   SpO2: 95% 95% 94% 95%  Weight:      Height:        Intake/Output Summary (Last 24 hours) at 03/21/16 1053 Last data filed at 03/21/16 0647  Gross per 24 hour  Intake          1266.67 ml  Output                0 ml  Net          1266.67 ml     Wt Readings from Last 3 Encounters:  03/20/16 47.2 kg (104 lb)  10/31/15 47.2 kg (104 lb 1.9 oz)  06/07/15 48.4 kg (106 lb 9.6 oz)     Exam  General: Alert and oriented x 3, NAD,  frail and weak  HEENT:    Neck: Supple, no JVD  Cardiovascular: S1 S2 auscultated, no rubs, murmurs or gallops. Regular rate and rhythm.  Respiratory: Decreased breath sounds at the bases  Gastrointestinal: Soft, nontender, nondistended, + bowel sounds  Ext: no cyanosis clubbing or edema  Neuro: No new deficits  Skin: No rashes  Psych: Normal affect and demeanor, alert and oriented x3    Data Reviewed:  I have personally reviewed following labs and imaging studies  Micro Results Recent Results (from the past 240 hour(s))  C difficile quick scan w PCR reflex     Status: None   Collection Time: 03/19/16  7:44 PM  Result Value Ref Range Status   C Diff antigen NEGATIVE NEGATIVE Final   C Diff toxin NEGATIVE NEGATIVE Final   C Diff interpretation No C. difficile detected.  Final  Culture, blood (Routine X 2) w Reflex to ID Panel     Status: None (Preliminary result)   Collection Time: 03/19/16  8:26 PM  Result Value Ref Range Status   Specimen Description BLOOD RIGHT ANTECUBITAL  Final   Special Requests BOTTLES DRAWN AEROBIC AND ANAEROBIC 5CC  Final   Culture NO GROWTH < 12 HOURS  Final   Report Status PENDING  Incomplete  Culture, blood (Routine X 2) w Reflex to ID Panel     Status: None (Preliminary result)   Collection Time: 03/19/16  9:41 PM  Result Value Ref Range Status   Specimen Description BLOOD RIGHT HAND  Final   Special Requests IN PEDIATRIC BOTTLE  Final   Culture NO GROWTH < 12 HOURS  Final   Report Status PENDING  Incomplete  MRSA PCR Screening     Status: None   Collection Time: 03/19/16 11:53 PM  Result Value Ref Range Status   MRSA by PCR NEGATIVE NEGATIVE Final    Comment:        The GeneXpert MRSA Assay (FDA approved for NASAL specimens only), is one component of a comprehensive MRSA colonization surveillance program. It is not intended to diagnose MRSA infection nor to guide or monitor treatment for MRSA infections.   Urine culture      Status: Abnormal (Preliminary result)   Collection Time: 03/20/16 12:21 AM  Result Value Ref Range Status   Specimen Description URINE, CLEAN CATCH  Final   Special Requests NONE  Final   Culture 60,000 COLONIES/mL GRAM NEGATIVE RODS (A)  Final   Report Status PENDING  Incomplete    Radiology Reports Dg Chest 2 View  Result Date: 03/19/2016 CLINICAL DATA:  Subacute onset of shortness of breath and cough. Initial encounter. EXAM: CHEST  2 VIEW COMPARISON:  Chest radiograph performed 02/27/2016 FINDINGS: The lungs are well-aerated. Chronic calcified pleural plaques are seen bilaterally. Vascular congestion is noted. Increased interstitial markings may reflect mild interstitial edema. Alternatively, mildly increased right apical opacity may reflect pneumonia. There is no evidence of pleural effusion or pneumothorax. The heart is enlarged. No acute osseous abnormalities are seen. Diffuse calcification is seen along the thoracic and proximal abdominal aorta. IMPRESSION: 1. Vascular congestion and cardiomegaly. Increased interstitial markings may reflect mild interstitial edema. Alternatively, mildly increased right apical opacity may reflect pneumonia. 2. Chronic calcified bilateral pleural plaques noted. 3. Diffuse aortic atherosclerosis. Electronically Signed   By: Roanna Raider M.D.   On: 03/19/2016 20:04   Dg Chest 2 View  Result Date: 02/27/2016 CLINICAL DATA:  Recent fall with left hip pain EXAM: CHEST  2 VIEW COMPARISON:  06/24/2014 FINDINGS: Lungs are well aerated bilaterally with chronic calcific changes in the apices bilaterally. The cardiac shadow is stable. Mild scoliosis of the thoracic spine is again seen and stable. No focal infiltrate,  effusion or pneumothorax is seen. No acute bony abnormality is noted. IMPRESSION: Chronic pleural and parenchymal calcifications in the apices bilaterally. No acute abnormality is seen. Electronically Signed   By: Alcide Clever M.D.   On: 02/27/2016 09:14    Ct Angio Chest Pe W Or Wo Contrast  Result Date: 03/20/2016 CLINICAL DATA:  Hypoxia, question pulmonary embolism EXAM: CT ANGIOGRAPHY CHEST WITH CONTRAST TECHNIQUE: Multidetector CT imaging of the chest was performed using the standard protocol during bolus administration of intravenous contrast. Multiplanar CT image reconstructions and MIPs were obtained to evaluate the vascular anatomy. CONTRAST:  100 cc Isovue 370 IV COMPARISON:  None FINDINGS: Cardiovascular: Extensive atherosclerotic calcifications aorta, coronary arteries and proximal great vessels. Aorta normal caliber without aneurysm or dissection. Enlargement of cardiac chambers. No significant pericardial effusion. Mediastinum/Nodes: No thoracic adenopathy. Cervical base unremarkable. Esophagus normal appearance. Lungs/Pleura: Small BILATERAL pleural effusions. Dependent atelectasis in both lungs. Calcified granulomata bilaterally with calcified pleural plaques. Minimal patchy bibasilar infiltrate. No pneumothorax. Underlying emphysematous changes. Upper Abdomen: Unremarkable Musculoskeletal: No acute osseous findings. Diffuse osseous demineralization. Review of the MIP images confirms the above findings. IMPRESSION: No evidence of pulmonary embolism. Changes of COPD and old granulomatous disease with minimal patchy bibasilar infiltrates. Calcified pleural plaque disease bilaterally question asbestos exposure. Aortic atherosclerosis and coronary arterial calcifications. Electronically Signed   By: Ulyses Southward M.D.   On: 03/20/2016 13:24   Dg Hip Unilat W Or Wo Pelvis 2-3 Views Left  Result Date: 02/27/2016 CLINICAL DATA:  Recent fall with left hip pain, initial encounter EXAM: DG HIP (WITH OR WITHOUT PELVIS) 2-3V LEFT COMPARISON:  None. FINDINGS: Proximal left femur is within normal limits. Irregularity is noted along the superior pubic ramus on the left consistent with an undisplaced fracture. No other focal abnormality is noted. No soft tissue  changes are seen. IMPRESSION: Left superior pubic ramus fracture Electronically Signed   By: Alcide Clever M.D.   On: 02/27/2016 09:13    Lab Data:  CBC:  Recent Labs Lab 03/19/16 2026 03/20/16 0724 03/21/16 0445  WBC 10.3 10.5 7.4  NEUTROABS 7.8* 8.0* 5.3  HGB 10.9* 9.6* 10.0*  HCT 34.3* 30.1* 31.8*  MCV 92.5 92.6 94.1  PLT 232 228 240   Basic Metabolic Panel:  Recent Labs Lab 03/19/16 2026 03/20/16 0724 03/21/16 0445  NA 135 136 138  K 3.4* 2.9* 3.9  CL 95* 98* 103  CO2 29 27 24   GLUCOSE 108* 102* 132*  BUN 10 8 11   CREATININE 0.55 0.50 0.58  CALCIUM 8.6* 8.1* 8.5*  MG  --   --  1.8   GFR: Estimated Creatinine Clearance: 32.7 mL/min (by C-G formula based on SCr of 0.58 mg/dL). Liver Function Tests:  Recent Labs Lab 03/19/16 2026  AST 19  ALT 15  ALKPHOS 114  BILITOT 0.9  PROT 6.7  ALBUMIN 2.9*   No results for input(s): LIPASE, AMYLASE in the last 168 hours. No results for input(s): AMMONIA in the last 168 hours. Coagulation Profile: No results for input(s): INR, PROTIME in the last 168 hours. Cardiac Enzymes: No results for input(s): CKTOTAL, CKMB, CKMBINDEX, TROPONINI in the last 168 hours. BNP (last 3 results) No results for input(s): PROBNP in the last 8760 hours. HbA1C: No results for input(s): HGBA1C in the last 72 hours. CBG: No results for input(s): GLUCAP in the last 168 hours. Lipid Profile: No results for input(s): CHOL, HDL, LDLCALC, TRIG, CHOLHDL, LDLDIRECT in the last 72 hours. Thyroid Function Tests: No results for  input(s): TSH, T4TOTAL, FREET4, T3FREE, THYROIDAB in the last 72 hours. Anemia Panel: No results for input(s): VITAMINB12, FOLATE, FERRITIN, TIBC, IRON, RETICCTPCT in the last 72 hours. Urine analysis:    Component Value Date/Time   COLORURINE YELLOW 03/20/2016 0021   APPEARANCEUR HAZY (A) 03/20/2016 0021   LABSPEC 1.015 03/20/2016 0021   PHURINE 7.0 03/20/2016 0021   GLUCOSEU NEGATIVE 03/20/2016 0021   HGBUR  NEGATIVE 03/20/2016 0021   BILIRUBINUR NEGATIVE 03/20/2016 0021   KETONESUR NEGATIVE 03/20/2016 0021   PROTEINUR 30 (A) 03/20/2016 0021   UROBILINOGEN 0.2 06/16/2014 2000   NITRITE NEGATIVE 03/20/2016 0021   LEUKOCYTESUR SMALL (A) 03/20/2016 0021     RAI,RIPUDEEP M.D. [facility] 03/21/2016, 10:53 AM  Pager: [phone] Between 7am to 7pm - call Pager - [phone]  After 7pm go to [email or web address] - password [email or web address]  Call night coverage person covering after 7pm

## 2016-03-21 NOTE — Evaluation (Signed)
Physical Therapy Evaluation Patient Details Name: [name] MRN: [id number] DOB: 29-Apr-1920 Today's Date: 03/21/2016   History of Present Illness  [name] is a 83 y.o. female with medical history significant of aortic insufficiency, osteoarthritis, paroxysmal atrial fibrillation, coronary artery disease, carotid stenosis, dementia, history of compression fracture, hyperlipidemia, hypertension, IBS, PUD, Raynaud phenomenon, stroke, vitamin D deficiency who iwas admitted for evaluation of hypoxia of 85% on room air. She was admitted to the nursing home following a fall which was complicated by a pubic ramus fracture.  Clinical Impression  Patient presents with decreased independence with mobility due to deficits listed in PT problem list.  She will benefit from skilled PT in the acute setting to allow decreased burden of care at next venue.  Patient from [facility], but daughter prefers d/c to [facility] for continued rehab.  Daughter also notes that she is having some difficulty swallowing and may benefit from SLP swallow evaluation.    Follow Up Recommendations SNF    Equipment Recommendations  None recommended by PT    Recommendations for Other Services Speech consult     Precautions / Restrictions Precautions Precautions: Fall      Mobility  Bed Mobility Overal bed mobility: Needs Assistance Bed Mobility: Supine to Sit     Supine to sit: Min assist;HOB elevated     General bed mobility comments: for scooting out to EOB, used rail to lift herself up  Transfers Overall transfer level: Needs assistance Equipment used: Rolling walker (2 wheeled);None Transfers: Stand Pivot Transfers;Squat Pivot Transfers   Stand pivot transfers: Mod assist Squat pivot transfers: Max assist     General transfer comment: from bed to [facility] pivot without walker, max A for balance, cues for hand placement and moving feet; for BSC to recliner with RW mod A for balance with posterior  bias  Ambulation/Gait                Stairs            Wheelchair Mobility    Modified Rankin (Stroke Patients Only)       Balance Overall balance assessment: Needs assistance   Sitting balance-Leahy Scale: Fair   Postural control: Posterior lean Standing balance support: Bilateral upper extremity supported Standing balance-Leahy Scale: Poor                               Pertinent Vitals/Pain Pain Assessment: Faces Faces Pain Scale: Hurts a little bit Pain Location: generalized Pain Descriptors / Indicators: Aching Pain Intervention(s): Monitored during session    Home Living Family/patient expects to be discharged to:: Skilled nursing facility Living Arrangements: Alone                    Prior Function Level of Independence: Needs assistance   Gait / Transfers Assistance Needed: per pt and daughter reports she hasn't been up OOB in several days     Comments: pt was at SNF recovering from pelvic fracture     Hand Dominance   Dominant Hand: Right    Extremity/Trunk Assessment   Upper Extremity Assessment Upper Extremity Assessment: Generalized weakness    Lower Extremity Assessment Lower Extremity Assessment: Generalized weakness    Cervical / Trunk Assessment Cervical / Trunk Assessment: Kyphotic  Communication   Communication: HOH  Cognition Arousal/Alertness: Awake/alert Behavior During Therapy: WFL for tasks assessed/performed Overall Cognitive Status: No family/caregiver present to determine baseline cognitive functioning  General Comments: difficulty recalling why she is here, name of facility where she came from, kept deferring questions to her daughter who was available by phone    General Comments General comments (skin integrity, edema, etc.): cachexic, HR up to 92 with OOB to chair, cues for PLB on O2 via [place]    Exercises     Assessment/Plan    PT Assessment Patient needs  continued PT services  PT Problem List         PT Treatment Interventions DME instruction;Therapeutic exercise;Patient/family education;Therapeutic activities;Balance training;Functional mobility training    PT Goals (Current goals can be found in the Care Plan section)  Acute Rehab PT Goals Patient Stated Goal[facility] PT Goal Formulation: With patient/family Time For Goal Achievement: 03/28/16 Potential to Achieve Goals: Fair    Frequency Min 2X/week   Barriers to discharge        Co-evaluation               End of Session Equipment Utilized During Treatment: Gait belt;Oxygen Activity Tolerance: Patient limited by fatigue Patient left: in chair;with call bell/phone within reach;with chair alarm set   PT Visit Diagnosis: Unsteadiness on feet (R26.81);Other abnormalities of gait and mobility (R26.89)         Time: [id number] PT Time Calculation (min) (ACUTE ONLY): 27 min   Charges:   PT Evaluation $PT Eval Moderate Complexity: 1 Procedure PT Treatments $Therapeutic Activity: 8-22 mins   PT G Codes:         Elray McgregorCynthia Garland [place] 03/21/2016, 3:59 PM Sheran Lawlessyndi Lilee Aldea, PT [phone] 03/21/2016

## 2016-03-21 NOTE — Progress Notes (Signed)
PHARMACY NOTE:  ANTIMICROBIAL RENAL DOSAGE ADJUSTMENT  Current antimicrobial regimen includes a mismatch between antimicrobial dosage and estimated renal function.  As per policy approved by the Pharmacy & Therapeutics and Medical Executive Committees, the antimicrobial dosage will be adjusted accordingly.  Current antimicrobial dosage:  Tamiflu 75 mg BID  Indication: influenza A positive   Renal Function:  Estimated Creatinine Clearance: 32.7 mL/min (by C-G formula based on SCr of 0.58 mg/dL). []      On intermittent HD, scheduled: []      On CRRT    Antimicrobial dosage has been changed to:  Tamiflu 30 mg BID  Additional comments: Consider deescalating abx (noted GNR in urine), in light of positive influenza   Thank you for allowing pharmacy to be a part of this patient's care.  Pollyann SamplesAndy Saad Buhl, PharmD, BCPS 03/21/2016, 8:56 AM

## 2016-03-22 DIAGNOSIS — N390 Urinary tract infection, site not specified: Secondary | ICD-10-CM

## 2016-03-22 LAB — URINE CULTURE

## 2016-03-22 LAB — BASIC METABOLIC PANEL
ANION GAP: 7 (ref 5–15)
BUN: 8 mg/dL (ref 6–20)
CALCIUM: 8.7 mg/dL — AB (ref 8.9–10.3)
CO2: 27 mmol/L (ref 22–32)
CREATININE: 0.49 mg/dL (ref 0.44–1.00)
Chloride: 103 mmol/L (ref 101–111)
GLUCOSE: 87 mg/dL (ref 65–99)
Potassium: 3.7 mmol/L (ref 3.5–5.1)
Sodium: 137 mmol/L (ref 135–145)

## 2016-03-22 MED ORDER — IPRATROPIUM-ALBUTEROL 0.5-2.5 (3) MG/3ML IN SOLN: 3.0000 mL | Freq: Four times a day (QID) | RESPIRATORY_TRACT | Status: DC | PRN

## 2016-03-22 NOTE — [place] FL2 (Signed)
[place] MEDICAID FL2 LEVEL OF CARE SCREENING TOOL     IDENTIFICATION  Patient Name: [name] Birthdate: 02/23/1920 Sex: female Admission Date (Current Location): 03/19/2016  [facility] [place]   Facility and Address:  [facility] [place], [address], [place], [place] [address]      Provider Number: [id number]  Attending Physician Name and Address:  Cathren Harshipudeep K Rai, MD  Relative Name and Phone Number:       Current Level of Care: Hospital Recommended Level of Care: Skilled Nursing Facility Prior Approval Number:    Date Approved/Denied:   PASRR Number:    Discharge Plan: SNF    Current Diagnoses: Patient Active Problem List   Diagnosis Date Noted  . Protein-calorie malnutrition, severe 03/21/2016  . Influenza A 03/20/2016  . HCAP (healthcare-associated pneumonia) 03/19/2016  . SIRS (systemic inflammatory response syndrome) (HCC) 03/19/2016  . UTI (urinary tract infection) 03/19/2016  . Pain in joint, shoulder region 11/02/2014  . Anemia due to chronic blood loss 11/02/2014  . Nontraumatic subcortical hemorrhage of right cerebral hemisphere (HCC) 11/02/2014  . Nontraumatic subcortical hemorrhage of cerebral hemisphere (HCC) 08/23/2014  . SAH (subarachnoid hemorrhage) (HCC) 08/23/2014  . Paroxysmal atrial fibrillation (HCC) 08/23/2014  . Essential hypertension 08/23/2014  . Hyperlipidemia 08/23/2014  . Mild TBI (HCC) 06/21/2014  . Traumatic brain injury (HCC)   . SDH (subdural hematoma) (HCC) 06/16/2014  . Nontraumatic hemorrhage of cerebral hemisphere (HCC)   . Hypertensive urgency   . CAD (coronary artery disease) of artery bypass graft 06/15/2012  . Carotid bruit present 06/15/2012  . Raynaud phenomenon 02/08/2011  . PAF (paroxysmal atrial fibrillation) (HCC) 10/09/2010  . High risk medication use 10/09/2010  . Aortic valve disease 10/09/2010  . HYPERTENSION 06/10/2007  . ALLERGIC RHINITIS 06/10/2007  . EMPHYSEMA  06/10/2007  . DYSPNEA 06/10/2007  . COUGH 06/10/2007    Orientation RESPIRATION BLADDER Height & Weight     Self, Time, Situation, Place  O2 (2L [place]) Continent Weight: 104 lb (47.2 kg) (10/31/15) Height:  5\' 6"  (167.6 cm) (10/31/15)  BEHAVIORAL SYMPTOMS/MOOD NEUROLOGICAL BOWEL NUTRITION STATUS      Continent Diet (see DC summary)  AMBULATORY STATUS COMMUNICATION OF NEEDS Skin   Extensive Assist Verbally                         Personal Care Assistance Level of Assistance  Bathing, Dressing Bathing Assistance: Maximum assistance   Dressing Assistance: Maximum assistance     Functional Limitations Info             SPECIAL CARE FACTORS FREQUENCY  PT (By licensed PT), OT (By licensed OT)     PT Frequency: 5/wk OT Frequency: 5/wk            Contractures      Additional Factors Info  Code Status, Allergies, Isolation Precautions Code Status Info: DNR Allergies Info: Aspirin, Zoledronic Acid     Isolation Precautions Info: droplet     Current Medications (03/22/2016):  This is the current hospital active medication list Current Facility-Administered Medications  Medication Dose Route Frequency Provider Last Rate Last Dose  . acetaminophen (TYLENOL) tablet 325 mg  325 mg Oral Q4H PRN Bobette Moavid Manuel Ortiz, MD      . amiodarone (PACERONE) tablet 100 mg  100 mg Oral Daily Bobette Moavid Manuel Ortiz, MD   100 mg at 03/21/16 1013  . amLODipine (NORVASC) tablet 5 mg  5 mg Oral Daily  Bobette Mo, MD   5 mg at 03/21/16 1012  . atorvastatin (LIPITOR) tablet 20 mg  20 mg Oral q1800 Bobette Mo, MD   20 mg at 03/21/16 1743  . ceFEPIme (MAXIPIME) 1 g in dextrose 5 % 50 mL IVPB  1 g Intravenous QHS Bobette Mo, MD   1 g at 03/21/16 2217  . citalopram (CELEXA) tablet 20 mg  20 mg Oral Daily Bobette Mo, MD   20 mg at 03/21/16 1012  . enoxaparin (LOVENOX) injection 30 mg  30 mg Subcutaneous QHS Bobette Mo, MD   30 mg at 03/21/16 2217  . feeding  supplement (BOOST / RESOURCE BREEZE) liquid 1 Container  1 Container Oral TID BM Ripudeep TRUE Garciamartinez Luo, MD   1 Container at 03/21/16 2053  . HYDROcodone-acetaminophen (NORCO/VICODIN) 5-325 MG per tablet 0.5-1 tablet  0.5-1 tablet Oral Q12H PRN Bobette Mo, MD      . ipratropium-albuterol (DUONEB) 0.5-2.5 (3) MG/3ML nebulizer solution 3 mL  3 mL Nebulization Q6H PRN Ripudeep Amaia Lavallie Luo, MD      . irbesartan (AVAPRO) tablet 150 mg  150 mg Oral Daily Bobette Mo, MD   150 mg at 03/21/16 1011  . ondansetron (ZOFRAN) tablet 4 mg  4 mg Oral Q6H PRN Bobette Mo, MD       Or  . ondansetron [facility]) injection 4 mg  4 mg Intravenous Q6H PRN Bobette Mo, MD      . oseltamivir (TAMIFLU) capsule 30 mg  30 mg Oral BID Lynita Lombard [place], RPH   30 mg at 03/21/16 2217  . pantoprazole (PROTONIX) EC tablet 40 mg  40 mg Oral Daily Bobette Mo, MD   40 mg at 03/21/16 1012  . sodium chloride flush (NS) 0.9 % injection 3 mL  3 mL Intravenous Q12H Bobette Mo, MD   3 mL at 03/21/16 2217  . traZODone (DESYREL) tablet 25 mg  25 mg Oral QHS PRN Bobette Mo, MD   25 mg at 03/21/16 2224     Discharge Medications: Please see discharge summary for a list of discharge medications.  Relevant Imaging Results:  Relevant Lab Results:   Additional Information SS#: [id number]  Burna Sis, LCSW

## 2016-03-22 NOTE — Progress Notes (Signed)
[facility]                                                                              Patient Demographics  [name], is a 83 y.o. female, DOB - [birth year], UJW:[id number]  Admit date - 03/19/2016   Admitting Physician Bobette Mo, MD  Outpatient Primary MD for the patient is Ezequiel Kayser, MD  Outpatient specialists:   LOS - 3  days    Chief Complaint  Patient presents with  . Shortness of Breath    low SpO2       Brief summary  [name] is a 83 y.o. female with medical history significant of aortic insufficiency,  paroxysmal atrial fibrillation, coronary artery disease, dementia, hyperlipidemia, hypertension, IBS, PUD, stroke Presented with hypoxia of 85% on room air. Patient was admitted to a nursing home following a fall and pubic ramus fracture. Per patient's daughter, she was having diarrhea since last week until the day before the admission. She also had fever of 101F with hypoxia, cough, occasionally productive with yellowish phlegm. Influenza PCR was positive.    Assessment & Plan    Principal Problem: HCAP (healthcare-associated pneumonia) With influenza, acute hypoxic respiratory failure - Chest x-ray showed vascular congestion, cardiomegaly, increased right apical opacity may reflect pneumonia - Influenza panel positive for influenza A - Blood cultures negative so far. Urine strep antigen still needs to be collected - D-dimer was slightly elevated at 0.77, CT angiogram negative for PE - BNP elevated at 365 - MRSA PCR screen negative, DC vancomycin, continue cefepime only - slightly better today, still very weak and deconditioned. Daughter requesting palliative care to see patient for goals of care and follow at SNF.   Active Problems:   Influenza A - Continue supplemental oxygen and bronchodilators. - Continue Tamiflu     CAD (coronary artery disease) - Continue atorvastatin. - Not on beta blocker or antiplatelet  therapy.    Paroxysmal atrial fibrillation (HCC) -CHA2DS2-VASc Score- 7. - Not on anticoagulation at this time, due to prior history of GI bleeding, had a history of ICH and subdural hematoma, falls . - Continue amiodarone 100 mg daily for rate control.    Essential hypertension Continue amlodipine 5 mg daily, valsartan 160 mg daily.    Hyperlipidemia Continue Atorvastatin 20 mg by mouth daily.    Anemia due to chronic blood loss Monitor H&H    Klebsiella Pneumonia UTI (urinary tract infection) - Urine culture showed Klebsiella, continue only cefepime  Severe malnutrition - appreciate dietitian recommendations  Recent pelvic fracture  - Patient was admitted to a nursing home following a fall and pubic ramus fracture. However she was being discharged from the nursing home and she fell ill with diarrhea and flu. Patient's daughter does not feel that patient will be able to manage at home by herself, she lived alone prior to the fall. PT evaluation rec SNF .  Code Status: DNR  DVT Prophylaxis:  Lovenox Family Communication: Discussed in detail with the patient, all imaging results, lab results explained to the patient.   Disposition Plan: SNF in 24hrs   Time Spent in minutes  25  minutes  Procedures:    Consultants:     Antimicrobials:   IV vancomycin  IV cefepime  Tamiflu   Medications  Scheduled Meds: . amiodarone  100 mg Oral Daily  . amLODipine  5 mg Oral Daily  . atorvastatin  20 mg Oral q1800  . ceFEPime (MAXIPIME) IV  1 g Intravenous QHS  . citalopram  20 mg Oral Daily  . enoxaparin (LOVENOX) injection  30 mg Subcutaneous QHS  . feeding supplement  1 Container Oral TID BM  . irbesartan  150 mg Oral Daily  . oseltamivir  30 mg Oral BID  . pantoprazole  40 mg Oral Daily  . sodium chloride flush  3 mL Intravenous Q12H   Continuous Infusions:  PRN Meds:.acetaminophen, HYDROcodone-acetaminophen, ipratropium-albuterol, ondansetron **OR** ondansetron  (ZOFRAN) IV, traZODone   Antibiotics   Anti-infectives    Start     Dose/Rate Route Frequency Ordered Stop   03/20/16 2200  oseltamivir (TAMIFLU) capsule 30 mg     30 mg Oral 2 times daily 03/20/16 1425 03/25/16 0959   03/20/16 0700  oseltamivir (TAMIFLU) capsule 75 mg  Status:  Discontinued     75 mg Oral 2 times daily 03/20/16 0605 03/20/16 1425   03/20/16 0000  vancomycin (VANCOCIN) IVPB 750 mg/150 ml premix  Status:  Discontinued     750 mg 150 mL/hr over 60 Minutes Intravenous Every 24 hours 03/19/16 2318 03/21/16 1054   03/19/16 2330  ceFEPIme (MAXIPIME) 1 g in dextrose 5 % 50 mL IVPB     1 g 100 mL/hr over 30 Minutes Intravenous Daily at bedtime 03/19/16 2315 03/27/16 2159   03/19/16 2030  piperacillin-tazobactam (ZOSYN) IVPB 3.375 g     3.375 g 100 mL/hr over 30 Minutes Intravenous  Once 03/19/16 2029 03/19/16 2117   03/19/16 2030  vancomycin (VANCOCIN) IVPB 1000 mg/200 mL premix     1,000 mg 200 mL/hr over 60 Minutes Intravenous  Once 03/19/16 2029 03/19/16 2223        Subjective:   [name] was seen and examined today. Very weak and deconditioned. Appetite not much betters, no fevers. Patient denies dizziness, chest pain, shortness of breath, abdominal pain, N/V/D/C, new weakness, numbess, tingling.   Objective:   Vitals:   03/21/16 1547 03/21/16 2048 03/21/16 2212 03/22/16 0605  BP: 122/72  (!) 138/59 (!) 147/48  Pulse: 78  82 68  Resp: (!) 21  17 18   Temp: 98.9 F (37.2 C)  98.5 F (36.9 C) 98.2 F (36.8 C)  TempSrc: Oral  Oral Oral  SpO2: 96% 94% 98% 100%  Weight:      Height:        Intake/Output Summary (Last 24 hours) at 03/22/16 1431 Last data filed at 03/22/16 1421  Gross per 24 hour  Intake              650 ml  Output              900 ml  Net             -250 ml     Wt Readings from Last 3 Encounters:  03/20/16 47.2 kg (104 lb)  10/31/15 47.2 kg (104 lb 1.9 oz)  06/07/15 48.4 kg (106 lb 9.6 oz)     Exam  General: Alert and  oriented x 3, NAD, frail and weak  HEENT:    Neck: Supple, no JVD  Cardiovascular: S1 S2 clear. RRR  Respiratory: Decreased breath sounds at the bases  Gastrointestinal: Soft, nontender, nondistended, + bowel sounds  Ext: no cyanosis clubbing or edema  Neuro: No new deficits  Skin: No rashes  Psych: Normal affect and demeanor, alert and oriented x3    Data Reviewed:  I have personally reviewed following labs and imaging studies  Micro Results Recent Results (from the past 240 hour(s))  Gastrointestinal Panel by PCR , Stool     Status: None   Collection Time: 03/19/16  7:44 PM  Result Value Ref Range Status   Campylobacter species NOT DETECTED NOT DETECTED Final   Plesimonas shigelloides NOT DETECTED NOT DETECTED Final   Salmonella species NOT DETECTED NOT DETECTED Final   Yersinia enterocolitica NOT DETECTED NOT DETECTED Final   Vibrio species NOT DETECTED NOT DETECTED Final   Vibrio cholerae NOT DETECTED NOT DETECTED Final   Enteroaggregative E coli (EAEC) NOT DETECTED NOT DETECTED Final   Enteropathogenic E coli (EPEC) NOT DETECTED NOT DETECTED Final   Enterotoxigenic E coli (ETEC) NOT DETECTED NOT DETECTED Final   Shiga like toxin producing E coli (STEC) NOT DETECTED NOT DETECTED Final   Shigella/Enteroinvasive E coli (EIEC) NOT DETECTED NOT DETECTED Final   Cryptosporidium NOT DETECTED NOT DETECTED Final   Cyclospora cayetanensis NOT DETECTED NOT DETECTED Final   Entamoeba histolytica NOT DETECTED NOT DETECTED Final   Giardia lamblia NOT DETECTED NOT DETECTED Final   Adenovirus F40/41 NOT DETECTED NOT DETECTED Final   Astrovirus NOT DETECTED NOT DETECTED Final   Norovirus GI/GII NOT DETECTED NOT DETECTED Final   Rotavirus A NOT DETECTED NOT DETECTED Final   Sapovirus (I, II, IV, and V) NOT DETECTED NOT DETECTED Final  C difficile quick scan w PCR reflex     Status: None   Collection Time: 03/19/16  7:44 PM  Result Value Ref Range Status   C Diff antigen NEGATIVE  NEGATIVE Final   C Diff toxin NEGATIVE NEGATIVE Final   C Diff interpretation No C. difficile detected.  Final  Culture, blood (Routine X 2) w Reflex to ID Panel     Status: None (Preliminary result)   Collection Time: 03/19/16  8:26 PM  Result Value Ref Range Status   Specimen Description BLOOD RIGHT ANTECUBITAL  Final   Special Requests BOTTLES DRAWN AEROBIC AND ANAEROBIC 5CC  Final   Culture NO GROWTH 3 DAYS  Final   Report Status PENDING  Incomplete  Culture, blood (Routine X 2) w Reflex to ID Panel     Status: None (Preliminary result)   Collection Time: 03/19/16  9:41 PM  Result Value Ref Range Status   Specimen Description BLOOD RIGHT HAND  Final   Special Requests IN PEDIATRIC BOTTLE  Final   Culture NO GROWTH 3 DAYS  Final   Report Status PENDING  Incomplete  MRSA PCR Screening     Status: None   Collection Time: 03/19/16 11:53 PM  Result Value Ref Range Status   MRSA by PCR NEGATIVE NEGATIVE Final    Comment:        The GeneXpert MRSA Assay (FDA approved for NASAL specimens only), is one component of a comprehensive MRSA colonization surveillance program. It is not intended to diagnose MRSA infection nor to guide or monitor treatment for MRSA infections.   Urine culture     Status: Abnormal   Collection Time: 03/20/16 12:21 AM  Result Value Ref Range Status   Specimen Description URINE, CLEAN CATCH  Final   Special Requests NONE  Final   Culture 60,000 COLONIES/mL KLEBSIELLA PNEUMONIAE (A)  Final   Report Status 03/22/2016 FINAL  Final   Organism ID, Bacteria KLEBSIELLA PNEUMONIAE (A)  Final      Susceptibility   Klebsiella pneumoniae - MIC*    AMPICILLIN RESISTANT Resistant     CEFAZOLIN <=4 SENSITIVE Sensitive     CEFTRIAXONE <=1 SENSITIVE Sensitive     CIPROFLOXACIN <=0.25 SENSITIVE Sensitive     GENTAMICIN <=1 SENSITIVE Sensitive     IMIPENEM <=0.25 SENSITIVE Sensitive     NITROFURANTOIN 64 INTERMEDIATE Intermediate     TRIMETH/SULFA <=20 SENSITIVE  Sensitive     AMPICILLIN/SULBACTAM 4 SENSITIVE Sensitive     PIP/TAZO <=4 SENSITIVE Sensitive     Extended ESBL NEGATIVE Sensitive     * 60,000 COLONIES/mL KLEBSIELLA PNEUMONIAE    Radiology Reports Dg Chest 2 View  Result Date: 03/19/2016 CLINICAL DATA:  Subacute onset of shortness of breath and cough. Initial encounter. EXAM: CHEST  2 VIEW COMPARISON:  Chest radiograph performed 02/27/2016 FINDINGS: The lungs are well-aerated. Chronic calcified pleural plaques are seen bilaterally. Vascular congestion is noted. Increased interstitial markings may reflect mild interstitial edema. Alternatively, mildly increased right apical opacity may reflect pneumonia. There is no evidence of pleural effusion or pneumothorax. The heart is enlarged. No acute osseous abnormalities are seen. Diffuse calcification is seen along the thoracic and proximal abdominal aorta. IMPRESSION: 1. Vascular congestion and cardiomegaly. Increased interstitial markings may reflect mild interstitial edema. Alternatively, mildly increased right apical opacity may reflect pneumonia. 2. Chronic calcified bilateral pleural plaques noted. 3. Diffuse aortic atherosclerosis. Electronically Signed   By: Roanna RaiderJeffery  Chang M.D.   On: 03/19/2016 20:04   Dg Chest 2 View  Result Date: 02/27/2016 CLINICAL DATA:  Recent fall with left hip pain EXAM: CHEST  2 VIEW COMPARISON:  06/24/2014 FINDINGS: Lungs are well aerated bilaterally with chronic calcific changes in the apices bilaterally. The cardiac shadow is stable. Mild scoliosis of the thoracic spine is again seen and stable. No focal infiltrate, effusion or pneumothorax is seen. No acute bony abnormality is noted. IMPRESSION: Chronic pleural and parenchymal calcifications in the apices bilaterally. No acute abnormality is seen. Electronically Signed   By: Alcide CleverMark  Lukens M.D.   On: 02/27/2016 09:14   Ct Angio Chest Pe W Or Wo Contrast  Result Date: 03/20/2016 CLINICAL DATA:  Hypoxia, question  pulmonary embolism EXAM: CT ANGIOGRAPHY CHEST WITH CONTRAST TECHNIQUE: Multidetector CT imaging of the chest was performed using the standard protocol during bolus administration of intravenous contrast. Multiplanar CT image reconstructions and MIPs were obtained to evaluate the vascular anatomy. CONTRAST:  100 cc Isovue 370 IV COMPARISON:  None FINDINGS: Cardiovascular: Extensive atherosclerotic calcifications aorta, coronary arteries and proximal great vessels. Aorta normal caliber without aneurysm or dissection. Enlargement of cardiac chambers. No significant pericardial effusion. Mediastinum/Nodes: No thoracic adenopathy. Cervical base unremarkable. Esophagus normal appearance. Lungs/Pleura: Small BILATERAL pleural effusions. Dependent atelectasis in both lungs. Calcified granulomata bilaterally with calcified pleural plaques. Minimal patchy bibasilar infiltrate. No pneumothorax. Underlying emphysematous changes. Upper Abdomen: Unremarkable Musculoskeletal: No acute osseous findings. Diffuse osseous demineralization. Review of the MIP images confirms the above findings. IMPRESSION: No evidence of pulmonary embolism. Changes of COPD and old granulomatous disease with minimal patchy bibasilar infiltrates. Calcified pleural plaque disease bilaterally question asbestos exposure. Aortic atherosclerosis and coronary arterial calcifications. Electronically Signed   By: Ulyses SouthwardMark  Boles M.D.   On: 03/20/2016 13:24   Dg Hip Unilat W Or Wo Pelvis 2-3 Views Left  Result Date: 02/27/2016 CLINICAL DATA:  Recent fall with left hip pain, initial  encounter EXAM: DG HIP (WITH OR WITHOUT PELVIS) 2-3V LEFT COMPARISON:  None. FINDINGS: Proximal left femur is within normal limits. Irregularity is noted along the superior pubic ramus on the left consistent with an undisplaced fracture. No other focal abnormality is noted. No soft tissue changes are seen. IMPRESSION: Left superior pubic ramus fracture Electronically Signed   By: Alcide Clever M.D.   On: 02/27/2016 09:13    Lab Data:  CBC:  Recent Labs Lab 03/19/16 2026 03/20/16 0724 03/21/16 0445  WBC 10.3 10.5 7.4  NEUTROABS 7.8* 8.0* 5.3  HGB 10.9* 9.6* 10.0*  HCT 34.3* 30.1* 31.8*  MCV 92.5 92.6 94.1  PLT 232 228 240   Basic Metabolic Panel:  Recent Labs Lab 03/19/16 2026 03/20/16 0724 03/21/16 0445 03/22/16 0534  NA 135 136 138 137  K 3.4* 2.9* 3.9 3.7  CL 95* 98* 103 103  CO2 29 27 24 27   GLUCOSE 108* 102* 132* 87  BUN 10 8 11 8   CREATININE 0.55 0.50 0.58 0.49  CALCIUM 8.6* 8.1* 8.5* 8.7*  MG  --   --  1.8  --    GFR: Estimated Creatinine Clearance: 32.7 mL/min (by C-G formula based on SCr of 0.49 mg/dL). Liver Function Tests:  Recent Labs Lab 03/19/16 2026  AST 19  ALT 15  ALKPHOS 114  BILITOT 0.9  PROT 6.7  ALBUMIN 2.9*   No results for input(s): LIPASE, AMYLASE in the last 168 hours. No results for input(s): AMMONIA in the last 168 hours. Coagulation Profile: No results for input(s): INR, PROTIME in the last 168 hours. Cardiac Enzymes: No results for input(s): CKTOTAL, CKMB, CKMBINDEX, TROPONINI in the last 168 hours. BNP (last 3 results) No results for input(s): PROBNP in the last 8760 hours. HbA1C: No results for input(s): HGBA1C in the last 72 hours. CBG: No results for input(s): GLUCAP in the last 168 hours. Lipid Profile: No results for input(s): CHOL, HDL, LDLCALC, TRIG, CHOLHDL, LDLDIRECT in the last 72 hours. Thyroid Function Tests: No results for input(s): TSH, T4TOTAL, FREET4, T3FREE, THYROIDAB in the last 72 hours. Anemia Panel: No results for input(s): VITAMINB12, FOLATE, FERRITIN, TIBC, IRON, RETICCTPCT in the last 72 hours. Urine analysis:    Component Value Date/Time   COLORURINE YELLOW 03/20/2016 0021   APPEARANCEUR HAZY (A) 03/20/2016 0021   LABSPEC 1.015 03/20/2016 0021   PHURINE 7.0 03/20/2016 0021   GLUCOSEU NEGATIVE 03/20/2016 0021   HGBUR NEGATIVE 03/20/2016 0021   BILIRUBINUR NEGATIVE  03/20/2016 0021   KETONESUR NEGATIVE 03/20/2016 0021   PROTEINUR 30 (A) 03/20/2016 0021   UROBILINOGEN 0.2 06/16/2014 2000   NITRITE NEGATIVE 03/20/2016 0021   LEUKOCYTESUR SMALL (A) 03/20/2016 0021     Keyari Kleeman M.D. [facility] 03/22/2016, 2:31 PM  Pager: [phone] Between 7am to 7pm - call Pager - [phone]  After 7pm go to [email or web address] - password [email or web address]  Call night coverage person covering after 7pm

## 2016-03-22 NOTE — Evaluation (Signed)
Clinical/Bedside Swallow Evaluation Patient Details  Name: [name] MRN: [id number] Date of Birth: 1921-01-03  Today's Date: 03/22/2016 Time: SLP Start Time (ACUTE ONLY): 1316 SLP Stop Time (ACUTE ONLY): 1329 SLP Time Calculation (min) (ACUTE ONLY): 13 min  Past Medical History:  Past Medical History:  Diagnosis Date  . Aortic insufficiency Feb 2010   Mild to moderate per echo with normal EF  . Arthritis   . Atrial fibrillation with rapid ventricular response [facility]) Feb 2010  . CAD (coronary artery disease)    2010.  60% RCA, 60 - 70% LAD  . Carotid stenosis    40 - 59% bilateral  . Compression fracture   . Dementia   . Flash pulmonary edema [facility]) Feb 2010   Due to atrial fib with RVR  . Hyperlipidemia   . Hypertension   . IBS (irritable bowel syndrome)   . PUD (peptic ulcer disease)   . Raynaud phenomenon   . Stroke (HCC)   . Vitamin D deficiency    Past Surgical History:  Past Surgical History:  Procedure Laterality Date  . APPENDECTOMY    . CARDIAC CATHETERIZATION  03/11/2008   EF 45%  . TRANSTHORACIC ECHOCARDIOGRAM  03/06/2008   EF 55%   HPI:  Ptis a 83 y.o.femalewith PMH significant of aortic insufficiency, osteoarthritis, paroxysmal atrial fibrillation, CAD, carotid stenosis, dementia, history of compression fracture, hyperlipidemia, HTN, IBS, PUD, Raynaud phenomenon, stroke, and vitamin D deficiency. Came to ED from facility for eval of hypoxia. Per patient's daughter, appetite and sleep have been decreased. Patient complains of cough, occasionally productive of yellowish sputum. CXR from 03/19/16 showed vascular congestion and cardiomegaly. Increased interstitial markings may reflect mild interstitial edema. Alternatively, mildly increased right apical opacity may reflect pneumonia. Chronic calcified bilateral pleural plaques noted. Diffuse aortic atherosclerosis.   Assessment / Plan / Recommendation Clinical Impression  [name] was alert and oriented and  reported that she has not had any difficulties with swallowing (pt's brother agreed). Oral motor assessment was [facility]. Observed pt with thin liquids via cup/straw with no throat clearing or coughing noted at bedside. [name] did not exhibit any oral phase deficits during trials of regular solids. Pt stated that she takes small sips/bites and eats slowly; SLP advised pt to continue utilizing these strategies. Recommended regular solids, thin liquids, meds whole with liquids. No ST treatment needed at this time.  SLP Visit Diagnosis: Dysphagia, unspecified (R13.10)    Aspiration Risk  Mild aspiration risk    Diet Recommendation Regular;Thin liquid   Liquid Administration via: Cup;Straw Medication Administration: Whole meds with liquid Supervision: Patient able to self feed Compensations: Slow rate;Small sips/bites;Minimize environmental distractions Postural Changes: Seated upright at 90 degrees    Other  Recommendations Oral Care Recommendations: Oral care BID   Follow up Recommendations Other (comment) (TBD)      Frequency and Duration            Prognosis        Swallow Study   General HPI: Ptis a 83 y.o.femalewith PMH significant of aortic insufficiency, osteoarthritis, paroxysmal atrial fibrillation, CAD, carotid stenosis, dementia, history of compression fracture, hyperlipidemia, HTN, IBS, PUD, Raynaud phenomenon, stroke, and vitamin D deficiency. Came to ED from facility for eval of hypoxia. Per patient's daughter, appetite and sleep have been decreased. Patient complains of cough, occasionally productive of yellowish sputum. CXR from 03/19/16 showed vascular congestion and cardiomegaly. Increased interstitial markings may reflect mild interstitial edema. Alternatively, mildly increased right apical opacity may reflect pneumonia.  Chronic calcified bilateral pleural plaques noted. Diffuse aortic atherosclerosis. Type of Study: Bedside Swallow Evaluation Previous Swallow  Assessment:  (no) Diet Prior to this Study: Regular;Thin liquids Temperature Spikes Noted: No Respiratory Status: Nasal cannula History of Recent Intubation: No Behavior/Cognition: Alert;Cooperative;Pleasant mood Oral Cavity Assessment: Within Functional Limits Oral Care Completed by SLP: No Oral Cavity - Dentition: Dentures, top;Dentures, bottom Vision: Functional for self-feeding Self-Feeding Abilities: Able to feed self Patient Positioning: Upright in chair Baseline Vocal Quality: Normal Volitional Cough: Strong;Congested Volitional Swallow: Able to elicit    Oral/Motor/Sensory Function Overall Oral Motor/Sensory Function: Within functional limits   Ice Chips Ice chips: Not tested   Thin Liquid Thin Liquid: Within functional limits Presentation: Cup;Straw    Nectar Thick Nectar Thick Liquid: Not tested   Honey Thick Honey Thick Liquid: Not tested   Puree Puree: Not tested   Solid   GO   Solid: Within functional limits Presentation: Self [employer] , Student-SLP 03/22/2016,3:54 PM

## 2016-03-22 NOTE — Progress Notes (Signed)
[facility] can accept patient over the weekend if medically cleared.  CSW will continue to follow  Burna SisJenna H. Makyla Bye, LCSW Clinical Social Worker [phone]

## 2016-03-22 NOTE — Clinical Social Work Note (Signed)
Clinical Social Work Assessment  Patient Details  Name: [name] MRN: [id number] Date of Birth: 06/09/1920  Date of referral:  03/22/16               Reason for consult:  End of Life/Hospice, Facility Placement                Permission sought to share information with:  Family Supports Permission granted to share information::     Name::     Terrall LaityJoyce Freeman  Agency::  SNF  Relationship::  dtr  Contact Information:     Housing/Transportation Living arrangements for the past 2 months:  Skilled Nursing Facility, Single Family Home Source of Information:  Adult Children Patient Interpreter Needed:  None Criminal Activity/Legal Involvement Pertinent to Current Situation/Hospitalization:  No - Comment as needed Significant Relationships:  Adult Children Lives with:  Self Do you feel safe going back to the place where you live?  No Need for family participation in patient care:  Yes (Comment) (help with decisions)  Care giving concerns:  Pt was living at home alone prior to fall last month which led to her admission to [facility].  Pt readmitted to hospital and still not improved enough to return home alone.   Social Worker assessment / plan:  CSW spoke with pt dtr concerning PT continued recommendation for SNF.  Dtr is agreeable to SNF but interested in [facility].  CSW discussed financial concerns (pt will start in copay days at [facility]) and [facility]Medicaid application.  CSW also answered questions for pt dtr regarding palliative care (pt has been very verbal about wanting to die and seems prepared for death- has planned her own funeral this past year).    Employment status:  Retired Database administratornsurance information:  Managed Medicare PT Recommendations:  Skilled Nursing Facility Information / Referral to community resources:  Skilled Nursing Facility  Patient/Family's Response to care:  Dtr is agreeable to continued SNF stay and palliative follow up to better establish patients goals/  wishes.  Patient/Family's Understanding of and Emotional Response to Diagnosis, Current Treatment, and Prognosis:  Pt dtr has good understanding of current care and prognosis and very realistic about pt ability to recover given age.  Emotional Assessment Appearance:  Appears stated age Attitude/Demeanor/Rapport:    Affect (typically observed):    Orientation:  Oriented to Self, Oriented to Place, Oriented to  Time, Oriented to Situation Alcohol / Substance use:  Not Applicable Psych involvement (Current and /or in the community):  No (Comment)  Discharge Needs  Concerns to be addressed:  Care Coordination Readmission within the last 30 days:  Yes Current discharge risk:  Physical Impairment Barriers to Discharge:  Continued Medical Work up   Burna SisUris, Marney Treloar H, LCSW 03/22/2016, 11:55 AM

## 2016-03-23 DIAGNOSIS — J189 Pneumonia, unspecified organism: Secondary | ICD-10-CM

## 2016-03-23 DIAGNOSIS — Z515 Encounter for palliative care: Secondary | ICD-10-CM

## 2016-03-23 MED ORDER — LEVOFLOXACIN 500 MG PO TABS
500.0000 mg | ORAL_TABLET | Freq: Every day | ORAL | Status: DC
  Administered 2016-03-24: 500 mg via ORAL
  Filled 2016-03-23: qty 1

## 2016-03-23 NOTE — Progress Notes (Signed)
[facility] Progress Note  Patient: [name] ZOX:[id number]   PCP: Ezequiel KayserPERINI,MARK A, MD DOB: 06/08/1920   DOA: 03/19/2016   DOS: 03/23/2016   Date of Service: the patient was seen and examined on 03/23/2016   Subjective: Feeling better, no acute complaint. No fever no chills. No chest pain.  Brief hospital course: Pt. with PMH of A. fib, COPD, dementia, HLD, HTN, PUD, CVA; admitted on 03/19/2016, with complaint of shortness of breath, was found to have healthcare associated pneumonia with influenza. Currently further plan is monitor current course.  Assessment and Plan: HCAP (healthcare-associated pneumonia) With influenza, acute hypoxic respiratory failure - Chest x-ray showed vascular congestion, cardiomegaly, increased right apical opacity may reflect pneumonia - Influenza panel positive for influenza A - Blood cultures negative so far. Urine strep antigen still needs to be collected - D-dimer was slightly elevated at 0.77, CT angiogram negative for PE - BNP elevated at 365 - MRSA PCR screen negative, DC vancomycin, cefepime. Change to oral Levaquin. - Daughter requesting palliative care to see patient for goals of care and follow at SNF.   Active Problems: Influenza A - Continue supplemental oxygen and bronchodilators. - Continue Tamiflu   CAD (coronary artery disease) - Continue atorvastatin. - Not on beta blocker or antiplatelet therapy.  Paroxysmal atrial fibrillation (HCC) -CHA2DS2-VASc Score- 7. - Not on anticoagulation at this time, due to prior history of GI bleeding, had a history of ICH and subdural hematoma, falls . - Continue amiodarone 100 mg daily for rate control.  Essential hypertension Continue amlodipine 5 mg daily, valsartan 160 mg daily.  Hyperlipidemia Continue Atorvastatin 20 mg by mouth daily.  Anemia due to chronic blood loss Monitor H&H  Klebsiella PneumoniaUTI (urinary tract infection) - Urine culture showed  Klebsiella, continue only cefepime  Severe malnutrition - appreciate dietitian recommendations  Recent pelvic fracture  - Patient was admitted to a nursing home following a fall and pubic ramus fracture. However she was being discharged from the nursing home and she fell ill with diarrhea and flu. Patient's daughter does not feel that patient will be able to manage at home by herself, she lived alone prior to the fall.  Bowel regimen: last BM 03/20/2016 Diet: Cardiac diet DVT Prophylaxis: subcutaneous Heparin  Advance goals of care discussion: DNR/DNI, palliative care consulted, continue current care without any escalation.  Family Communication: no family was present at bedside, at the time of interview.   Disposition:  Discharge to SNF. Expected discharge date: 03/24/2016, stabilization of respiratory symptoms  Consultants: Palliative care Procedures: None  Antibiotics: Anti-infectives    Start     Dose/Rate Route Frequency Ordered Stop   03/20/16 2200  oseltamivir (TAMIFLU) capsule 30 mg     30 mg Oral 2 times daily 03/20/16 1425 03/25/16 0959   03/20/16 0700  oseltamivir (TAMIFLU) capsule 75 mg  Status:  Discontinued     75 mg Oral 2 times daily 03/20/16 0605 03/20/16 1425   03/20/16 0000  vancomycin (VANCOCIN) IVPB 750 mg/150 ml premix  Status:  Discontinued     750 mg 150 mL/hr over 60 Minutes Intravenous Every 24 hours 03/19/16 2318 03/21/16 1054   03/19/16 2330  ceFEPIme (MAXIPIME) 1 g in dextrose 5 % 50 mL IVPB     1 g 100 mL/hr over 30 Minutes Intravenous Daily at bedtime 03/19/16 2315 03/27/16 2159   03/19/16 2030  piperacillin-tazobactam (ZOSYN) IVPB 3.375 g     3.375 g 100 mL/hr over 30 Minutes Intravenous  Once 03/19/16  2029 03/19/16 2117   03/19/16 2030  vancomycin (VANCOCIN) IVPB 1000 mg/200 mL premix     1,000 mg 200 mL/hr over 60 Minutes Intravenous  Once 03/19/16 2029 03/19/16 2223        Objective: Physical Exam: Vitals:   03/22/16 2114 03/23/16  0445 03/23/16 0828 03/23/16 1330  BP: (!) 119/52 (!) 132/42 (!) 126/45 (!) 107/43  Pulse: 70 60 66 69  Resp: 18 20  20   Temp: 98.6 F (37 C) 98.4 F (36.9 C)  98.9 F (37.2 C)  TempSrc: Oral   Oral  SpO2: 98% 96%  99%  Weight:      Height:        Intake/Output Summary (Last 24 hours) at 03/23/16 1600 Last data filed at 03/23/16 1327  Gross per 24 hour  Intake              320 ml  Output                0 ml  Net              320 ml   Filed Weights   03/20/16 1740  Weight: 47.2 kg (104 lb)    General: Alert, Awake and Oriented to Place and Person. Appear in mild distress, affect appropriate Eyes: PERRL, Conjunctiva normal ENT: Oral Mucosa clear moist. Neck: no JVD, no Abnormal Mass Or lumps Cardiovascular: S1 and S2 Present, aortic systolic Murmur, Respiratory: Bilateral Air entry equal and Decreased, no use of accessory muscle, basal Crackles, no wheezes Abdomen: Bowel Sound present, Soft and no tenderness Skin: no redness, no Rash, no induration Extremities: no Pedal edema, no calf tenderness Neurologic: Grossly no focal neuro deficit. Bilaterally Equal motor strength  Data Reviewed: CBC:  Recent Labs Lab 03/19/16 2026 03/20/16 0724 03/21/16 0445  WBC 10.3 10.5 7.4  NEUTROABS 7.8* 8.0* 5.3  HGB 10.9* 9.6* 10.0*  HCT 34.3* 30.1* 31.8*  MCV 92.5 92.6 94.1  PLT 232 228 240   Basic Metabolic Panel:  Recent Labs Lab 03/19/16 2026 03/20/16 0724 03/21/16 0445 03/22/16 0534  NA 135 136 138 137  K 3.4* 2.9* 3.9 3.7  CL 95* 98* 103 103  CO2 29 27 24 27   GLUCOSE 108* 102* 132* 87  BUN 10 8 11 8   CREATININE 0.55 0.50 0.58 0.49  CALCIUM 8.6* 8.1* 8.5* 8.7*  MG  --   --  1.8  --     Liver Function Tests:  Recent Labs Lab 03/19/16 2026  AST 19  ALT 15  ALKPHOS 114  BILITOT 0.9  PROT 6.7  ALBUMIN 2.9*   No results for input(s): LIPASE, AMYLASE in the last 168 hours. No results for input(s): AMMONIA in the last 168 hours. Coagulation Profile: No  results for input(s): INR, PROTIME in the last 168 hours. Cardiac Enzymes: No results for input(s): CKTOTAL, CKMB, CKMBINDEX, TROPONINI in the last 168 hours. BNP (last 3 results) No results for input(s): PROBNP in the last 8760 hours.  CBG: No results for input(s): GLUCAP in the last 168 hours.  Studies: No results found.   Scheduled Meds: . amiodarone  100 mg Oral Daily  . amLODipine  5 mg Oral Daily  . atorvastatin  20 mg Oral q1800  . ceFEPime (MAXIPIME) IV  1 g Intravenous QHS  . citalopram  20 mg Oral Daily  . enoxaparin (LOVENOX) injection  30 mg Subcutaneous QHS  . feeding supplement  1 Container Oral TID BM  . irbesartan  150  mg Oral Daily  . oseltamivir  30 mg Oral BID  . pantoprazole  40 mg Oral Daily  . sodium chloride flush  3 mL Intravenous Q12H   Continuous Infusions: PRN Meds: acetaminophen, HYDROcodone-acetaminophen, ipratropium-albuterol, ondansetron **OR** ondansetron (ZOFRAN) IV, traZODone  Time spent: 30 minutes  Author: Lynden Oxford, MD [facility] Pager: [phone] 03/23/2016 4:00 PM  If 7PM-7AM, please contact night-coverage at [email or web address], password [facility]

## 2016-03-23 NOTE — Consult Note (Signed)
Consultation Note Date: 03/23/2016   Patient Name: [name]  DOB: [birth year]  MRN: [id number]  Age / Sex: 83 y.o., female  PCP: Rodrigo Ran, MD Referring Physician: Rolly Salter, MD  Reason for Consultation: Establishing goals of care and Psychosocial/spiritual support  HPI/Patient Profile: 83 y.o. female  with past medical history of Aortic insufficiency, atrial fibrillation, coronary artery disease, mild dementia, hard of hearing, hyperlipidemia, hypertension, IBS, stroke admitted on 03/19/2016 with shortness of breath, productive cough and fever. She was found to be positive for influenza type A. Patient recently had been admitted to a nursing home following a fall and a pubic ramus fracture. She had been having difficulty rehabbing and has been very weak and also having diarrhea. Prior to this she had been living in her own home independently..   Clinical Assessment and Goals of Care: Patient is extremely hard of hearing but she is alert and oriented 3. She does have short-term memory deficits and relies heavily on her daughter to help her with communication and decision making. She shares several times in our conversation that she doesn't want anything that would prolong her life. She  also has been struggling with rehabilitation and not sure at this point if she would even wish to pursue that. She acknowledges that she now needs help and does not know if she can live alone anymore. Her daughter has been working on an alternate living environment and has been in close communication, even prior to this admission, with the admissions Interior and spatial designer at [facility]  Her daughter Terrall Laity is her surrogate decision maker. [name] is able to speak for herself but as noted is extremely hard of hearing    SUMMARY OF RECOMMENDATIONS   DO NOT RESUSCITATE Discharged [facility] when medically maximized Daughter  is in close communication with the admissions director at [facility] and after discussing with her we'll make a decision whether this admission is for a skilled need rehabilitation or a residential Patient's desire is not to return to the hospital Discussed at length electing hospice benefit after admission to [facility] after skilled days are utilized or immediately upon residential admission   Code Status/Advance Care Planning:  DNR    Symptom Management:  Dyspnea; continue oxygen as needed nebulizer treatments. Patient may benefit from low-dose morphine concentrate at 2.5 mg every 4 hours as needed for cough and shortness of breath  Palliative Prophylaxis:   Aspiration, Bowel Regimen, Delirium Protocol, Frequent Pain Assessment and Turn Reposition  Additional Recommendations (Limitations, Scope, Preferences):  Avoid Hospitalization, Minimize Medications, Initiate Comfort Feeding, No Artificial Feeding, No Blood Transfusions, No Chemotherapy, No Hemodialysis, No Radiation, No Surgical Procedures and No Tracheostomy  Psycho-social/Spiritual:   Desire for further Chaplaincy support:no  Additional Recommendations: Grief/Bereavement Support  Prognosis:   < 6 months  Discharge Planning: Skilled facility, [facility]. Daughter still deciding on "skilled days" vs residential living      Primary Diagnoses: Present on Admission: . HCAP (healthcare-associated pneumonia) . Anemia due to chronic blood loss .  CAD (coronary artery disease) of artery bypass graft . Essential hypertension . Hyperlipidemia . Paroxysmal atrial fibrillation (HCC) . SIRS (systemic inflammatory response syndrome) (HCC) . UTI (urinary tract infection) . Influenza A   I have reviewed the medical record, interviewed the patient and family, and examined the patient. The following aspects are pertinent.  Past Medical History:  Diagnosis Date  . Aortic insufficiency Feb 2010   Mild to moderate per  echo with normal EF  . Arthritis   . Atrial fibrillation with rapid ventricular response [facility]) Feb 2010  . CAD (coronary artery disease)    2010.  60% RCA, 60 - 70% LAD  . Carotid stenosis    40 - 59% bilateral  . Compression fracture   . Dementia   . Flash pulmonary edema [facility]) Feb 2010   Due to atrial fib with RVR  . Hyperlipidemia   . Hypertension   . IBS (irritable bowel syndrome)   . PUD (peptic ulcer disease)   . Raynaud phenomenon   . Stroke (HCC)   . Vitamin D deficiency    Social History   Social History  . Marital status: Married    Spouse name: N/A  . Number of children: N/A  . Years of education: N/A   Social History Main Topics  . Smoking status: Never Smoker  . Smokeless tobacco: Never Used  . Alcohol use No  . Drug use: No  . Sexual activity: Not Currently   Other Topics Concern  . None   Social History Narrative  . None   Family History  Problem Relation Age of Onset  . Stroke Mother   . Cancer Mother   . Heart attack Father    Scheduled Meds: . amiodarone  100 mg Oral Daily  . amLODipine  5 mg Oral Daily  . atorvastatin  20 mg Oral q1800  . ceFEPime (MAXIPIME) IV  1 g Intravenous QHS  . citalopram  20 mg Oral Daily  . enoxaparin (LOVENOX) injection  30 mg Subcutaneous QHS  . feeding supplement  1 Container Oral TID BM  . irbesartan  150 mg Oral Daily  . oseltamivir  30 mg Oral BID  . pantoprazole  40 mg Oral Daily  . sodium chloride flush  3 mL Intravenous Q12H   Continuous Infusions: PRN Meds:.acetaminophen, HYDROcodone-acetaminophen, ipratropium-albuterol, ondansetron **OR** ondansetron (ZOFRAN) IV, traZODone Medications Prior to Admission:  Prior to Admission medications   Medication Sig Start Date End Date Taking? Authorizing Provider  acetaminophen (TYLENOL) 325 MG tablet Take 325 mg by mouth every 4 (four) hours as needed for moderate pain.   Yes Historical Provider, MD  amiodarone (PACERONE) 200 MG tablet TAKE 1/2 TABLET BY  MOUTH EVERY DAY 06/02/13  Yes Rosalio Macadamia, NP  amLODipine (NORVASC) 5 MG tablet Take 5 mg by mouth daily.  09/09/14  Yes Historical Provider, MD  atorvastatin (LIPITOR) 20 MG tablet Take 20 mg by mouth daily at 6 PM.    Yes Historical Provider, MD  citalopram (CELEXA) 20 MG tablet Take 20 mg by mouth daily.    Yes Historical Provider, MD  denosumab (PROLIA) 60 MG/ML SOLN injection Inject 60 mg into the skin every 6 (six) months. Administer in upper arm, thigh, or abdomen   Yes Historical Provider, MD  feeding supplement, ENSURE ENLIVE, (ENSURE ENLIVE) LIQD Take 237 mLs by mouth 2 (two) times daily between meals. 07/06/14  Yes Evlyn Kanner Love, PA-C  HYDROcodone-acetaminophen (NORCO/VICODIN) 5-325 MG tablet Take 0.5-1 tablets by mouth every  12 (twelve) hours as needed for severe pain. 02/27/16  Yes Marily MemosJason Mesner, MD  magnesium hydroxide (MILK OF MAGNESIA) 400 MG/5ML suspension Take 20 mLs by mouth daily as needed for mild constipation.   Yes Historical Provider, MD  OVER THE COUNTER MEDICATION Take 120 mLs by mouth 3 (three) times daily. Med pass   Yes Historical Provider, MD  pantoprazole (PROTONIX) 40 MG tablet Take 1 tablet (40 mg total) by mouth daily. 07/06/14  Yes Evlyn KannerPamela S Love, PA-C  polyethylene glycol (MIRALAX / GLYCOLAX) packet Take 17 g by mouth daily.    Yes Historical Provider, MD  sennosides-docusate sodium (SENOKOT-S) 8.6-50 MG tablet Take 1 tablet by mouth daily.   Yes Historical Provider, MD  traZODone (DESYREL) 50 MG tablet Take 0.5-1 tablets (25-50 mg total) by mouth at bedtime as needed for sleep. Patient taking differently: Take 25 mg by mouth at bedtime.  07/06/14  Yes Evlyn KannerPamela S Love, PA-C  valsartan (DIOVAN) 160 MG tablet takes 160mg  by mouth once daily 09/19/11  Yes Rollene RotundaJames Hochrein, MD  Vitamin D, Ergocalciferol, (DRISDOL) 50000 UNITS CAPS Take 50,000 Units by mouth every Wednesday.    Yes Historical Provider, MD   Allergies  Allergen Reactions  . Aspirin Other (See Comments)  .  Zoledronic Acid Other (See Comments)    REACTION: Intolerance   Review of Systems  Unable to perform ROS: Other  All other systems reviewed and are negative.   Physical Exam  Constitutional: She is oriented to person, place, and time.  Cachectic frail elderly female. Appears weak and unwell  HENT:  Head: Normocephalic and atraumatic.  Cardiovascular:  Irregular  Pulmonary/Chest: She has rales.  Increased work of breathing  Abdominal: Soft.  Neurological: She is alert and oriented to person, place, and time.  Extremely hard of hearing  Skin: Skin is warm and dry. There is pallor.  Psychiatric: She has a normal mood and affect.  Nursing note and vitals reviewed.   Vital Signs: BP (!) 107/43 (BP Location: Right Arm)   Pulse 69   Temp 98.9 F (37.2 C) (Oral)   Resp 20   Ht 5\' 6"  (1.676 m) Comment: 10/31/15  Wt 47.2 kg (104 lb) Comment: 10/31/15  SpO2 99%   BMI 16.79 kg/m  Pain Assessment: No/denies pain   Pain Score: 0-No pain   SpO2: SpO2: 99 % O2 Device:SpO2: 99 % O2 Flow Rate: .O2 Flow Rate (L/min): 3 L/min  IO: Intake/output summary:  Intake/Output Summary (Last 24 hours) at 03/23/16 1437 Last data filed at 03/23/16 1327  Gross per 24 hour  Intake              320 ml  Output                0 ml  Net              320 ml    LBM: Last BM Date: 03/20/16 Baseline Weight: Weight: 47.2 kg (104 lb) (10/31/15) Most recent weight: Weight: 47.2 kg (104 lb) (10/31/15)     Palliative Assessment/Data:   Flowsheet Rows   Flowsheet Row Most Recent Value  Intake Tab  Referral Department  Hospitalist  Unit at Time of Referral  Med/Surg Unit  Palliative Care Primary Diagnosis  Sepsis/Infectious Disease  Date Notified  03/22/16  Palliative Care Type  New Palliative care  Reason for referral  Clarify Goals of Care  Date of Admission  03/19/16  Date first seen by Palliative Care  03/23/16  # of  days Palliative referral response time  1 Day(s)  # of days IP prior to  Palliative referral  3  Clinical Assessment  Palliative Performance Scale Score  30%  Pain Max last 24 hours  Not able to report  Pain Min Last 24 hours  Not able to report  Dyspnea Max Last 24 Hours  Not able to report  Dyspnea Min Last 24 hours  Not able to report  Nausea Max Last 24 Hours  Not able to report  Nausea Min Last 24 Hours  Not able to report  Anxiety Max Last 24 Hours  Not able to report  Anxiety Min Last 24 Hours  Not able to report  Other Max Last 24 Hours  Not able to report  Psychosocial & Spiritual Assessment  Palliative Care Outcomes  Patient/Family meeting held?  Yes  Who was at the meeting?  daughter  Patient/Family wishes: Interventions discontinued/not started   Mechanical Ventilation, BiPAP, Hemodialysis, Transfusion, Vasopressors, Trach, NIPPV, Tube feedings/TPN, PEG  Palliative Care follow-up planned  No      Time In: 1100 Time Out: 1220 Time Total: 80 min Greater than 50%  of this time was spent counseling and coordinating care related to the above assessment and plan.  Signed by: Irean Hong, NP   Please contact Palliative Medicine Team phone at [phone] for questions and concerns.  For individual provider: See Loretha Stapler

## 2016-03-24 LAB — CULTURE, BLOOD (ROUTINE X 2)
CULTURE: NO GROWTH
Culture: NO GROWTH

## 2016-03-24 IMAGING — CT CT HEAD W/O CM
2 series · 15 of 30 positions shown, 19 images · non-contrast
Comparison: 06/16/2014

CLINICAL DATA: Recent fall yesterday, followup intracranial
hemorrhage

EXAM:
CT HEAD WITHOUT CONTRAST
TECHNIQUE: Contiguous axial images were obtained from the base of the skull
through the vertex without intravenous contrast.

[Series 201: head w/o, idose (1) · axial · non-contrast · 0.49mm/px · z∈[+90,+225]mm · 13 of 33 slices shown, 17 images]
[im 3/33  brain]
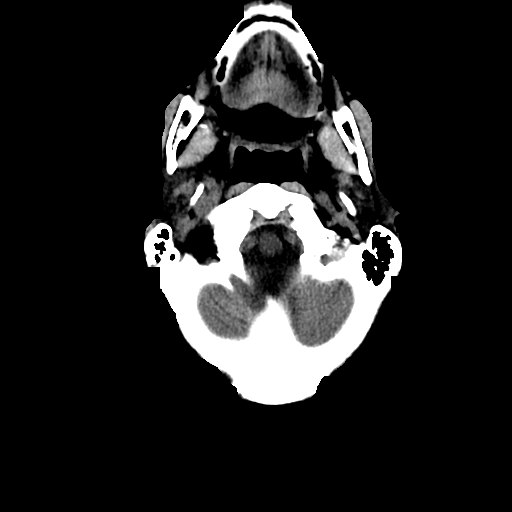
[im 3/33  bone]
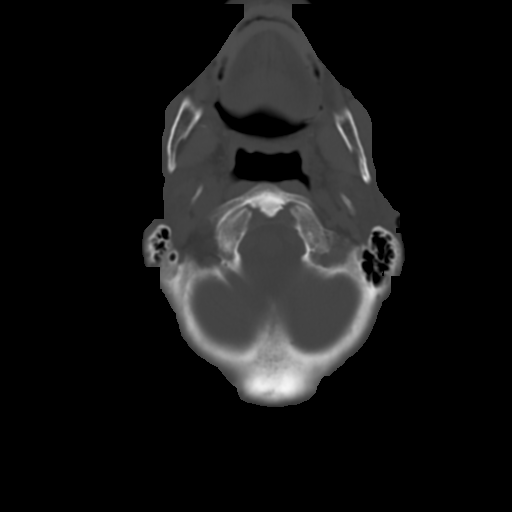
[im 5/33  brain]
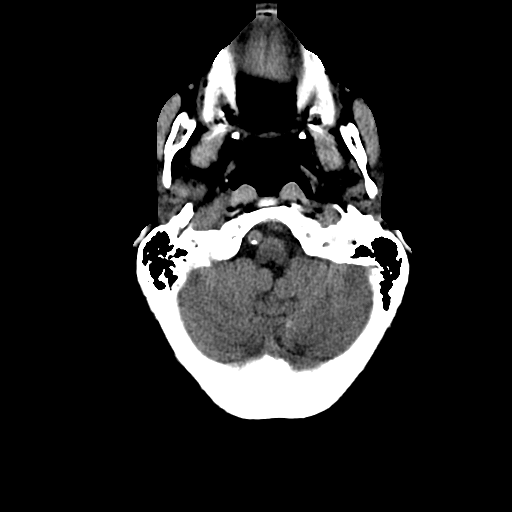
[im 7/33  brain]
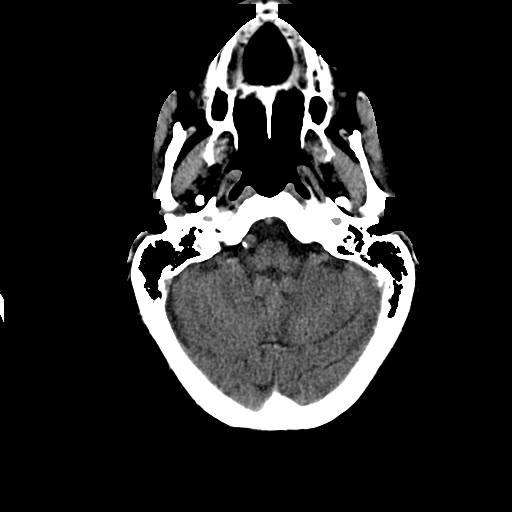
[im 10/33  brain]
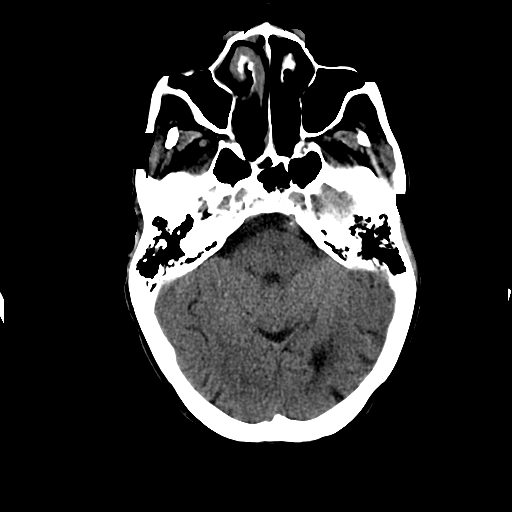
[im 12/33  brain]
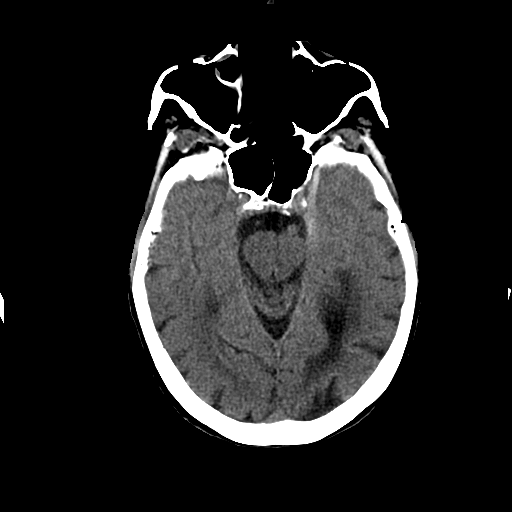
[im 12/33  bone]
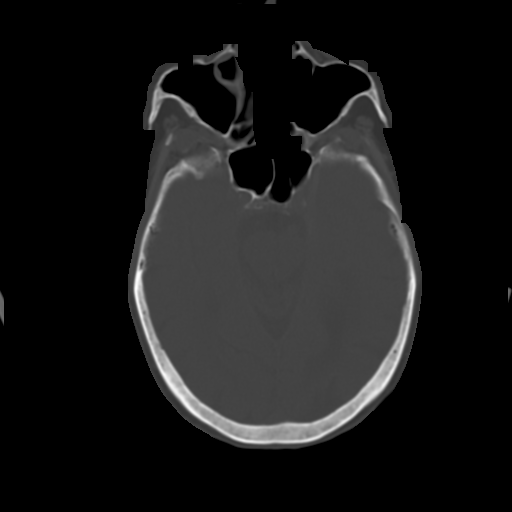
[im 14/33  brain]
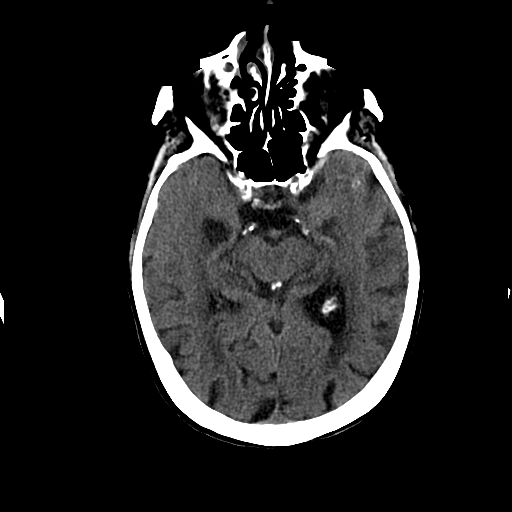
[im 17/33  brain]
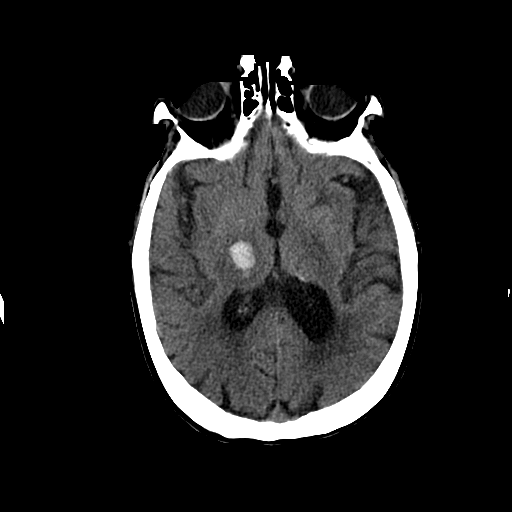
[im 19/33  brain]
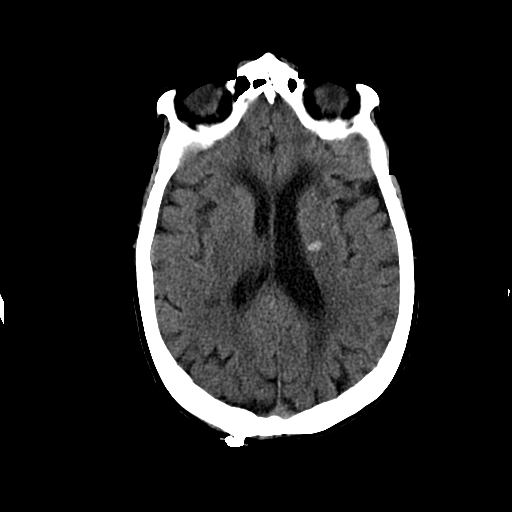
[im 21/33  brain]
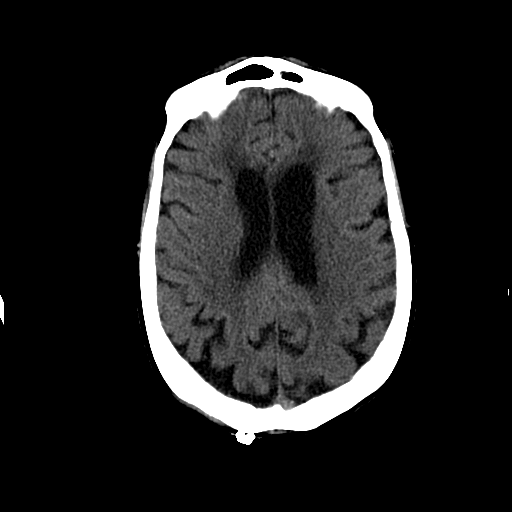
[im 21/33  bone]
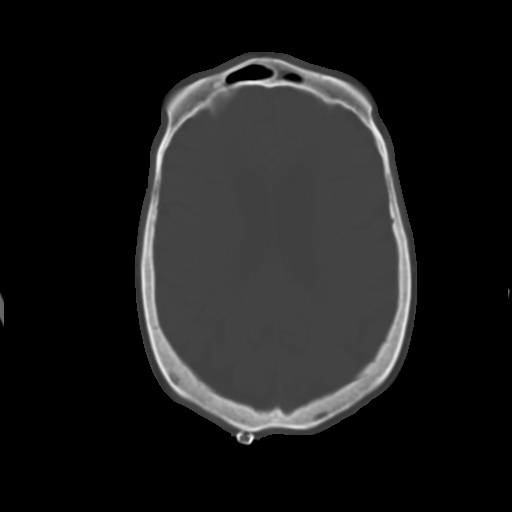
[im 23/33  brain]
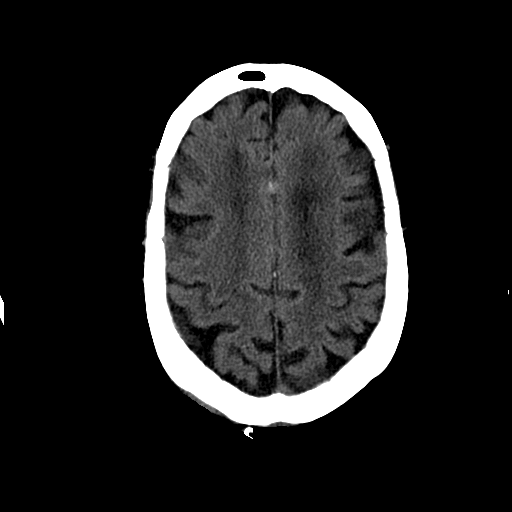
[im 26/33  brain]
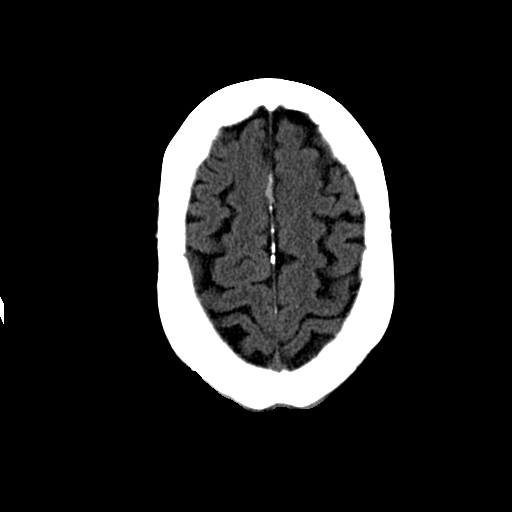
[im 28/33  brain]
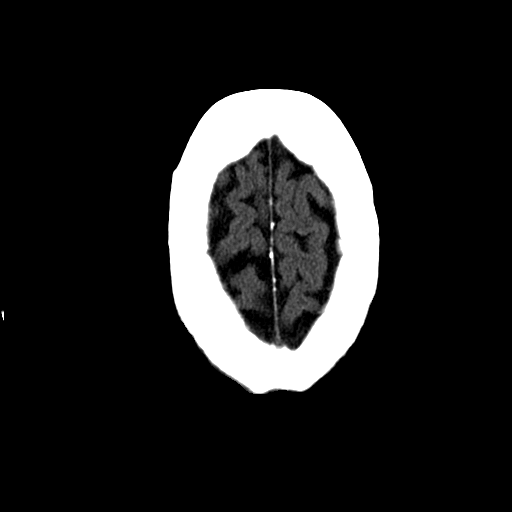
[im 30/33  brain]
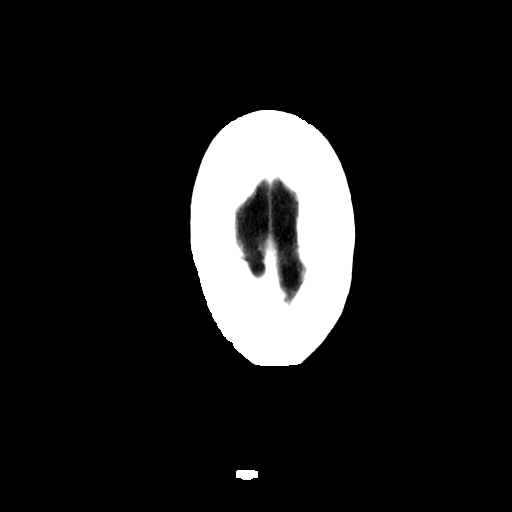
[im 30/33  bone]
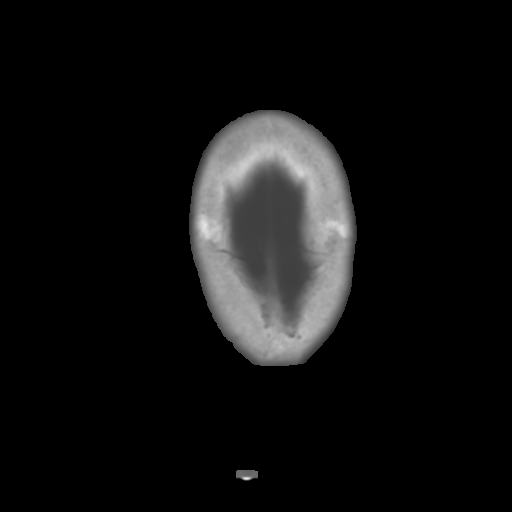

[Series 202: head w/o bone, idose (1) · axial · non-contrast · 0.49mm/px · z∈[+90,+110]mm · 2 of 33 slices shown]
[im 3/33  bone]
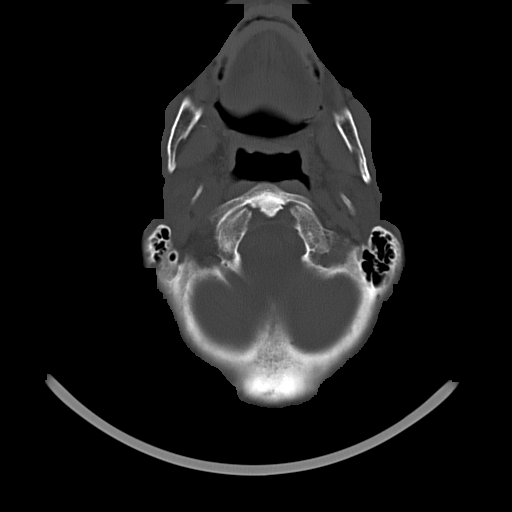
[im 7/33  bone]
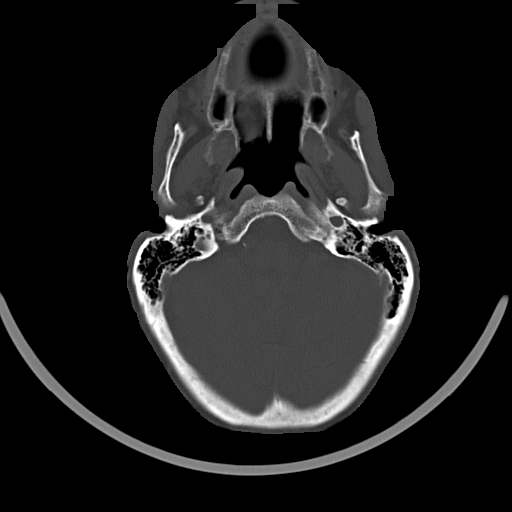

[15 of 30 positions shown; findings below may reference images not displayed]

FINDINGS: The bony calvarium is again intact. Surgical sutures are noted in
the scalp wound posteriorly on the right.

A small area of parenchymal hemorrhage within the left cerebellar
hemisphere as well as within the left centrum semi ovale are again
seen and stable.

Previously seen parafalcine hematoma on the right is again
identified and has increased slightly in the interval from the prior
exam. It now measures 2.9 cm in greatest length increased from
cm on the prior exam. The overall width of the hematoma at 6 mm is
stable when compared with the prior exam. The right thalamic
hemorrhage with surrounding white matter edema is again identified.
The focus of hemorrhage measures 15 mm which is slightly increased
from 13 mm on the prior exam. A small subdural hematoma along the
left temporal lobe medially is again identified and stable. No new
focal infarct or hemorrhage is identified.
IMPRESSION: Persistent changes as described above. There is been slight increase
in the size of right thalamic and right parafalcine hematomas as
described above.

## 2016-03-24 MED ORDER — SENNA-DOCUSATE SODIUM 8.6-50 MG PO TABS: 1.0000 | ORAL_TABLET | Freq: Every evening | ORAL | 0 refills | Status: AC | PRN

## 2016-03-24 MED ORDER — LEVOFLOXACIN 500 MG PO TABS: 500.0000 mg | ORAL_TABLET | Freq: Every day | ORAL | 0 refills | Status: AC

## 2016-03-24 MED ORDER — OSELTAMIVIR PHOSPHATE 30 MG PO CAPS: 30.0000 mg | ORAL_CAPSULE | Freq: Two times a day (BID) | ORAL | 0 refills | Status: DC

## 2016-03-24 MED ORDER — POLYETHYLENE GLYCOL 3350 17 G PO PACK: 17.0000 g | PACK | Freq: Every day | ORAL | 0 refills | Status: DC | PRN

## 2016-03-24 MED ORDER — HYDROCODONE-ACETAMINOPHEN 5-325 MG PO TABS: 0.5000 | ORAL_TABLET | Freq: Two times a day (BID) | ORAL | 0 refills | Status: DC | PRN

## 2016-03-24 NOTE — Progress Notes (Signed)
Nsg Discharge Note  Admit Date:  03/19/2016 Discharge date: 03/24/2016   [facility] Re[facility] to be D/C'd Skilled nursing facility per MD order.  AVS completed.  Copy for chart, and copy for patient signed, and dated. Patient/caregiver able to verbalize understanding.  Discharge Medication: Allergies as of 03/24/2016      Reactions   Aspirin Other (See Comments)   Zoledronic Acid Other (See Comments)   REACTION: Intolerance      Medication List    TAKE these medications   acetaminophen 325 MG tablet Commonly known as:  TYLENOL Take 325 mg by mouth every 4 (four) hours as needed for moderate pain.   amiodarone 200 MG tablet Commonly known as:  PACERONE TAKE 1/2 TABLET BY MOUTH EVERY DAY   amLODipine 5 MG tablet Commonly known as:  NORVASC Take 5 mg by mouth daily.   atorvastatin 20 MG tablet Commonly known as:  LIPITOR Take 20 mg by mouth daily at 6 PM.   citalopram 20 MG tablet Commonly known as:  CELEXA Take 20 mg by mouth daily.   denosumab 60 MG/ML Soln injection Commonly known as:  PROLIA Inject 60 mg into the skin every 6 (six) months. Administer in upper arm, thigh, or abdomen   DIOVAN 160 MG tablet Generic drug:  valsartan takes 160mg  by mouth once daily   feeding supplement (ENSURE ENLIVE) Liqd Take 237 mLs by mouth 2 (two) times daily between meals.   HYDROcodone-acetaminophen 5-325 MG tablet Commonly known as:  NORCO/VICODIN Take 0.5-1 tablets by mouth every 12 (twelve) hours as needed for severe pain.   levofloxacin 500 MG tablet Commonly known as:  LEVAQUIN Take 1 tablet (500 mg total) by mouth daily.   magnesium hydroxide 400 MG/5ML suspension Commonly known as:  MILK OF MAGNESIA Take 20 mLs by mouth daily as needed for mild constipation.   oseltamivir 30 MG capsule Commonly known as:  TAMIFLU Take 1 capsule (30 mg total) by mouth 2 (two) times daily.   OVER THE COUNTER MEDICATION Take 120 mLs by mouth 3 (three) times daily. Med pass    pantoprazole 40 MG tablet Commonly known as:  PROTONIX Take 1 tablet (40 mg total) by mouth daily.   polyethylene glycol packet Commonly known as:  MIRALAX / GLYCOLAX Take 17 g by mouth daily as needed for moderate constipation. What changed:  when to take this  reasons to take this   sennosides-docusate sodium 8.6-50 MG tablet Commonly known as:  SENOKOT-S Take 1 tablet by mouth at bedtime as needed for constipation. What changed:  when to take this  reasons to take this   traZODone 50 MG tablet Commonly known as:  DESYREL Take 0.5-1 tablets (25-50 mg total) by mouth at bedtime as needed for sleep. What changed:  how much to take  when to take this   Vitamin D (Ergocalciferol) 50000 units Caps capsule Commonly known as:  DRISDOL Take 50,000 Units by mouth every Wednesday.       Discharge Assessment: Vitals:   03/23/16 2113 03/24/16 0533  BP: 124/76 (!) 125/46  Pulse: 68 62  Resp: 19 18  Temp: 98.4 F (36.9 C) 98.4 F (36.9 C)   Skin clean, dry and intact without evidence of skin break down, no evidence of skin tears noted. IV catheter discontinued intact. Site without signs and symptoms of complications - no redness or edema noted at insertion site, patient denies c/o pain - only slight tenderness at site.  Dressing with slight pressure applied.  D/c Instructions-Education: Discharge instructions given to patient/family with verbalized understanding. D/c education completed with patient/family including follow up instructions, medication list, d/c activities limitations if indicated, with other d/c instructions as indicated by MD - patient able to verbalize understanding, all questions fully answered. Patient instructed to return to ED, call 911, or call MD for any changes in condition.  Patient escorted via [facility] and D/C to [facility] via private auto with daughter. Report given to nurse at [facility].  Camillo Flaming, RN 03/24/2016 2:01 PM

## 2016-03-24 NOTE — Progress Notes (Addendum)
Pt is medically stable for D/C to [facility] for Palliative Care with rehab.  RN will call daughter when pt for transport.  Pt's daughter will be transporting pt.  [facility] requests that pt not be transported until after 2 pm. Per admissions coordinator at [facility] reports pt will go to room # 408.  Number for report is [phone].  [facility] via the hub.  CSW contacted/spoke to the pt's daughter and they are aware of the D/C plan.   Daughter is in agreement with the plan.  Please reconsult with CSW in future if needed.  CSW signing off.   Dorothe PeaJonathan F. Naim Murtha, LCSWA, LCAS

## 2016-03-24 NOTE — Discharge Summary (Signed)
[facility] Discharge Summary   Patient: [name] ZOX:[id number]   PCP: Ezequiel Kayser, MD DOB: Apr 14, 1920   Date of admission: 03/19/2016   Date of discharge:  03/24/2016    Discharge Diagnoses:  Principal Problem:   HCAP (healthcare-associated pneumonia) Active Problems:   CAD (coronary artery disease) of artery bypass graft   Paroxysmal atrial fibrillation (HCC)   Essential hypertension   Hyperlipidemia   Anemia due to chronic blood loss   SIRS (systemic inflammatory response syndrome) (HCC)   UTI (urinary tract infection)   Influenza A   Protein-calorie malnutrition, severe   Acute lower UTI   Palliative care encounter   Admitted From: SNF Disposition:  SNF with palliative care suppport  Recommendations for Outpatient Follow-up:  1. Please establish care with palliative care at the nursing facility, patient's desire is not to return to the hospital 2. Follow-up with PCP in one week   Contact information for follow-up providers    PERINI,MARK A, MD. Schedule an appointment as soon as possible for a visit in 1 week(s).   Specialty:  Internal Medicine Contact information: [address] [place] [address] [phone]            Contact information for after-discharge care    Destination    [facility] Follow up.   Specialty:  Skilled Nursing Facility Contact information: [address] [place] [address] [phone]                 Diet recommendation: Cardiac diet  Activity: The patient is advised to gradually reintroduce usual activities.  Discharge Condition: good  Code Status: DNR/DNI, discharging to SNF with palliative care support with recommendation of electing hospice benefit if her condition further deteriorates  History of present illness: As per the H and P dictated on admission, " [name] is a 83 y.o. female with medical history significant of aortic insufficiency, osteoarthritis,  paroxysmal atrial fibrillation, coronary artery disease, carotid stenosis, dementia, history of compression fracture, hyperlipidemia, hypertension, IBS, PUD, Raynaud phenomenon, stroke, vitamin D deficiency who is coming to the emergency department from above-mentioned facility for evaluation of hypoxia of 85% on room air. She was admitted to the nursing home following a fall which was complicated by a pubic ramus fracture.  Per patient's daughter, she has been having diarrhea since last week until yesterday. Her appetite and sleep have been decreased. She has not been able to perform physical therapy as usual. She was getting ready to be discharged when she was noticed to have a temperature of 101F and was hypoxic as earlier mentioned. Patient complains of cough, occasionally productive of yellowish sputum, she denies chest pain, palpitations, dizziness, diaphoresis, abdominal pain, emesis and states that she has not had diarrhea today."  Hospital Course:   Summary of her active problems in the hospital is as following. HCAP (healthcare-associated pneumonia) With influenza, acute hypoxic respiratory failure - Chest x-ray showed vascular congestion, cardiomegaly, increased right apical opacity may reflect pneumonia - Influenza panel positive for influenza A - Blood cultures negative so far. Urina strep antigen negative. - D-dimer was slightly elevated at 0.77, CT angiogram negative for PE - BNP elevated at 365 - MRSA PCR screen negative, DC vancomycin, cefepime. Change to oral Levaquin. - Palliative care measures with the patient and recommendation is to establish care with palliative care at the SNF and eventually transitioned to hospice. Per daughter patient has mentioned that she does not feel she is strong enough to do  therapy and is tired.   Influenza A - Continue supplemental oxygen. - Continue Tamiflu, last dose tonight.   CAD (coronary artery disease) - Continue atorvastatin. -  Not on beta blocker or antiplatelet therapy.  Paroxysmal atrial fibrillation (HCC) - CHA2DS2-VASc Score- 7. - Not on anticoagulation at this time, due to prior history of GI bleeding, had a history of ICH and subdural hematoma, falls. - Continue amiodarone 100 mg daily for rate control.  Essential hypertension Continue amlodipine 5 mg daily, valsartan 160 mg daily.  Hyperlipidemia Continue Atorvastatin 20 mg by mouth daily.  Anemia due to chronic blood loss Monitor H&H  Klebsiella PneumoniaUTI (urinary tract infection) - Urine culture showed Klebsiella, continue only cefepime  Severe malnutrition - appreciate dietitian recommendations  Recent pelvic fracture  - Patient was admitted to a nursing home following a fall and pubic ramus fracture. However she was being discharged from the nursing home and she fell ill with diarrhea and flu. Patient's daughter does not feel that patient will be able to manage at home by herself, she lived alone prior to the fall.   All other chronic medical condition were stable during the hospitalization.  Patient was seen by physical therapy, who recommended SNF, which was arranged by Child psychotherapist and case Production designer, theatre/television[employer]. On the day of the discharge the patient's vitals were stable, and no other acute medical condition were reported by patient. the patient was felt safe to be discharge at SNF with therapy and palliative care support.  Procedures and Results:  none   Consultations:  Palliative care  DISCHARGE MEDICATION: Current Discharge Medication List    START taking these medications   Details  levofloxacin (LEVAQUIN) 500 MG tablet Take 1 tablet (500 mg total) by mouth daily. Qty: 2 tablet, Refills: 0    oseltamivir (TAMIFLU) 30 MG capsule Take 1 capsule (30 mg total) by mouth 2 (two) times daily. Qty: 1 capsule, Refills: 0      CONTINUE these medications which have CHANGED   Details  HYDROcodone-acetaminophen  (NORCO/VICODIN) 5-325 MG tablet Take 0.5-1 tablets by mouth every 12 (twelve) hours as needed for severe pain. Qty: 10 tablet, Refills: 0    polyethylene glycol (MIRALAX / GLYCOLAX) packet Take 17 g by mouth daily as needed for moderate constipation. Qty: 14 each, Refills: 0    sennosides-docusate sodium (SENOKOT-S) 8.6-50 MG tablet Take 1 tablet by mouth at bedtime as needed for constipation. Qty: 10 tablet, Refills: 0      CONTINUE these medications which have NOT CHANGED   Details  acetaminophen (TYLENOL) 325 MG tablet Take 325 mg by mouth every 4 (four) hours as needed for moderate pain.    amiodarone (PACERONE) 200 MG tablet TAKE 1/2 TABLET BY MOUTH EVERY DAY Qty: 30 tablet, Refills: 6    amLODipine (NORVASC) 5 MG tablet Take 5 mg by mouth daily.     atorvastatin (LIPITOR) 20 MG tablet Take 20 mg by mouth daily at 6 PM.     citalopram (CELEXA) 20 MG tablet Take 20 mg by mouth daily.     denosumab (PROLIA) 60 MG/ML SOLN injection Inject 60 mg into the skin every 6 (six) months. Administer in upper arm, thigh, or abdomen    feeding supplement, ENSURE ENLIVE, (ENSURE ENLIVE) LIQD Take 237 mLs by mouth 2 (two) times daily between meals. Qty: 237 mL, Refills: 12    magnesium hydroxide (MILK OF MAGNESIA) 400 MG/5ML suspension Take 20 mLs by mouth daily as needed for mild constipation.  OVER THE COUNTER MEDICATION Take 120 mLs by mouth 3 (three) times daily. Med pass    pantoprazole (PROTONIX) 40 MG tablet Take 1 tablet (40 mg total) by mouth daily.    traZODone (DESYREL) 50 MG tablet Take 0.5-1 tablets (25-50 mg total) by mouth at bedtime as needed for sleep.    valsartan (DIOVAN) 160 MG tablet takes 160mg  by mouth once daily    Vitamin D, Ergocalciferol, (DRISDOL) 50000 UNITS CAPS Take 50,000 Units by mouth every Wednesday.        Allergies  Allergen Reactions  . Aspirin Other (See Comments)  . Zoledronic Acid Other (See Comments)    REACTION: Intolerance   Discharge  Instructions    Diet - low sodium heart healthy    Complete by:  As directed    Regular;Thin liquid   Liquid Administration via: Cup;Straw Medication Administration: Whole meds with liquid Supervision: Patient able to self feed Compensations: Slow rate;Small sips/bites;Minimize environmental distractions Postural Changes: Seated upright at 90 degrees   Discharge instructions    Complete by:  As directed    It is important that you read following instructions as well as go over your medication list with RN to help you understand your care after this hospitalization.  Discharge Instructions: Please follow-up with PCP in one week  Please request your primary care physician to go over all Hospital Tests and Procedure/Radiological results at the follow up,  Please get all Hospital records sent to your PCP by signing hospital release before you go home.   Do not take more than prescribed Pain, Sleep and Anxiety Medications. You were cared for by a hospitalist during your hospital stay. If you have any questions about your discharge medications or the care you received while you were in the hospital after you are discharged, you can call the unit and ask to speak with the hospitalist on call if the hospitalist that took care of you is not available.  Once you are discharged, your primary care physician will handle any further medical issues. Please note that NO REFILLS for any discharge medications will be authorized once you are discharged, as it is imperative that you return to your primary care physician (or establish a relationship with a primary care physician if you do not have one) for your aftercare needs so that they can reassess your need for medications and monitor your lab values. You Must read complete instructions/literature along with all the possible adverse reactions/side effects for all the Medicines you take and that have been prescribed to you. Take any new Medicines after you  have completely understood and accept all the possible adverse reactions/side effects. Wear Seat belts while driving.   Increase activity slowly    Complete by:  As directed      Discharge Exam: Filed Weights   03/20/16 1740  Weight: 47.2 kg (104 lb)   Vitals:   03/23/16 2113 03/24/16 0533  BP: 124/76 (!) 125/46  Pulse: 68 62  Resp: 19 18  Temp: 98.4 F (36.9 C) 98.4 F (36.9 C)   General: Appear in no distress, no Rash; Oral Mucosa moist. Cardiovascular: S1 and S2 Present, no Murmur, no JVD Respiratory: Bilateral Air entry present and Clear to Auscultation, basal Crackles, no wheezes Abdomen: Bowel Sound present, Soft and no tenderness Extremities: no Pedal edema, no calf tenderness Neurology: Grossly no focal neuro deficit.  The results of significant diagnostics from this hospitalization (including imaging, microbiology, ancillary and laboratory) are listed below for reference.  Significant Diagnostic Studies: Dg Chest 2 View  Result Date: 03/19/2016 CLINICAL DATA:  Subacute onset of shortness of breath and cough. Initial encounter. EXAM: CHEST  2 VIEW COMPARISON:  Chest radiograph performed 02/27/2016 FINDINGS: The lungs are well-aerated. Chronic calcified pleural plaques are seen bilaterally. Vascular congestion is noted. Increased interstitial markings may reflect mild interstitial edema. Alternatively, mildly increased right apical opacity may reflect pneumonia. There is no evidence of pleural effusion or pneumothorax. The heart is enlarged. No acute osseous abnormalities are seen. Diffuse calcification is seen along the thoracic and proximal abdominal aorta. IMPRESSION: 1. Vascular congestion and cardiomegaly. Increased interstitial markings may reflect mild interstitial edema. Alternatively, mildly increased right apical opacity may reflect pneumonia. 2. Chronic calcified bilateral pleural plaques noted. 3. Diffuse aortic atherosclerosis. Electronically Signed   By: Roanna Raider M.D.   On: 03/19/2016 20:04   Dg Chest 2 View  Result Date: 02/27/2016 CLINICAL DATA:  Recent fall with left hip pain EXAM: CHEST  2 VIEW COMPARISON:  06/24/2014 FINDINGS: Lungs are well aerated bilaterally with chronic calcific changes in the apices bilaterally. The cardiac shadow is stable. Mild scoliosis of the thoracic spine is again seen and stable. No focal infiltrate, effusion or pneumothorax is seen. No acute bony abnormality is noted. IMPRESSION: Chronic pleural and parenchymal calcifications in the apices bilaterally. No acute abnormality is seen. Electronically Signed   By: Alcide Clever M.D.   On: 02/27/2016 09:14   Ct Angio Chest Pe W Or Wo Contrast  Result Date: 03/20/2016 CLINICAL DATA:  Hypoxia, question pulmonary embolism EXAM: CT ANGIOGRAPHY CHEST WITH CONTRAST TECHNIQUE: Multidetector CT imaging of the chest was performed using the standard protocol during bolus administration of intravenous contrast. Multiplanar CT image reconstructions and MIPs were obtained to evaluate the vascular anatomy. CONTRAST:  100 cc Isovue 370 IV COMPARISON:  None FINDINGS: Cardiovascular: Extensive atherosclerotic calcifications aorta, coronary arteries and proximal great vessels. Aorta normal caliber without aneurysm or dissection. Enlargement of cardiac chambers. No significant pericardial effusion. Mediastinum/Nodes: No thoracic adenopathy. Cervical base unremarkable. Esophagus normal appearance. Lungs/Pleura: Small BILATERAL pleural effusions. Dependent atelectasis in both lungs. Calcified granulomata bilaterally with calcified pleural plaques. Minimal patchy bibasilar infiltrate. No pneumothorax. Underlying emphysematous changes. Upper Abdomen: Unremarkable Musculoskeletal: No acute osseous findings. Diffuse osseous demineralization. Review of the MIP images confirms the above findings. IMPRESSION: No evidence of pulmonary embolism. Changes of COPD and old granulomatous disease with minimal patchy  bibasilar infiltrates. Calcified pleural plaque disease bilaterally question asbestos exposure. Aortic atherosclerosis and coronary arterial calcifications. Electronically Signed   By: Ulyses Southward M.D.   On: 03/20/2016 13:24   Dg Hip Unilat W Or Wo Pelvis 2-3 Views Left  Result Date: 02/27/2016 CLINICAL DATA:  Recent fall with left hip pain, initial encounter EXAM: DG HIP (WITH OR WITHOUT PELVIS) 2-3V LEFT COMPARISON:  None. FINDINGS: Proximal left femur is within normal limits. Irregularity is noted along the superior pubic ramus on the left consistent with an undisplaced fracture. No other focal abnormality is noted. No soft tissue changes are seen. IMPRESSION: Left superior pubic ramus fracture Electronically Signed   By: Alcide Clever M.D.   On: 02/27/2016 09:13    Microbiology: Recent Results (from the past 240 hour(s))  Gastrointestinal Panel by PCR , Stool     Status: None   Collection Time: 03/19/16  7:44 PM  Result Value Ref Range Status   Campylobacter species NOT DETECTED NOT DETECTED Final   Plesimonas shigelloides NOT DETECTED NOT DETECTED Final   Salmonella  species NOT DETECTED NOT DETECTED Final   Yersinia enterocolitica NOT DETECTED NOT DETECTED Final   Vibrio species NOT DETECTED NOT DETECTED Final   Vibrio cholerae NOT DETECTED NOT DETECTED Final   Enteroaggregative E coli (EAEC) NOT DETECTED NOT DETECTED Final   Enteropathogenic E coli (EPEC) NOT DETECTED NOT DETECTED Final   Enterotoxigenic E coli (ETEC) NOT DETECTED NOT DETECTED Final   Shiga like toxin producing E coli (STEC) NOT DETECTED NOT DETECTED Final   Shigella/Enteroinvasive E coli (EIEC) NOT DETECTED NOT DETECTED Final   Cryptosporidium NOT DETECTED NOT DETECTED Final   Cyclospora cayetanensis NOT DETECTED NOT DETECTED Final   Entamoeba histolytica NOT DETECTED NOT DETECTED Final   Giardia lamblia NOT DETECTED NOT DETECTED Final   Adenovirus F40/41 NOT DETECTED NOT DETECTED Final   Astrovirus NOT DETECTED NOT  DETECTED Final   Norovirus GI/GII NOT DETECTED NOT DETECTED Final   Rotavirus A NOT DETECTED NOT DETECTED Final   Sapovirus (I, II, IV, and V) NOT DETECTED NOT DETECTED Final  C difficile quick scan w PCR reflex     Status: None   Collection Time: 03/19/16  7:44 PM  Result Value Ref Range Status   C Diff antigen NEGATIVE NEGATIVE Final   C Diff toxin NEGATIVE NEGATIVE Final   C Diff interpretation No C. difficile detected.  Final  Culture, blood (Routine X 2) w Reflex to ID Panel     Status: None (Preliminary result)   Collection Time: 03/19/16  8:26 PM  Result Value Ref Range Status   Specimen Description BLOOD RIGHT ANTECUBITAL  Final   Special Requests BOTTLES DRAWN AEROBIC AND ANAEROBIC 5CC  Final   Culture NO GROWTH 4 DAYS  Final   Report Status PENDING  Incomplete  Culture, blood (Routine X 2) w Reflex to ID Panel     Status: None (Preliminary result)   Collection Time: 03/19/16  9:41 PM  Result Value Ref Range Status   Specimen Description BLOOD RIGHT HAND  Final   Special Requests IN PEDIATRIC BOTTLE  Final   Culture NO GROWTH 4 DAYS  Final   Report Status PENDING  Incomplete  MRSA PCR Screening     Status: None   Collection Time: 03/19/16 11:53 PM  Result Value Ref Range Status   MRSA by PCR NEGATIVE NEGATIVE Final    Comment:        The GeneXpert MRSA Assay (FDA approved for NASAL specimens only), is one component of a comprehensive MRSA colonization surveillance program. It is not intended to diagnose MRSA infection nor to guide or monitor treatment for MRSA infections.   Urine culture     Status: Abnormal   Collection Time: 03/20/16 12:21 AM  Result Value Ref Range Status   Specimen Description URINE, CLEAN CATCH  Final   Special Requests NONE  Final   Culture 60,000 COLONIES/mL KLEBSIELLA PNEUMONIAE (A)  Final   Report Status 03/22/2016 FINAL  Final   Organism ID, Bacteria KLEBSIELLA PNEUMONIAE (A)  Final      Susceptibility   Klebsiella pneumoniae -  MIC*    AMPICILLIN RESISTANT Resistant     CEFAZOLIN <=4 SENSITIVE Sensitive     CEFTRIAXONE <=1 SENSITIVE Sensitive     CIPROFLOXACIN <=0.25 SENSITIVE Sensitive     GENTAMICIN <=1 SENSITIVE Sensitive     IMIPENEM <=0.25 SENSITIVE Sensitive     NITROFURANTOIN 64 INTERMEDIATE Intermediate     TRIMETH/SULFA <=20 SENSITIVE Sensitive     AMPICILLIN/SULBACTAM 4 SENSITIVE Sensitive  PIP/TAZO <=4 SENSITIVE Sensitive     Extended ESBL NEGATIVE Sensitive     * 60,000 COLONIES/mL KLEBSIELLA PNEUMONIAE     Labs: CBC:  Recent Labs Lab 03/19/16 2026 03/20/16 0724 03/21/16 0445  WBC 10.3 10.5 7.4  NEUTROABS 7.8* 8.0* 5.3  HGB 10.9* 9.6* 10.0*  HCT 34.3* 30.1* 31.8*  MCV 92.5 92.6 94.1  PLT 232 228 240   Basic Metabolic Panel:  Recent Labs Lab 03/19/16 2026 03/20/16 0724 03/21/16 0445 03/22/16 0534  NA 135 136 138 137  K 3.4* 2.9* 3.9 3.7  CL 95* 98* 103 103  CO2 29 27 24 27   GLUCOSE 108* 102* 132* 87  BUN 10 8 11 8   CREATININE 0.55 0.50 0.58 0.49  CALCIUM 8.6* 8.1* 8.5* 8.7*  MG  --   --  1.8  --    Liver Function Tests:  Recent Labs Lab 03/19/16 2026  AST 19  ALT 15  ALKPHOS 114  BILITOT 0.9  PROT 6.7  ALBUMIN 2.9*   BNP (last 3 results)  Recent Labs  03/19/16 2026  BNP 365.7*   CBG: No results for input(s): GLUCAP in the last 168 hours. Time spent: 45 minutes  Signed:  Edit Ricciardelli  [facility]  03/24/2016  , 10:42 AM

## 2016-04-02 ENCOUNTER — Encounter: Payer: Self-pay | Admitting: Internal Medicine

## 2016-04-02 ENCOUNTER — Non-Acute Institutional Stay (SKILLED_NURSING_FACILITY): Payer: Medicare Other | Admitting: Internal Medicine

## 2016-04-02 DIAGNOSIS — B9689 Other specified bacterial agents as the cause of diseases classified elsewhere: Secondary | ICD-10-CM

## 2016-04-02 DIAGNOSIS — F329 Major depressive disorder, single episode, unspecified: Secondary | ICD-10-CM

## 2016-04-02 DIAGNOSIS — G4700 Insomnia, unspecified: Secondary | ICD-10-CM | POA: Diagnosis not present

## 2016-04-02 DIAGNOSIS — F32A Depression, unspecified: Secondary | ICD-10-CM

## 2016-04-02 DIAGNOSIS — J111 Influenza due to unidentified influenza virus with other respiratory manifestations: Secondary | ICD-10-CM | POA: Diagnosis not present

## 2016-04-02 DIAGNOSIS — Z8781 Personal history of (healed) traumatic fracture: Secondary | ICD-10-CM

## 2016-04-02 DIAGNOSIS — I25701 Atherosclerosis of coronary artery bypass graft(s), unspecified, with angina pectoris with documented spasm: Secondary | ICD-10-CM

## 2016-04-02 DIAGNOSIS — I10 Essential (primary) hypertension: Secondary | ICD-10-CM | POA: Diagnosis not present

## 2016-04-02 DIAGNOSIS — I480 Paroxysmal atrial fibrillation: Secondary | ICD-10-CM | POA: Diagnosis not present

## 2016-04-02 DIAGNOSIS — R5381 Other malaise: Secondary | ICD-10-CM

## 2016-04-02 DIAGNOSIS — N390 Urinary tract infection, site not specified: Secondary | ICD-10-CM

## 2016-04-02 DIAGNOSIS — J189 Pneumonia, unspecified organism: Secondary | ICD-10-CM

## 2016-04-02 DIAGNOSIS — K219 Gastro-esophageal reflux disease without esophagitis: Secondary | ICD-10-CM | POA: Diagnosis not present

## 2016-04-02 DIAGNOSIS — D638 Anemia in other chronic diseases classified elsewhere: Secondary | ICD-10-CM | POA: Diagnosis not present

## 2016-04-02 DIAGNOSIS — K5900 Constipation, unspecified: Secondary | ICD-10-CM

## 2016-04-02 DIAGNOSIS — B961 Klebsiella pneumoniae [K. pneumoniae] as the cause of diseases classified elsewhere: Secondary | ICD-10-CM

## 2016-04-02 NOTE — Progress Notes (Signed)
LOCATION: [facility]  PCP: Ezequiel Kayser, MD   Code Status: DNR  Goals of care: Advanced Directive information Advanced Directives 04/02/2016  Does Patient Have a Medical Advance Directive? Yes  Type of Advance Directive Out of facility DNR (pink MOST or yellow form)  Does patient want to make changes to medical advance directive? No - Patient declined  Copy of Healthcare Power of Attorney in Chart? -       Extended Emergency Contact Information Primary Emergency Contact: Terrall Laity  [phone] [place] of [place] Home Phone: [phone] Relation: Daughter Secondary Emergency Contact: Tolentino,Christian  [place] Phone: [phone] Relation: Grandaughter   Allergies  Allergen Reactions  . Aspirin Other (See Comments)  . Zoledronic Acid Other (See Comments)    REACTION: Intolerance    Chief Complaint  Patient presents with  . New Admit To SNF    New Admission Visit      HPI:  Patient is a 83 y.o. female seen today for short term rehabilitation post hospital admission from 03/19/16-03/24/16 With acute respiratory failure with hypoxia. She was noted to have healthcare acquired pneumonia and influenza. She was placed on antibiotic and antiviral medication. Pulmonary embolism was ruled out. She was also treated for Klebsiella urinary tract infection. Of note patient has history of recent fall with pelvic fracture following which she was in a nursing home prior to this hospitalization. She has medical history of aortic insufficiency, paroxysmal atrial fibrillation, coronary artery disease, dementia, hypertension, irritable bowel syndrome, peptic ulcer disease among others.  Review of Systems:  Constitutional: Negative for fever, chills, diaphoresis. Energy level is slowly coming back. HENT: Negative for headache, congestion, nasal discharge, difficulty swallowing.   Eyes: Negative for eye pain, blurred vision, double vision and discharge.  Wears glasses. Respiratory: Negative for cough, shortness of breath and wheezing.   Cardiovascular: Negative for chest pain, palpitations, leg swelling.  Gastrointestinal: Negative for nausea, vomiting, abdominal pain, loss of appetite. Positive for occasional heartburn. Last bowel movement was yesterday.  Genitourinary: Negative for dysuria and flank pain.  Musculoskeletal: Negative for back pain, fall in the facility.  Skin: Negative for itching, rash.  Neurological: Positive for chronic numbness and tingling to both feet. Positive for dizziness with sudden change of position. Psychiatric/Behavioral: Negative for depression.   Past Medical History:  Diagnosis Date  . Aortic insufficiency Feb 2010   Mild to moderate per echo with normal EF  . Arthritis   . Atrial fibrillation with rapid ventricular response [facility]) Feb 2010  . CAD (coronary artery disease)    2010.  60% RCA, 60 - 70% LAD  . Carotid stenosis    40 - 59% bilateral  . Compression fracture   . Dementia   . Flash pulmonary edema [facility]) Feb 2010   Due to atrial fib with RVR  . Hyperlipidemia   . Hypertension   . IBS (irritable bowel syndrome)   . PUD (peptic ulcer disease)   . Raynaud phenomenon   . Stroke (HCC)   . Vitamin D deficiency    Past Surgical History:  Procedure Laterality Date  . APPENDECTOMY    . CARDIAC CATHETERIZATION  03/11/2008   EF 45%  . TRANSTHORACIC ECHOCARDIOGRAM  03/06/2008   EF 55%   Social History:   reports that she has never smoked. She has never used smokeless tobacco. She reports that she does not drink alcohol or use drugs.  Family History  Problem Relation Age of Onset  . Stroke Mother   .  Cancer Mother   . Heart attack Father     Medications: Allergies as of 04/02/2016      Reactions   Aspirin Other (See Comments)   Zoledronic Acid Other (See Comments)   REACTION: Intolerance      Medication List       Accurate as of 04/02/16  1:01 PM. Always use your most recent med  list.          acetaminophen 325 MG tablet Commonly known as:  TYLENOL Take 325 mg by mouth every 4 (four) hours as needed for moderate pain.   amiodarone 200 MG tablet Commonly known as:  PACERONE TAKE 1/2 TABLET BY MOUTH EVERY DAY   amLODipine 5 MG tablet Commonly known as:  NORVASC Take 5 mg by mouth daily.   atorvastatin 20 MG tablet Commonly known as:  LIPITOR Take 20 mg by mouth daily at 6 PM.   citalopram 20 MG tablet Commonly known as:  CELEXA Take 20 mg by mouth daily.   denosumab 60 MG/ML Soln injection Commonly known as:  PROLIA Inject 60 mg into the skin every 6 (six) months. Administer in upper arm, thigh, or abdomen   DIOVAN 160 MG tablet Generic drug:  valsartan takes 160mg  by mouth once daily   feeding supplement (PRO-STAT SUGAR FREE 64) Liqd Take 30 mLs by mouth 2 (two) times daily. Stop date 04/27/16   HYDROcodone-acetaminophen 5-325 MG tablet Commonly known as:  NORCO/VICODIN Take 0.5-1 tablets by mouth every 12 (twelve) hours as needed for severe pain.   magnesium hydroxide 400 MG/5ML suspension Commonly known as:  MILK OF MAGNESIA Take 20 mLs by mouth daily as needed for mild constipation.   OVER THE COUNTER MEDICATION Take 60 mLs by mouth 2 (two) times daily. Med pass   pantoprazole 40 MG tablet Commonly known as:  PROTONIX Take 1 tablet (40 mg total) by mouth daily.   polyethylene glycol packet Commonly known as:  MIRALAX / GLYCOLAX Take 17 g by mouth daily as needed for moderate constipation.   sennosides-docusate sodium 8.6-50 MG tablet Commonly known as:  SENOKOT-S Take 1 tablet by mouth at bedtime as needed for constipation.   traZODone 50 MG tablet Commonly known as:  DESYREL Take 25 mg by mouth at bedtime.   Vitamin D (Ergocalciferol) 50000 units Caps capsule Commonly known as:  DRISDOL Take 50,000 Units by mouth every Wednesday.       Immunizations: Immunization History  Administered Date(s) Administered  . PPD Test  03/25/2016     Physical Exam: Vitals:   04/02/16 1245  BP: 106/68  Pulse: 86  Resp: 18  Temp: 98.1 F (36.7 C)  TempSrc: Oral  SpO2: 97%  Height: 5\' 3"  (1.6 m)    General- elderly female, frail and thin built, in no acute distress Head- normocephalic, atraumatic Nose- no nasal discharge Throat- moist mucus membrane Eyes- PERRLA, EOMI, no pallor, no icterus, no discharge Neck- no cervical lymphadenopathy Cardiovascular- normal s1,s2, no murmur Respiratory- bilateral clear to auscultation, no wheeze, no rhonchi, no crackles, no use of accessory muscles Abdomen- bowel sounds present, soft, non tender, no guarding or rigidity Musculoskeletal- able to move all 4 extremities, generalized weakness, kyphosis +, arthritis changes to her fingers, no leg edema Neurological- alert and oriented to person, place and time Skin- warm and dry Psychiatry- normal mood and affect    Labs reviewed: Basic Metabolic Panel:  Recent Labs  16/10/96 0724 03/21/16 0445 03/22/16 0534  NA 136 138 137  K 2.9*  3.9 3.7  CL 98* 103 103  CO2 27 24 27   GLUCOSE 102* 132* 87  BUN 8 11 8   CREATININE 0.50 0.58 0.49  CALCIUM 8.1* 8.5* 8.7*  MG  --  1.8  --    Liver Function Tests:  Recent Labs  06/07/15 1036 10/31/15 1406 03/19/16 2026  AST 13 16 19   ALT 12 12 15   ALKPHOS 61 61 114  BILITOT 0.4 0.4 0.9  PROT 6.9 6.9 6.7  ALBUMIN 3.9 3.9 2.9*   No results for input(s): LIPASE, AMYLASE in the last 8760 hours. No results for input(s): AMMONIA in the last 8760 hours. CBC:  Recent Labs  03/19/16 2026 03/20/16 0724 03/21/16 0445  WBC 10.3 10.5 7.4  NEUTROABS 7.8* 8.0* 5.3  HGB 10.9* 9.6* 10.0*  HCT 34.3* 30.1* 31.8*  MCV 92.5 92.6 94.1  PLT 232 228 240   Cardiac Enzymes: No results for input(s): CKTOTAL, CKMB, CKMBINDEX, TROPONINI in the last 8760 hours. BNP: Invalid input(s): POCBNP CBG:  Recent Labs  02/27/16 0843  GLUCAP 87    Radiological Exams: Dg Chest 2  View  Result Date: 03/19/2016 CLINICAL DATA:  Subacute onset of shortness of breath and cough. Initial encounter. EXAM: CHEST  2 VIEW COMPARISON:  Chest radiograph performed 02/27/2016 FINDINGS: The lungs are well-aerated. Chronic calcified pleural plaques are seen bilaterally. Vascular congestion is noted. Increased interstitial markings may reflect mild interstitial edema. Alternatively, mildly increased right apical opacity may reflect pneumonia. There is no evidence of pleural effusion or pneumothorax. The heart is enlarged. No acute osseous abnormalities are seen. Diffuse calcification is seen along the thoracic and proximal abdominal aorta. IMPRESSION: 1. Vascular congestion and cardiomegaly. Increased interstitial markings may reflect mild interstitial edema. Alternatively, mildly increased right apical opacity may reflect pneumonia. 2. Chronic calcified bilateral pleural plaques noted. 3. Diffuse aortic atherosclerosis. Electronically Signed   By: Roanna Raider M.D.   On: 03/19/2016 20:04   Ct Angio Chest Pe W Or Wo Contrast  Result Date: 03/20/2016 CLINICAL DATA:  Hypoxia, question pulmonary embolism EXAM: CT ANGIOGRAPHY CHEST WITH CONTRAST TECHNIQUE: Multidetector CT imaging of the chest was performed using the standard protocol during bolus administration of intravenous contrast. Multiplanar CT image reconstructions and MIPs were obtained to evaluate the vascular anatomy. CONTRAST:  100 cc Isovue 370 IV COMPARISON:  None FINDINGS: Cardiovascular: Extensive atherosclerotic calcifications aorta, coronary arteries and proximal great vessels. Aorta normal caliber without aneurysm or dissection. Enlargement of cardiac chambers. No significant pericardial effusion. Mediastinum/Nodes: No thoracic adenopathy. Cervical base unremarkable. Esophagus normal appearance. Lungs/Pleura: Small BILATERAL pleural effusions. Dependent atelectasis in both lungs. Calcified granulomata bilaterally with calcified pleural  plaques. Minimal patchy bibasilar infiltrate. No pneumothorax. Underlying emphysematous changes. Upper Abdomen: Unremarkable Musculoskeletal: No acute osseous findings. Diffuse osseous demineralization. Review of the MIP images confirms the above findings. IMPRESSION: No evidence of pulmonary embolism. Changes of COPD and old granulomatous disease with minimal patchy bibasilar infiltrates. Calcified pleural plaque disease bilaterally question asbestos exposure. Aortic atherosclerosis and coronary arterial calcifications. Electronically Signed   By: Ulyses Southward M.D.   On: 03/20/2016 13:24    Assessment/Plan   Physical deconditioning From generalized weakness. Will have her work with physical therapy and occupational therapy team to help with gait training and muscle strengthening exercises.fall precautions. Skin care. Encourage to be out of bed.   Healthcare acquired pneumonia With acute respiratory failure requiring hospital admission. She has completed her antibiotic course. Breathing remains stable. Monitor clinically  influenza Has completed her course of Tamiflu.  Breathing currently stable. Continue using flutter valve to prevent atelectasis.  Klebsiella UTI Currently asymptomatic and has completed her antibiotic. Perineal hygiene and hydration to be maintained.  PCM Severe, followed by RD and is on protein supplement, encourage oral intake.  History of Pelvic fracture Currently on Tylenol 325 mg every 4 hours as needed for pain. She is also on hydrocodone-acetaminophen 5-325 milligrams half to 1 tablet every 12 hours as needed for moderate to severe pain. She denies any pain this visit. Continue her vitamin D supplement once a week. Discontinue hydrocodone-acetaminophen given her age and high fall risk.  Atrial fibrillation Controlled heart rate. Continue Pacerone for now, no changes made. Of anticoagulation given her fall risk  Hypertension Blood pressure readings low/ soft on  review. Patient also complains of occasional dizziness with change of position. Discontinue her Norvasc and monitor blood pressure reading q shift for now. Continue her Diovan 160 mg daily.   Coronary artery disease Remains chest pain-free. Continue Diovan, Lipitor  Anemia of chronic disease With multiple medical comorbidities. Monitor CBC periodically  Insomnia Continue trazodone 25 mg at bedtime and monitor  Constipation Continue senna docusate daily at bedtime as needed with MiraLAX on a needed basis and monitor  GERD Continue Protonix 40 mg daily, symptoms have been controlled  Chronic depression Continue Celexa 20 mg daily, Motrin female stable    Goals of care: short term rehabilitation   Labs/tests ordered: cbc, bmp  Family/ staff Communication: reviewed care plan with patient and nursing supervisor  I have spent more than 50 minutes in this visit and greater than 50% of the time spent in reviewing, counselling and coordinating care for patient and addressing above mentioned medical problem.    Oneal GroutMAHIMA Cross Jorge, MD Internal Medicine [facility] [address] [place], [place] [address] Cell Phone (Monday-Friday 8 am - 5 pm): [phone] On Call: [phone] and follow prompts after 5 pm and on weekends Office Phone: [phone] Office Fax: [phone]

## 2016-04-03 LAB — CBC AND DIFFERENTIAL
HCT: 34 % — AB (ref 36–46)
HEMOGLOBIN: 10.7 g/dL — AB (ref 12.0–16.0)
Platelets: 275 10*3/uL (ref 150–399)
WBC: 5.2 10^3/mL

## 2016-04-03 LAB — BASIC METABOLIC PANEL
BUN: 13 mg/dL (ref 4–21)
Creatinine: 0.6 mg/dL (ref 0.5–1.1)
Glucose: 85 mg/dL
POTASSIUM: 3.6 mmol/L (ref 3.4–5.3)
SODIUM: 144 mmol/L (ref 137–147)

## 2016-04-09 ENCOUNTER — Ambulatory Visit: Payer: Medicare Other | Admitting: Nurse Practitioner

## 2016-04-17 ENCOUNTER — Telehealth: Payer: Self-pay | Admitting: Cardiology

## 2016-04-17 NOTE — Telephone Encounter (Signed)
Message routed to MD as FYI.

## 2016-04-17 NOTE — Telephone Encounter (Signed)
New Message  Pts daughter voiced to tell MD & NP pt is in nursing home and health is declining.  Pts daugther voiced if you need to access records you can.  Please f/u if needed

## 2016-05-06 ENCOUNTER — Encounter: Payer: Self-pay | Admitting: Family

## 2016-05-06 ENCOUNTER — Non-Acute Institutional Stay (SKILLED_NURSING_FACILITY): Payer: Medicare Other | Admitting: Family

## 2016-05-06 DIAGNOSIS — E782 Mixed hyperlipidemia: Secondary | ICD-10-CM

## 2016-05-06 DIAGNOSIS — I10 Essential (primary) hypertension: Secondary | ICD-10-CM | POA: Diagnosis not present

## 2016-05-06 DIAGNOSIS — E876 Hypokalemia: Secondary | ICD-10-CM | POA: Diagnosis not present

## 2016-05-06 DIAGNOSIS — K219 Gastro-esophageal reflux disease without esophagitis: Secondary | ICD-10-CM

## 2016-05-06 DIAGNOSIS — F32A Depression, unspecified: Secondary | ICD-10-CM

## 2016-05-06 DIAGNOSIS — F329 Major depressive disorder, single episode, unspecified: Secondary | ICD-10-CM

## 2016-05-07 LAB — CBC AND DIFFERENTIAL
HCT: 32 % — AB (ref 36–46)
Hemoglobin: 10.1 g/dL — AB (ref 12.0–16.0)
PLATELETS: 205 10*3/uL (ref 150–399)
WBC: 7 10^3/mL

## 2016-05-07 LAB — BASIC METABOLIC PANEL
BUN: 13 mg/dL (ref 4–21)
CREATININE: 0.6 mg/dL (ref 0.5–1.1)
Glucose: 97 mg/dL
POTASSIUM: 3 mmol/L — AB (ref 3.4–5.3)
SODIUM: 145 mmol/L (ref 137–147)

## 2016-05-08 LAB — BASIC METABOLIC PANEL
BUN: 11 mg/dL (ref 4–21)
CREATININE: 0.6 mg/dL (ref 0.5–1.1)
GLUCOSE: 89 mg/dL
POTASSIUM: 3.2 mmol/L — AB (ref 3.4–5.3)
SODIUM: 144 mmol/L (ref 137–147)

## 2016-05-13 LAB — BASIC METABOLIC PANEL
BUN: 12 mg/dL (ref 4–21)
Creatinine: 0.6 mg/dL (ref 0.5–1.1)
Glucose: 102 mg/dL
Potassium: 3.1 mmol/L — AB (ref 3.4–5.3)
SODIUM: 143 mmol/L (ref 137–147)

## 2016-05-16 LAB — BASIC METABOLIC PANEL
BUN: 11 mg/dL (ref 4–21)
CREATININE: 0.7 mg/dL (ref 0.5–1.1)
Glucose: 113 mg/dL
POTASSIUM: 3.2 mmol/L — AB (ref 3.4–5.3)
Sodium: 145 mmol/L (ref 137–147)

## 2016-05-21 ENCOUNTER — Ambulatory Visit: Payer: Medicare Other | Admitting: Nurse Practitioner

## 2016-06-05 ENCOUNTER — Encounter: Payer: Self-pay | Admitting: Family

## 2016-06-05 ENCOUNTER — Non-Acute Institutional Stay (SKILLED_NURSING_FACILITY): Payer: Medicare Other | Admitting: Family

## 2016-06-05 DIAGNOSIS — F329 Major depressive disorder, single episode, unspecified: Secondary | ICD-10-CM | POA: Diagnosis not present

## 2016-06-05 DIAGNOSIS — E782 Mixed hyperlipidemia: Secondary | ICD-10-CM

## 2016-06-05 DIAGNOSIS — I10 Essential (primary) hypertension: Secondary | ICD-10-CM | POA: Diagnosis not present

## 2016-06-05 DIAGNOSIS — F32A Depression, unspecified: Secondary | ICD-10-CM

## 2016-06-05 DIAGNOSIS — E876 Hypokalemia: Secondary | ICD-10-CM | POA: Diagnosis not present

## 2016-06-05 NOTE — Progress Notes (Signed)
Location:  [facility] Room Number: [phone] Place of Service:  SNF (31)  Provider: Richarda Bladeinah Jakeline Dave FNP-C   PCP: Rodrigo RanPerini, Mark, MD Patient Care Team: Rodrigo RanPerini, Mark, MD as PCP - General (Internal Medicine)  Extended Emergency Contact Information Primary Emergency Contact: Terrall LaityFreeman,Joyce  [phone] [place] Home Phone: [phone] Relation: Daughter Secondary Emergency Contact: Tolentino,Christian  [place] Mobile Phone: [phone] Relation: Grandaughter  Code Status: DNR  Goals of care:  Advanced Directive information Advanced Directives 06/05/2016  Does Patient Have a Medical Advance Directive? Yes  Type of Estate agentAdvance Directive Healthcare Power of BryantAttorney;Out of facility DNR (pink MOST or yellow form)  Does patient want to make changes to medical advance directive? -  Copy of Healthcare Power of Attorney in Chart? No - copy requested  Pre-existing out of facility DNR order (yellow form or pink MOST form) Yellow form placed in chart (order not valid for inpatient use)     Allergies  Allergen Reactions  . Aspirin Other (See Comments)  . Zoledronic Acid Other (See Comments)    REACTION: Intolerance  . Allergen [Antipyrine-Benzocaine]     Chief Complaint  Patient presents with  . Medical Management of Chronic Issues    Routine visit    HPI:  83 y.o. female seen today at [facility] for medical management of chronic issues. He has a medical history of HTN, CAD,Afib, SAH, hyperlipidemia, Depression among other conditions. She is seen in her room today. She states no acute isseus this visit.she continues to work with therapy for muscle strengthening.Her weight has been stable. No recent fall episodes or hospital visit. No skin breakdown.     Past Medical History:  Diagnosis Date  . Aortic insufficiency Feb 2010   Mild to moderate per echo with normal EF  . Arthritis   . Atrial fibrillation  with rapid ventricular response [facility](HCC) Feb 2010  . CAD (coronary artery disease)    2010.  60% RCA, 60 - 70% LAD  . Carotid stenosis    40 - 59% bilateral  . Compression fracture   . Dementia   . Flash pulmonary edema [facility](HCC) Feb 2010   Due to atrial fib with RVR  . Hyperlipidemia   . Hypertension   . IBS (irritable bowel syndrome)   . PUD (peptic ulcer disease)   . Raynaud phenomenon   . Stroke (HCC)   . Vitamin D deficiency     Past Surgical History:  Procedure Laterality Date  . APPENDECTOMY    . CARDIAC CATHETERIZATION  03/11/2008   EF 45%  . TRANSTHORACIC ECHOCARDIOGRAM  03/06/2008   EF 55%      reports that she has never smoked. She has never used smokeless tobacco. She reports that she does not drink alcohol or use drugs. Social History   Social History  . Marital status: Married    Spouse name: N/A  . Number of children: N/A  . Years of education: N/A   Occupational History  . Not on file.   Social History Main Topics  . Smoking status: Never Smoker  . Smokeless tobacco: Never Used  . Alcohol use No  . Drug use: No  . Sexual activity: Not Currently   Other Topics Concern  . Not on file   Social History Narrative  . No narrative on file    Allergies  Allergen Reactions  . Aspirin Other (See Comments)  . Zoledronic Acid Other (See Comments)  REACTION: Intolerance  . Allergen [Antipyrine-Benzocaine]     Pertinent  Health Maintenance Due  Topic Date Due  . PNA vac Low Risk Adult (1 of 2 - PCV13) 06/05/2017 (Originally 10/30/1985)  . DEXA SCAN  06/06/2023 (Originally [birth year]30/1987)  . INFLUENZA VACCINE  08/28/2016    Medications: Allergies as of 06/05/2016      Reactions   Aspirin Other (See Comments)   Zoledronic Acid Other (See Comments)   REACTION: Intolerance   Allergen [antipyrine-benzocaine]       Medication List       Accurate as of 06/05/16 11:25 PM. Always use your most recent med list.          acetaminophen 325 MG tablet Commonly  known as:  TYLENOL Take 325 mg by mouth every 4 (four) hours as needed for moderate pain.   amiodarone 200 MG tablet Commonly known as:  PACERONE TAKE 1/2 TABLET BY MOUTH EVERY DAY   atorvastatin 20 MG tablet Commonly known as:  LIPITOR Take 20 mg by mouth daily at 6 PM.   citalopram 20 MG tablet Commonly known as:  CELEXA Take 20 mg by mouth daily.   denosumab 60 MG/ML Soln injection Commonly known as:  PROLIA Inject 60 mg into the skin every 6 (six) months. Administer in upper arm, thigh, or abdomen   DIOVAN 160 MG tablet Generic drug:  valsartan takes 160mg  by mouth once daily   magnesium hydroxide 400 MG/5ML suspension Commonly known as:  MILK OF MAGNESIA Take 20 mLs by mouth daily as needed for mild constipation.   OVER THE COUNTER MEDICATION Take 120 mLs by mouth 2 (two) times daily. Med pass   pantoprazole 40 MG tablet Commonly known as:  PROTONIX Take 1 tablet (40 mg total) by mouth daily.   polyethylene glycol packet Commonly known as:  MIRALAX / GLYCOLAX Take 17 g by mouth daily as needed for moderate constipation.   potassium chloride 10 MEQ tablet Commonly known as:  K-DUR Take 10 mEq by mouth daily.   sennosides-docusate sodium 8.6-50 MG tablet Commonly known as:  SENOKOT-S Take 1 tablet by mouth at bedtime as needed for constipation.   traZODone 50 MG tablet Commonly known as:  DESYREL Take 25 mg by mouth at bedtime.   Vitamin D (Ergocalciferol) 50000 units Caps capsule Commonly known as:  DRISDOL Take 50,000 Units by mouth every Wednesday.       Review of Systems  Constitutional: Negative for activity change, appetite change, chills, fatigue and fever.  HENT: Negative for congestion, rhinorrhea, sinus pain, sinus pressure, sneezing and sore throat.   Eyes: Negative.   Respiratory: Negative for cough, chest tightness, shortness of breath and wheezing.   Cardiovascular: Negative for chest pain, palpitations and leg swelling.    Gastrointestinal: Negative for abdominal distention, abdominal pain, constipation, diarrhea, nausea and vomiting.  Endocrine: Negative.   Genitourinary: Negative for dysuria, flank pain, frequency and urgency.  Musculoskeletal: Positive for gait problem.  Skin: Negative for color change, pallor and rash.  Neurological: Negative for dizziness, seizures, syncope, light-headedness and headaches.  Hematological: Does not bruise/bleed easily.  Psychiatric/Behavioral: Negative for agitation, confusion, hallucinations and sleep disturbance. The patient is not nervous/anxious.     Vitals:   06/05/16 0939  BP: (!) 175/81  Pulse: 66  Resp: 18  Temp: 99.1 F (37.3 C)  SpO2: 96%  Weight: 97 lb 3.2 oz (44.1 kg)  Height: 5\' 3"  (1.6 m)   Body mass index is 17.22 kg/m. Physical Exam  Constitutional:  She is oriented to person, place, and time. She appears well-developed and well-nourished. No distress.  HENT:  Head: Normocephalic.  Mouth/Throat: Oropharynx is clear and moist. No oropharyngeal exudate.  Eyes: Conjunctivae and EOM are normal. Pupils are equal, round, and reactive to light. Right eye exhibits no discharge. Left eye exhibits no discharge. No scleral icterus.  Neck: Normal range of motion. No JVD present. No thyromegaly present.  Cardiovascular: Intact distal pulses.  Exam reveals no gallop and no friction rub.   No murmur heard. HR irregular   Pulmonary/Chest: Effort normal and breath sounds normal. No respiratory distress. She has no wheezes. She has no rales.  Abdominal: Soft. Bowel sounds are normal. She exhibits no distension. There is no tenderness. There is no rebound and no guarding.  Musculoskeletal: She exhibits no edema, tenderness or deformity.  Moves x 4 extremities. Unsteady gait   Lymphadenopathy:    She has no cervical adenopathy.  Neurological: She is oriented to person, place, and time.  Skin: Skin is warm and dry. No rash noted. No erythema. No pallor.   Psychiatric: She has a normal mood and affect.    Labs reviewed: Basic Metabolic Panel:  Recent Labs  16/10/96 0724 03/21/16 0445 03/22/16 0534  05/08/16 05/13/16 05/16/16  NA 136 138 137  < > 144 143 145  K 2.9* 3.9 3.7  < > 3.2* 3.1* 3.2*  CL 98* 103 103  --   --   --   --   CO2 27 24 27   --   --   --   --   GLUCOSE 102* 132* 87  --   --   --   --   BUN 8 11 8   < > 11 12 11   CREATININE 0.50 0.58 0.49  < > 0.6 0.6 0.7  CALCIUM 8.1* 8.5* 8.7*  --   --   --   --   MG  --  1.8  --   --   --   --   --   < > = values in this interval not displayed. Liver Function Tests:  Recent Labs  06/07/15 1036 10/31/15 1406 03/19/16 2026  AST 13 16 19   ALT 12 12 15   ALKPHOS 61 61 114  BILITOT 0.4 0.4 0.9  PROT 6.9 6.9 6.7  ALBUMIN 3.9 3.9 2.9*   CBC:  Recent Labs  03/19/16 2026 03/20/16 0724 03/21/16 0445 04/03/16 05/07/16  WBC 10.3 10.5 7.4 5.2 7.0  NEUTROABS 7.8* 8.0* 5.3  --   --   HGB 10.9* 9.6* 10.0* 10.7* 10.1*  HCT 34.3* 30.1* 31.8* 34* 32*  MCV 92.5 92.6 94.1  --   --   PLT 232 228 240 275 205    Recent Labs  02/27/16 0843  GLUCAP 87   Assessment/Plan:     1. Essential hypertension SBP elevated this visit.asymptomatic. Facility log B/P stable. Will continue current Diovan and amiodarone. Continue to monitor B/P.   2. Mixed hyperlipidemia Continue on lipitor. Monitor lipid panel.   3. Depression Stable. Continues to work with therapy. Continue on Celexa. Monitor for mood changes.   4. Hypokalemia Previous K+ 3.2. Continue on Potassium chloride 10 meq tablet. Recheck BMP 06/06/2016    Future labs/tests needed:   BMP 06/06/2016

## 2016-06-06 LAB — BASIC METABOLIC PANEL
BUN: 13 mg/dL (ref 4–21)
Creatinine: 0.6 mg/dL (ref 0.5–1.1)
GLUCOSE: 98 mg/dL
Potassium: 3.7 mmol/L (ref 3.4–5.3)
Sodium: 143 mmol/L (ref 137–147)

## 2016-06-11 ENCOUNTER — Encounter: Payer: Self-pay | Admitting: *Deleted

## 2016-06-18 LAB — BASIC METABOLIC PANEL
BUN: 19 mg/dL (ref 4–21)
Creatinine: 0.7 mg/dL (ref 0.5–1.1)
Glucose: 94 mg/dL
Potassium: 4 mmol/L (ref 3.4–5.3)
Sodium: 140 mmol/L (ref 137–147)

## 2016-06-20 ENCOUNTER — Encounter: Payer: Self-pay | Admitting: *Deleted

## 2016-06-24 NOTE — Progress Notes (Signed)
Location:  [facility] Room Number: 408 Place of Service:  SNF [phone] Provider: Dylin Breeden FNP-C   Rodrigo RanPerini, Mark, MD  Patient Care Team: Rodrigo RanPerini, Mark, MD as PCP - General (Internal Medicine)  Extended Emergency Contact Information Primary Emergency Contact: [name]  [phone] [place] Home Phone: [phone] Relation: Daughter Secondary Emergency Contact: [name]  [place] Mobile Phone: [phone] Relation: Grandaughter  Code Status:  DNR  Goals of care: Advanced Directive information Advanced Directives 06/05/2016  Does Patient Have a Medical Advance Directive? Yes  Type of Estate agentAdvance Directive Healthcare Power of [place];Out of facility DNR (pink MOST or yellow form)  Does patient want to make changes to medical advance directive? -  Copy of Healthcare Power of Attorney in Chart? No - copy requested  Pre-existing out of facility DNR order (yellow form or pink MOST form) Yellow form placed in chart (order not valid for inpatient use)     Chief Complaint  Patient presents with  . Medical Management of Chronic Issues    Routine Visit    HPI:  Pt is a 83 y.o. female [facility] for medical management of chronic diseases.she has a medical history of HTN, hyperlipidemia, CAD, Afib, SAH, Anemia among other conditions. She is seen in her room today. She denies any acute issues this visit. Facility Nurse reports no recent fall episodes or weight changes since prior visit.       Past Medical History:  Diagnosis Date  . Aortic insufficiency Feb 2010   Mild to moderate per echo with normal EF  . Arthritis   . Atrial fibrillation with rapid ventricular response [facility](HCC) Feb 2010  . CAD (coronary artery disease)    2010.  60% RCA, 60 - 70% LAD  . Carotid stenosis    40 - 59% bilateral  . Compression fracture   . Dementia   . Flash pulmonary edema [facility](HCC) Feb 2010   Due to  atrial fib with RVR  . Hyperlipidemia   . Hypertension   . IBS (irritable bowel syndrome)   . PUD (peptic ulcer disease)   . Raynaud phenomenon   . Stroke (HCC)   . Vitamin D deficiency    Past Surgical History:  Procedure Laterality Date  . APPENDECTOMY    . CARDIAC CATHETERIZATION  03/11/2008   EF 45%  . TRANSTHORACIC ECHOCARDIOGRAM  03/06/2008   EF 55%    Allergies  Allergen Reactions  . Aspirin Other (See Comments)  . Zoledronic Acid Other (See Comments)    REACTION: Intolerance  . Allergen [Antipyrine-Benzocaine]     Allergies as of 05/06/2016      Reactions   Aspirin Other (See Comments)   Zoledronic Acid Other (See Comments)   REACTION: Intolerance      Medication List       Accurate as of 05/06/16 11:59 PM. Always use your most recent med list.          acetaminophen 325 MG tablet Commonly known as:  TYLENOL Take 325 mg by mouth every 4 (four) hours as needed for moderate pain.   amiodarone 200 MG tablet Commonly known as:  PACERONE TAKE 1/2 TABLET BY MOUTH EVERY DAY   atorvastatin 20 MG tablet Commonly known as:  LIPITOR Take 20 mg by mouth daily at 6 PM.   citalopram 20 MG tablet Commonly known as:  CELEXA Take 20 mg by mouth daily.   denosumab 60 MG/ML Soln injection Commonly  known as:  PROLIA Inject 60 mg into the skin every 6 (six) months. Administer in upper arm, thigh, or abdomen   DIOVAN 160 MG tablet Generic drug:  valsartan takes 160mg  by mouth once daily   magnesium hydroxide 400 MG/5ML suspension Commonly known as:  MILK OF MAGNESIA Take 20 mLs by mouth daily as needed for mild constipation.   OVER THE COUNTER MEDICATION Take 120 mLs by mouth 2 (two) times daily. Med pass   pantoprazole 40 MG tablet Commonly known as:  PROTONIX Take 1 tablet (40 mg total) by mouth daily.   polyethylene glycol packet Commonly known as:  MIRALAX / GLYCOLAX Take 17 g by mouth daily as needed for moderate constipation.   sennosides-docusate  sodium 8.6-50 MG tablet Commonly known as:  SENOKOT-S Take 1 tablet by mouth at bedtime as needed for constipation.   traZODone 50 MG tablet Commonly known as:  DESYREL Take 25 mg by mouth at bedtime.   Vitamin D (Ergocalciferol) 50000 units Caps capsule Commonly known as:  DRISDOL Take 50,000 Units by mouth every Wednesday.       Review of Systems  Constitutional: Negative for activity change, appetite change, fatigue and fever.  HENT: Negative for congestion, rhinorrhea, sinus pressure, sneezing and sore throat.   Eyes: Negative.   Respiratory: Negative for cough, chest tightness, shortness of breath and wheezing.   Cardiovascular: Negative for chest pain, palpitations and leg swelling.  Gastrointestinal: Negative for abdominal distention, abdominal pain, constipation, diarrhea, nausea and vomiting.  Endocrine: Negative.   Genitourinary: Negative for dysuria, flank pain, frequency and urgency.  Musculoskeletal: Positive for gait problem.  Skin: Negative for color change, pallor and rash.  Neurological: Negative for dizziness, seizures, syncope, light-headedness and headaches.  Hematological: Does not bruise/bleed easily.  Psychiatric/Behavioral: Negative for agitation, confusion, hallucinations and sleep disturbance. The patient is not nervous/anxious.     Immunization History  Administered Date(s) Administered  . PPD Test 03/25/2016, 03/28/2016, 04/08/2016   Pertinent  Health Maintenance Due  Topic Date Due  . PNA vac Low Risk Adult (1 of 2 - PCV13) 06/05/2017 (Originally 10/30/1987)  . DEXA SCAN  06/06/2023 (Originally 10/30/1987)  . INFLUENZA VACCINE  08/28/2016   Fall Risk  11/02/2014  Falls in the past year? No    Vitals:   05/06/16 1002  BP: (!) 148/63  Pulse: 77  Resp: 18  Temp: 97.6 F (36.4 C)  TempSrc: Oral  Weight: 97 lb 3.2 oz (44.1 kg)  Height: 5\' 3"  (1.6 m)   Body mass index is 17.22 kg/m. Physical Exam  Constitutional: She is oriented to  person, place, and time. She appears well-developed and well-nourished. No distress.  HENT:  Head: Normocephalic.  Mouth/Throat: Oropharynx is clear and moist. No oropharyngeal exudate.  Eyes: Conjunctivae and EOM are normal. Pupils are equal, round, and reactive to light. Right eye exhibits no discharge. Left eye exhibits no discharge. No scleral icterus.  Neck: Normal range of motion. No JVD present. No thyromegaly present.  Cardiovascular: Normal rate, regular rhythm, normal heart sounds and intact distal pulses.  Exam reveals no gallop and no friction rub.   No murmur heard. Pulmonary/Chest: Breath sounds normal. No respiratory distress. She has no wheezes. She has no rales.  Abdominal: Soft. Bowel sounds are normal. She exhibits no distension. There is no tenderness. There is no rebound and no guarding.  Musculoskeletal: She exhibits no edema or tenderness.  Moves x 4 extremities. Arthritic changes to fingers noted. kyphosis.   Lymphadenopathy:  She has no cervical adenopathy.  Neurological: She is oriented to person, place, and time.  Skin: Skin is warm and dry. No rash noted. No erythema. No pallor.  Psychiatric: She has a normal mood and affect.    Labs reviewed:  Recent Labs  03/20/16 0724 03/21/16 0445 03/22/16 0534  05/16/16 06/06/16 06/18/16  NA 136 138 137  < > 145 143 140  K 2.9* 3.9 3.7  < > 3.2* 3.7 4.0  CL 98* 103 103  --   --   --   --   CO2 27 24 27   --   --   --   --   GLUCOSE 102* 132* 87  --   --   --   --   BUN 8 11 8   < > 11 13 19   CREATININE 0.50 0.58 0.49  < > 0.7 0.6 0.7  CALCIUM 8.1* 8.5* 8.7*  --   --   --   --   MG  --  1.8  --   --   --   --   --   < > = values in this interval not displayed.  Recent Labs  10/31/15 1406 03/19/16 2026  AST 16 19  ALT 12 15  ALKPHOS 61 114  BILITOT 0.4 0.9  PROT 6.9 6.7  ALBUMIN 3.9 2.9*    Recent Labs  03/19/16 2026 03/20/16 0724 03/21/16 0445 04/03/16 05/07/16  WBC 10.3 10.5 7.4 5.2 7.0    NEUTROABS 7.8* 8.0* 5.3  --   --   HGB 10.9* 9.6* 10.0* 10.7* 10.1*  HCT 34.3* 30.1* 31.8* 34* 32*  MCV 92.5 92.6 94.1  --   --   PLT 232 228 240 275 205   Lab Results  Component Value Date   TSH 1.89 10/31/2015   Lab Results  Component Value Date   HGBA1C 5.9 (H) 06/17/2014   Lab Results  Component Value Date   CHOL 118 06/17/2014   HDL 37 (L) 06/17/2014   LDLCALC 62 06/17/2014   TRIG 96 06/17/2014   CHOLHDL 3.2 06/17/2014   Assessment/Plan 1. Essential hypertension B/p stable.continue on valsartan and amoidarone. Monitor BMP.On ASA for prophylaxis.   2. Mixed hyperlipidemia Continue on atorvastatin 20 mg tablet daily.monitor lipid panel periodically.   3. Depression  Stable.Continue on Celexa. Monitor for mood changes.  4. Gastroesophageal reflux disease without esophagitis Stable.Continue on Protonix.   5. Hypokalemia Continue on potassium chloride supplements. Monitor K+ level.   Family/ staff Communication: Reviewed plan of care with patient and facility Nurse supervisor   Labs/tests ordered: None   Caesar Bookman, NP

## 2017-03-07 ENCOUNTER — Encounter: Payer: Self-pay | Admitting: Podiatry

## 2017-03-07 ENCOUNTER — Ambulatory Visit (INDEPENDENT_AMBULATORY_CARE_PROVIDER_SITE_OTHER): Payer: Medicare Other | Admitting: Podiatry

## 2017-03-07 VITALS — BP 128/59 | HR 59 | Resp 16

## 2017-03-07 DIAGNOSIS — M79609 Pain in unspecified limb: Principal | ICD-10-CM

## 2017-03-07 DIAGNOSIS — M79676 Pain in unspecified toe(s): Secondary | ICD-10-CM | POA: Diagnosis not present

## 2017-03-07 DIAGNOSIS — B351 Tinea unguium: Secondary | ICD-10-CM

## 2017-03-07 NOTE — Progress Notes (Signed)
Subjective:  Patient ID: [name], female    DOB: 02/11/1920,  MRN: [id number]  Chief Complaint  Patient presents with  . Debridement    Requesting nail care-patient states she always misses the podiatrist when he comes to the home   84 y.o. female presents with the above complaint.  Reports painfully thickened elongated nails.  Past Medical History:  Diagnosis Date  . Aortic insufficiency Feb 2010   Mild to moderate per echo with normal EF  . Arthritis   . Atrial fibrillation with rapid ventricular response [facility](HCC) Feb 2010  . CAD (coronary artery disease)    2010.  60% RCA, 60 - 70% LAD  . Carotid stenosis    40 - 59% bilateral  . Compression fracture   . Dementia   . Flash pulmonary edema [facility](HCC) Feb 2010   Due to atrial fib with RVR  . Hyperlipidemia   . Hypertension   . IBS (irritable bowel syndrome)   . PUD (peptic ulcer disease)   . Raynaud phenomenon   . Stroke (HCC)   . Vitamin D deficiency    Past Surgical History:  Procedure Laterality Date  . APPENDECTOMY    . CARDIAC CATHETERIZATION  03/11/2008   EF 45%  . TRANSTHORACIC ECHOCARDIOGRAM  03/06/2008   EF 55%    Current Outpatient Medications:  .  acetaminophen (TYLENOL) 325 MG tablet, Take 325 mg by mouth every 4 (four) hours as needed for moderate pain., Disp: , Rfl:  .  amiodarone (PACERONE) 200 MG tablet, TAKE 1/2 TABLET BY MOUTH EVERY DAY, Disp: 30 tablet, Rfl: 6 .  atorvastatin (LIPITOR) 20 MG tablet, Take 20 mg by mouth daily at 6 PM. , Disp: , Rfl:  .  citalopram (CELEXA) 20 MG tablet, Take 20 mg by mouth daily. , Disp: , Rfl:  .  denosumab (PROLIA) 60 MG/ML SOLN injection, Inject 60 mg into the skin every 6 (six) months. Administer in upper arm, thigh, or abdomen, Disp: , Rfl:  .  magnesium hydroxide (MILK OF MAGNESIA) 400 MG/5ML suspension, Take 20 mLs by mouth daily as needed for mild constipation., Disp: , Rfl:  .  OVER THE COUNTER MEDICATION, Take 120 mLs by mouth 2 (two) times daily. Med pass, Disp:  , Rfl:  .  pantoprazole (PROTONIX) 40 MG tablet, Take 1 tablet (40 mg total) by mouth daily., Disp: , Rfl:  .  polyethylene glycol (MIRALAX / GLYCOLAX) packet, Take 17 g by mouth daily as needed for moderate constipation., Disp: 14 each, Rfl: 0 .  potassium chloride (K-DUR) 10 MEQ tablet, Take 10 mEq by mouth daily., Disp: , Rfl:  .  sennosides-docusate sodium (SENOKOT-S) 8.6-50 MG tablet, Take 1 tablet by mouth at bedtime as needed for constipation., Disp: 10 tablet, Rfl: 0 .  traZODone (DESYREL) 50 MG tablet, Take 25 mg by mouth at bedtime., Disp: , Rfl:  .  valsartan (DIOVAN) 160 MG tablet, takes 160mg  by mouth once daily, Disp: , Rfl:  .  Vitamin D, Ergocalciferol, (DRISDOL) 50000 UNITS CAPS, Take 50,000 Units by mouth every Wednesday. , Disp: , Rfl:   Allergies  Allergen Reactions  . Aspirin Other (See Comments)  . Zoledronic Acid Other (See Comments)    REACTION: Intolerance  . Allergen [Antipyrine-Benzocaine]    Review of Systems  Musculoskeletal: Positive for arthralgias and gait problem.  All other systems reviewed and are negative.  Objective:   Vitals:   03/07/17 0920  BP: (!) 128/59  Pulse: (!) 59  Resp: 16  General AA&O x3. Normal mood and affect.  Vascular Dorsalis pedis and posterior tibial pulses  present 2+ bilaterally  Capillary refill normal to all digits. Pedal hair growth normal.  Neurologic Epicritic sensation grossly present.  Dermatologic No open lesions. Interspaces clear of maceration. Nails x10 elongated thickened with crumbly texture subungual debris and pain to palpation  Orthopedic: MMT 5/5 in dorsiflexion, plantarflexion, inversion, and eversion. Normal joint ROM without pain or crepitus.    Assessment & Plan:  Patient was evaluated and treated and all questions answered.  Onychomycosis with pain -Nails palliatively debrided  Procedure: Nail Debridement Rationale: pain Type of Debridement: manual, sharp debridement. Instrumentation:  Nail nipper, rotary burr. Number of Nails: 10     No Follow-up on file.

## 2017-09-22 ENCOUNTER — Other Ambulatory Visit: Payer: Self-pay

## 2017-09-22 ENCOUNTER — Inpatient Hospital Stay (HOSPITAL_COMMUNITY): Payer: Medicare Other

## 2017-09-22 ENCOUNTER — Emergency Department (HOSPITAL_COMMUNITY): Payer: Medicare Other

## 2017-09-22 ENCOUNTER — Encounter (HOSPITAL_COMMUNITY): Payer: Self-pay | Admitting: Radiology

## 2017-09-22 ENCOUNTER — Inpatient Hospital Stay (HOSPITAL_COMMUNITY)
Admission: EM | Admit: 2017-09-22 | Discharge: 2017-09-24 | DRG: 065 | Disposition: A | Discharge status: Skilled Nursing Facility | Payer: Medicare Other | Attending: Internal Medicine | Admitting: Internal Medicine

## 2017-09-22 DIAGNOSIS — I10 Essential (primary) hypertension: Secondary | ICD-10-CM | POA: Diagnosis present

## 2017-09-22 DIAGNOSIS — G8194 Hemiplegia, unspecified affecting left nondominant side: Secondary | ICD-10-CM | POA: Diagnosis present

## 2017-09-22 DIAGNOSIS — R402362 Coma scale, best motor response, obeys commands, at arrival to emergency department: Secondary | ICD-10-CM | POA: Diagnosis present

## 2017-09-22 DIAGNOSIS — E785 Hyperlipidemia, unspecified: Secondary | ICD-10-CM | POA: Diagnosis present

## 2017-09-22 DIAGNOSIS — I480 Paroxysmal atrial fibrillation: Secondary | ICD-10-CM | POA: Diagnosis present

## 2017-09-22 DIAGNOSIS — K589 Irritable bowel syndrome without diarrhea: Secondary | ICD-10-CM | POA: Diagnosis present

## 2017-09-22 DIAGNOSIS — R402252 Coma scale, best verbal response, oriented, at arrival to emergency department: Secondary | ICD-10-CM | POA: Diagnosis present

## 2017-09-22 DIAGNOSIS — H9190 Unspecified hearing loss, unspecified ear: Secondary | ICD-10-CM | POA: Diagnosis present

## 2017-09-22 DIAGNOSIS — R402142 Coma scale, eyes open, spontaneous, at arrival to emergency department: Secondary | ICD-10-CM | POA: Diagnosis present

## 2017-09-22 DIAGNOSIS — I63411 Cerebral infarction due to embolism of right middle cerebral artery: Secondary | ICD-10-CM

## 2017-09-22 DIAGNOSIS — I25810 Atherosclerosis of coronary artery bypass graft(s) without angina pectoris: Secondary | ICD-10-CM | POA: Diagnosis present

## 2017-09-22 DIAGNOSIS — Z7401 Bed confinement status: Secondary | ICD-10-CM | POA: Diagnosis not present

## 2017-09-22 DIAGNOSIS — Z993 Dependence on wheelchair: Secondary | ICD-10-CM | POA: Diagnosis not present

## 2017-09-22 DIAGNOSIS — R29810 Facial weakness: Secondary | ICD-10-CM | POA: Diagnosis present

## 2017-09-22 DIAGNOSIS — I63311 Cerebral infarction due to thrombosis of right middle cerebral artery: Secondary | ICD-10-CM

## 2017-09-22 DIAGNOSIS — I639 Cerebral infarction, unspecified: Secondary | ICD-10-CM | POA: Diagnosis present

## 2017-09-22 DIAGNOSIS — F0390 Unspecified dementia without behavioral disturbance: Secondary | ICD-10-CM | POA: Diagnosis present

## 2017-09-22 DIAGNOSIS — R296 Repeated falls: Secondary | ICD-10-CM | POA: Diagnosis present

## 2017-09-22 DIAGNOSIS — I2510 Atherosclerotic heart disease of native coronary artery without angina pectoris: Secondary | ICD-10-CM | POA: Diagnosis present

## 2017-09-22 DIAGNOSIS — I481 Persistent atrial fibrillation: Secondary | ICD-10-CM | POA: Diagnosis present

## 2017-09-22 DIAGNOSIS — R29706 NIHSS score 6: Secondary | ICD-10-CM | POA: Diagnosis present

## 2017-09-22 DIAGNOSIS — Z823 Family history of stroke: Secondary | ICD-10-CM

## 2017-09-22 DIAGNOSIS — R627 Adult failure to thrive: Secondary | ICD-10-CM | POA: Diagnosis present

## 2017-09-22 DIAGNOSIS — I359 Nonrheumatic aortic valve disorder, unspecified: Secondary | ICD-10-CM | POA: Diagnosis present

## 2017-09-22 DIAGNOSIS — I351 Nonrheumatic aortic (valve) insufficiency: Secondary | ICD-10-CM | POA: Diagnosis not present

## 2017-09-22 DIAGNOSIS — R531 Weakness: Secondary | ICD-10-CM

## 2017-09-22 LAB — I-STAT CHEM 8, ED
BUN: 14 mg/dL (ref 8–23)
Calcium, Ion: 1.07 mmol/L — ABNORMAL LOW (ref 1.15–1.40)
Chloride: 101 mmol/L (ref 98–111)
Creatinine, Ser: 0.7 mg/dL (ref 0.44–1.00)
Glucose, Bld: 98 mg/dL (ref 70–99)
HEMATOCRIT: 31 % — AB (ref 36.0–46.0)
Hemoglobin: 10.5 g/dL — ABNORMAL LOW (ref 12.0–15.0)
Potassium: 4.2 mmol/L (ref 3.5–5.1)
SODIUM: 136 mmol/L (ref 135–145)
TCO2: 29 mmol/L (ref 22–32)

## 2017-09-22 LAB — COMPREHENSIVE METABOLIC PANEL
ALT: 10 U/L (ref 0–44)
AST: 17 U/L (ref 15–41)
Albumin: 3 g/dL — ABNORMAL LOW (ref 3.5–5.0)
Alkaline Phosphatase: 93 U/L (ref 38–126)
Anion gap: 8 (ref 5–15)
BUN: 11 mg/dL (ref 8–23)
CHLORIDE: 103 mmol/L (ref 98–111)
CO2: 28 mmol/L (ref 22–32)
Calcium: 8.8 mg/dL — ABNORMAL LOW (ref 8.9–10.3)
Creatinine, Ser: 0.68 mg/dL (ref 0.44–1.00)
GFR calc Af Amer: >60 mL/min (ref >60)
Glucose, Bld: 101 mg/dL — ABNORMAL HIGH (ref 70–99)
POTASSIUM: 4.3 mmol/L (ref 3.5–5.1)
Sodium: 139 mmol/L (ref 135–145)
Total Bilirubin: 0.7 mg/dL (ref 0.3–1.2)
Total Protein: 6.1 g/dL — ABNORMAL LOW (ref 6.5–8.1)

## 2017-09-22 LAB — I-STAT TROPONIN, ED: TROPONIN I, POC: 0.02 ng/mL (ref 0.00–0.08)

## 2017-09-22 LAB — DIFFERENTIAL
ABS IMMATURE GRANULOCYTES: 0.1 10*3/uL (ref 0.0–0.1)
BASOS PCT: 0 %
Basophils Absolute: 0 10*3/uL (ref 0.0–0.1)
Eosinophils Absolute: 0 10*3/uL (ref 0.0–0.7)
Eosinophils Relative: 0 %
Immature Granulocytes: 1 %
Lymphocytes Relative: 27 %
Lymphs Abs: 2.4 10*3/uL (ref 0.7–4.0)
MONO ABS: 0.7 10*3/uL (ref 0.1–1.0)
MONOS PCT: 8 %
NEUTROS PCT: 64 %
Neutro Abs: 5.7 10*3/uL (ref 1.7–7.7)

## 2017-09-22 LAB — CBC
HCT: 34.4 % — ABNORMAL LOW (ref 36.0–46.0)
Hemoglobin: 10.8 g/dL — ABNORMAL LOW (ref 12.0–15.0)
MCH: 29.3 pg (ref 26.0–34.0)
MCHC: 31.4 g/dL (ref 30.0–36.0)
MCV: 93.2 fL (ref 78.0–100.0)
Platelets: 210 10*3/uL (ref 150–400)
RBC: 3.69 MIL/uL — AB (ref 3.87–5.11)
RDW: 13.6 % (ref 11.5–15.5)
WBC: 9 10*3/uL (ref 4.0–10.5)

## 2017-09-22 LAB — LIPID PANEL
CHOLESTEROL: 149 mg/dL (ref 0–200)
HDL: 41 mg/dL (ref >40)
LDL CALC: 78 mg/dL (ref 0–99)
TRIGLYCERIDES: 148 mg/dL (ref <150)
Total CHOL/HDL Ratio: 3.6 RATIO
VLDL: 30 mg/dL (ref 0–40)

## 2017-09-22 LAB — PROTIME-INR
INR: 1.19
PROTHROMBIN TIME: 15 s (ref 11.4–15.2)

## 2017-09-22 LAB — HEMOGLOBIN A1C
HEMOGLOBIN A1C: 5.5 % (ref 4.8–5.6)
Mean Plasma Glucose: 111.15 mg/dL

## 2017-09-22 LAB — APTT: aPTT: 28 seconds (ref 24–36)

## 2017-09-22 LAB — CBG MONITORING, ED: Glucose-Capillary: 97 mg/dL (ref 70–99)

## 2017-09-22 MED ORDER — ACETAMINOPHEN 160 MG/5ML PO SOLN: 650.0000 mg | ORAL | Status: DC | PRN

## 2017-09-22 MED ORDER — ACETAMINOPHEN 650 MG RE SUPP: 650.0000 mg | RECTAL | Status: DC | PRN

## 2017-09-22 MED ORDER — IOPAMIDOL (ISOVUE-370) INJECTION 76%
50.0000 mL | Freq: Once | INTRAVENOUS | Status: AC | PRN
  Administered 2017-09-22: 50 mL via INTRAVENOUS

## 2017-09-22 MED ORDER — ACETAMINOPHEN 325 MG PO TABS: 650.0000 mg | ORAL_TABLET | ORAL | Status: DC | PRN

## 2017-09-22 MED ORDER — STROKE: EARLY STAGES OF RECOVERY BOOK
Freq: Once | Status: AC
  Administered 2017-09-23
  Filled 2017-09-22: qty 1

## 2017-09-22 MED ORDER — LORAZEPAM 2 MG/ML IJ SOLN
0.5000 mg | Freq: Once | INTRAMUSCULAR | Status: AC
  Administered 2017-09-22: 0.5 mg via INTRAVENOUS
  Filled 2017-09-22: qty 1

## 2017-09-22 MED ORDER — VITAMIN D (ERGOCALCIFEROL) 1.25 MG (50000 UNIT) PO CAPS
50000.0000 [IU] | ORAL_CAPSULE | ORAL | Status: DC
  Administered 2017-09-24: 50000 [IU] via ORAL
  Filled 2017-09-22: qty 1

## 2017-09-22 MED ORDER — SODIUM CHLORIDE 0.9 % IV SOLN
INTRAVENOUS | Status: DC
  Administered 2017-09-23: via INTRAVENOUS

## 2017-09-22 MED ORDER — SENNA-DOCUSATE SODIUM 8.6-50 MG PO TABS: 2.0000 | ORAL_TABLET | Freq: Every evening | ORAL | Status: DC | PRN

## 2017-09-22 MED ORDER — CITALOPRAM HYDROBROMIDE 10 MG PO TABS
20.0000 mg | ORAL_TABLET | Freq: Every day | ORAL | Status: DC
  Administered 2017-09-22 – 2017-09-24 (×3): 20 mg via ORAL
  Filled 2017-09-22 (×3): qty 2

## 2017-09-22 MED ORDER — PANTOPRAZOLE SODIUM 40 MG PO TBEC
40.0000 mg | DELAYED_RELEASE_TABLET | Freq: Every day | ORAL | Status: DC
  Administered 2017-09-22 – 2017-09-24 (×3): 40 mg via ORAL
  Filled 2017-09-22 (×3): qty 1

## 2017-09-22 MED ORDER — SENNOSIDES-DOCUSATE SODIUM 8.6-50 MG PO TABS: 2.0000 | ORAL_TABLET | Freq: Every evening | ORAL | Status: DC | PRN

## 2017-09-22 MED ORDER — TRAZODONE HCL 50 MG PO TABS
25.0000 mg | ORAL_TABLET | Freq: Every day | ORAL | Status: DC
  Administered 2017-09-23 (×2): 25 mg via ORAL
  Filled 2017-09-22 (×2): qty 1

## 2017-09-22 MED ORDER — AMIODARONE HCL 200 MG PO TABS
100.0000 mg | ORAL_TABLET | Freq: Every day | ORAL | Status: DC
  Administered 2017-09-22 – 2017-09-24 (×3): 100 mg via ORAL
  Filled 2017-09-22 (×3): qty 1

## 2017-09-22 MED ORDER — ENOXAPARIN SODIUM 30 MG/0.3ML ~~LOC~~ SOLN
30.0000 mg | SUBCUTANEOUS | Status: DC
  Administered 2017-09-23 (×2): 30 mg via SUBCUTANEOUS
  Filled 2017-09-22 (×2): qty 0.3

## 2017-09-22 MED ORDER — ASPIRIN 81 MG PO CHEW
81.0000 mg | CHEWABLE_TABLET | Freq: Every day | ORAL | Status: DC
  Administered 2017-09-23 – 2017-09-24 (×3): 81 mg via ORAL
  Filled 2017-09-22 (×3): qty 1

## 2017-09-22 NOTE — Progress Notes (Signed)
Patient transported to MRI. Not available for every 2 hour neurological check. [name]

## 2017-09-22 NOTE — ED Notes (Signed)
Pt transported upstairs with all belongings

## 2017-09-22 NOTE — H&P (Addendum)
History and Physical    [name] WGN:[id number] DOB: [birth year] DOA: 09/22/2017  PCP: Rodrigo Ran, MD Patient coming from: facility  Chief Complaint: left sided weakness  HPI: [name] is a delightful 84 y.o. female with medical history significant for Hypertension, hemorrhagic CVA, GI bleed, hyperlipidemia, carotid stenosis, A. Fib not on anticoagulation presents to the emergency Department chief complaint left-sided weakness. [facility] are asked to admit for stroke workup  Information is obtained from the patient and the chart and the daughter who is at the bedside. Daughter states she saw the patient 3 days ago and thought her "mouth was more drawn then usual". He states she told that nursing home staff who "dismissed it". 2 days ago a different family member went to the facility to visit and believed they noticed she wasn't moving her left arm as much as usual. Again the staff was notified. This morning patient complained of not being able to use her left hand and staff noted some slurred speech. EMS was called. Chart review indicates upon arrival patient was able to name objects all commands and talk with dysarthria. She denies headache dizziness syncope or near-syncope. She denies chest pain palpitations shortness of breath. No reports of any recent illness fever chills cough. Patient is mostly wheelchair bound. Family reports gradual decline in appetite with unintentional weight loss over the last year.    ED Course: in the emergency department she's afebrile hemodynamically stable and not hypoxic. She is provided with IV fluids. She passes her bedside swallow eval  Review of Systems: As per HPI otherwise all other systems reviewed and are negative.   Ambulatory Status: patient is bedbound and wheelchair-bound with difficulty bearing weight and is a Therapist, nutritional transfer. Assist with ADLs  Past Medical History:  Diagnosis Date  . Aortic insufficiency Feb 2010   Mild to  moderate per echo with normal EF  . Arthritis   . Atrial fibrillation with rapid ventricular response [facility]) Feb 2010  . CAD (coronary artery disease)    2010.  60% RCA, 60 - 70% LAD  . Carotid stenosis    40 - 59% bilateral  . Compression fracture   . Dementia   . Flash pulmonary edema [facility]) Feb 2010   Due to atrial fib with RVR  . Hyperlipidemia   . Hypertension   . IBS (irritable bowel syndrome)   . PUD (peptic ulcer disease)   . Raynaud phenomenon   . Stroke (HCC)   . Vitamin D deficiency     Past Surgical History:  Procedure Laterality Date  . APPENDECTOMY    . CARDIAC CATHETERIZATION  03/11/2008   EF 45%  . TRANSTHORACIC ECHOCARDIOGRAM  03/06/2008   EF 55%    Social History   Socioeconomic History  . Marital status: Married    Spouse name: Not on file  . Number of children: Not on file  . Years of education: Not on file  . Highest education level: Not on file  Occupational History  . Not on file  Social Needs  . Financial resource strain: Not on file  . Food insecurity:    Worry: Not on file    Inability: Not on file  . Transportation needs:    Medical: Not on file    Non-medical: Not on file  Tobacco Use  . Smoking status: Never Smoker  . Smokeless tobacco: Never Used  Substance and Sexual Activity  . Alcohol use: No  . Drug use: No  . Sexual  activity: Not Currently  Lifestyle  . Physical activity:    Days per week: Not on file    Minutes per session: Not on file  . Stress: Not on file  Relationships  . Social connections:    Talks on phone: Not on file    Gets together: Not on file    Attends religious service: Not on file    Active member of club or organization: Not on file    Attends meetings of clubs or organizations: Not on file    Relationship status: Not on file  . Intimate partner violence:    Fear of current or ex partner: Not on file    Emotionally abused: Not on file    Physically abused: Not on file    Forced sexual activity: Not  on file  Other Topics Concern  . Not on file  Social History Narrative  . Not on file    Allergies  Allergen Reactions  . Aspirin Other (See Comments)  . Zoledronic Acid Other (See Comments)    REACTION: Intolerance  . Allergen [Antipyrine-Benzocaine]     Family History  Problem Relation Age of Onset  . Stroke Mother   . Cancer Mother   . Heart attack Father     Prior to Admission medications   Medication Sig Start Date End Date Taking? Authorizing Provider  acetaminophen (TYLENOL) 325 MG tablet Take 325 mg by mouth every 4 (four) hours as needed for moderate pain.   Yes [provider]  amiodarone (PACERONE) 200 MG tablet TAKE 1/2 TABLET BY MOUTH EVERY DAY Patient taking differently: Take 100 mg by mouth daily.  06/02/13  Yes Rosalio Macadamia, NP  citalopram (CELEXA) 20 MG tablet Take 20 mg by mouth daily.    Yes [provider]  losartan (COZAAR) 100 MG tablet Take 100 mg by mouth daily. 09/17/17  Yes [provider]  pantoprazole (PROTONIX) 40 MG tablet Take 1 tablet (40 mg total) by mouth daily. 07/06/14  Yes Love, Evlyn Kanner, PA-C  sennosides-docusate sodium (SENOKOT-S) 8.6-50 MG tablet Take 1 tablet by mouth at bedtime as needed for constipation. Patient taking differently: Take 2 tablets by mouth at bedtime as needed for constipation.  03/24/16  Yes Rolly Salter, MD  traZODone (DESYREL) 50 MG tablet Take 25 mg by mouth at bedtime.   Yes [provider]  Vitamin D, Ergocalciferol, (DRISDOL) 50000 UNITS CAPS Take 50,000 Units by mouth every Wednesday.    Yes [provider]    Physical Exam: Vitals:   09/22/17 1230 09/22/17 1234 09/22/17 1239 09/22/17 1300  BP: (!) 158/54   (!) 166/62  Pulse: (!) 57   63  Resp: 14   15  Temp:  98.3 F (36.8 C) 98.3 F (36.8 C)   TempSrc:   Tympanic   SpO2: 99%   96%  Weight:         General:  Appears calm, thin frail pale in no acute distress Eyes:  PERRL, EOMI, normal lids, iris ENT:   grossly normal hearing, lips & tongue, mucous membranes of her mouth are pink slightly dry Neck:  no LAD, masses or thyromegaly Cardiovascular:  Irregularly irregular, no m/r/g. No LE edema.  Respiratory:  CTA bilaterally, no w/r/r. Normal respiratory effort. Abdomen:  soft, ntnd, positive bowel sounds throughout no guarding or rebounding Skin:  no rash or induration seen on limited exam Musculoskeletal:  grossly normal tone BUE/BLE, good ROM, no bony abnormality Psychiatric:  Calm, cooperative, follows  commands Neurologic:  Alert oriented to self and place. Speech is low but clear left-sided facial droop left grip 2 out of 5 right grip 3 out of 5 left lower extremity strength is 2 out of 5 right lower extremity strength 3 out of 5.  Labs on Admission: I have personally reviewed following labs and imaging studies  CBC: Recent Labs  Lab 09/22/17 0958 09/22/17 1001  WBC 9.0  --   NEUTROABS 5.7  --   HGB 10.8* 10.5*  HCT 34.4* 31.0*  MCV 93.2  --   PLT 210  --    Basic Metabolic Panel: Recent Labs  Lab 09/22/17 0958 09/22/17 1001  NA 139 136  K 4.3 4.2  CL 103 101  CO2 28  --   GLUCOSE 101* 98  BUN 11 14  CREATININE 0.68 0.70  CALCIUM 8.8*  --    GFR: CrCl cannot be calculated (Unknown ideal weight.). Liver Function Tests: Recent Labs  Lab 09/22/17 0958  AST 17  ALT 10  ALKPHOS 93  BILITOT 0.7  PROT 6.1*  ALBUMIN 3.0*   No results for input(s): LIPASE, AMYLASE in the last 168 hours. No results for input(s): AMMONIA in the last 168 hours. Coagulation Profile: Recent Labs  Lab 09/22/17 0958  INR 1.19   Cardiac Enzymes: No results for input(s): CKTOTAL, CKMB, CKMBINDEX, TROPONINI in the last 168 hours. BNP (last 3 results) No results for input(s): PROBNP in the last 8760 hours. HbA1C: No results for input(s): HGBA1C in the last 72 hours. CBG: Recent Labs  Lab 09/22/17 0957  GLUCAP 97   Lipid Profile: No results for input(s): CHOL, HDL, LDLCALC, TRIG,  CHOLHDL, LDLDIRECT in the last 72 hours. Thyroid Function Tests: No results for input(s): TSH, T4TOTAL, FREET4, T3FREE, THYROIDAB in the last 72 hours. Anemia Panel: No results for input(s): VITAMINB12, FOLATE, FERRITIN, TIBC, IRON, RETICCTPCT in the last 72 hours. Urine analysis:    Component Value Date/Time   COLORURINE YELLOW 03/20/2016 0021   APPEARANCEUR HAZY (A) 03/20/2016 0021   LABSPEC 1.015 03/20/2016 0021   PHURINE 7.0 03/20/2016 0021   GLUCOSEU NEGATIVE 03/20/2016 0021   HGBUR NEGATIVE 03/20/2016 0021   BILIRUBINUR NEGATIVE 03/20/2016 0021   KETONESUR NEGATIVE 03/20/2016 0021   PROTEINUR 30 (A) 03/20/2016 0021   UROBILINOGEN 0.2 06/16/2014 2000   NITRITE NEGATIVE 03/20/2016 0021   LEUKOCYTESUR SMALL (A) 03/20/2016 0021    Creatinine Clearance: CrCl cannot be calculated (Unknown ideal weight.).  Sepsis Labs: @LABRCNTIP (procalcitonin:4,lacticidven:4) )No results found for this or any previous visit (from the past 240 hour(s)).   Radiological Exams on Admission: Ct Angio Head W Or Wo Contrast  Result Date: 09/22/2017 CLINICAL DATA:  Focal neuro deficit greater than 6 hours. Left-sided weakness. EXAM: CT ANGIOGRAPHY HEAD AND NECK TECHNIQUE: Multidetector CT imaging of the head and neck was performed using the standard protocol during bolus administration of intravenous contrast. Multiplanar CT image reconstructions and MIPs were obtained to evaluate the vascular anatomy. Carotid stenosis measurements (when applicable) are obtained utilizing NASCET criteria, using the distal internal carotid diameter as the denominator. CONTRAST:  50mL ISOVUE-370 IOPAMIDOL (ISOVUE-370) INJECTION 76% COMPARISON:  CT head 09/22/2017 FINDINGS: CTA NECK FINDINGS Aortic arch: Moderate atherosclerotic disease aortic arch. Atherosclerotic disease proximal great vessels. Mild narrowing of the proximal subclavian artery bilaterally. Right carotid system: Tortuous common carotid artery widely patent.  Atherosclerotic calcification proximal right internal carotid artery, 25% diameter stenosis Left carotid system: Tortuous left common carotid artery widely patent. Atherosclerotic calcification proximal left internal  carotid artery without significant stenosis. Vertebral arteries: Right vertebral dominant. Both vertebral arteries patent to the basilar without significant stenosis. Skeleton: Kyphoscoliosis in the cervical and thoracic spine. Multilevel degenerative changes in the cervical spine. Other neck: Negative for mass or adenopathy Upper chest: Calcified pleural plaques in the apices bilaterally with apical scarring bilaterally. No acute abnormality. Atherosclerotic aortic arch. Review of the MIP images confirms the above findings CTA HEAD FINDINGS Anterior circulation: Mild atherosclerotic disease in the cavernous carotid bilaterally without stenosis. Atherosclerotic calcification in the M1 and M2 segments bilaterally without significant stenosis. Anterior cerebral arteries patent bilaterally. Posterior circulation: Both vertebral arteries patent to the basilar. Basilar widely patent. PICA patent bilaterally. AICA, superior cerebellar, posterior cerebral arteries patent bilaterally. Mild atherosclerotic disease in the posterior cerebral arteries bilaterally with calcific atherosclerotic disease. Venous sinuses: Not well evaluated due to timing of injection. Anatomic variants: None Delayed phase: Not perform Review of the MIP images confirms the above findings IMPRESSION: No significant carotid or vertebral artery stenosis in the neck. Mild atherosclerotic disease in the middle cerebral arteries bilaterally and mild atherosclerotic disease in the posterior cerebral arteries bilaterally. Negative for emergent large vessel occlusion. These results were called by telephone at the time of interpretation on 09/22/2017 at 10:49 am to Dr. Arther Dames , who verbally acknowledged these results. Electronically Signed    By: Marlan Palau M.D.   On: 09/22/2017 10:49   Ct Angio Neck W Or Wo Contrast  Result Date: 09/22/2017 CLINICAL DATA:  Focal neuro deficit greater than 6 hours. Left-sided weakness. EXAM: CT ANGIOGRAPHY HEAD AND NECK TECHNIQUE: Multidetector CT imaging of the head and neck was performed using the standard protocol during bolus administration of intravenous contrast. Multiplanar CT image reconstructions and MIPs were obtained to evaluate the vascular anatomy. Carotid stenosis measurements (when applicable) are obtained utilizing NASCET criteria, using the distal internal carotid diameter as the denominator. CONTRAST:  50mL ISOVUE-370 IOPAMIDOL (ISOVUE-370) INJECTION 76% COMPARISON:  CT head 09/22/2017 FINDINGS: CTA NECK FINDINGS Aortic arch: Moderate atherosclerotic disease aortic arch. Atherosclerotic disease proximal great vessels. Mild narrowing of the proximal subclavian artery bilaterally. Right carotid system: Tortuous common carotid artery widely patent. Atherosclerotic calcification proximal right internal carotid artery, 25% diameter stenosis Left carotid system: Tortuous left common carotid artery widely patent. Atherosclerotic calcification proximal left internal carotid artery without significant stenosis. Vertebral arteries: Right vertebral dominant. Both vertebral arteries patent to the basilar without significant stenosis. Skeleton: Kyphoscoliosis in the cervical and thoracic spine. Multilevel degenerative changes in the cervical spine. Other neck: Negative for mass or adenopathy Upper chest: Calcified pleural plaques in the apices bilaterally with apical scarring bilaterally. No acute abnormality. Atherosclerotic aortic arch. Review of the MIP images confirms the above findings CTA HEAD FINDINGS Anterior circulation: Mild atherosclerotic disease in the cavernous carotid bilaterally without stenosis. Atherosclerotic calcification in the M1 and M2 segments bilaterally without significant  stenosis. Anterior cerebral arteries patent bilaterally. Posterior circulation: Both vertebral arteries patent to the basilar. Basilar widely patent. PICA patent bilaterally. AICA, superior cerebellar, posterior cerebral arteries patent bilaterally. Mild atherosclerotic disease in the posterior cerebral arteries bilaterally with calcific atherosclerotic disease. Venous sinuses: Not well evaluated due to timing of injection. Anatomic variants: None Delayed phase: Not perform Review of the MIP images confirms the above findings IMPRESSION: No significant carotid or vertebral artery stenosis in the neck. Mild atherosclerotic disease in the middle cerebral arteries bilaterally and mild atherosclerotic disease in the posterior cerebral arteries bilaterally. Negative for emergent large vessel occlusion. These results were  called by telephone at the time of interpretation on 09/22/2017 at 10:49 am to Dr. Arther DamesSUSHANTH AROOR , who verbally acknowledged these results. Electronically Signed   By: Marlan Palauharles  Clark M.D.   On: 09/22/2017 10:49   Ct Head Code Stroke Wo Contrast  Result Date: 09/22/2017 CLINICAL DATA:  Code stroke.  Left-sided weakness. EXAM: CT HEAD WITHOUT CONTRAST TECHNIQUE: Contiguous axial images were obtained from the base of the skull through the vertex without intravenous contrast. COMPARISON:  CT head 09/13/2014 FINDINGS: Brain: Advanced atrophy. Moderate to advanced chronic microvascular ischemic changes in the white matter. Progressive atrophy since 2016. Negative for acute infarct, hemorrhage, or mass lesion. Vascular: Atherosclerotic calcification extends into the middle cerebral arteries bilaterally. Negative for hyperdense vessel Skull: Negative Sinuses/Orbits: Mild mucosal edema paranasal sinuses. Bilateral cataract surgery Other: None ASPECTS ([place] Stroke Program Early CT Score) - Ganglionic level infarction (caudate, lentiform nuclei, internal capsule, insula, M1-M3 cortex): 7 - Supraganglionic  infarction (M4-M6 cortex): 3 Total score (0-10 with 10 being normal): 10 IMPRESSION: 1. No acute infarct identified. Extensive atrophy. Moderate chronic microvascular ischemia in the white matter 2. ASPECTS is 10 Electronically Signed   By: Marlan Palauharles  Clark M.D.   On: 09/22/2017 10:16    EKG: Independently reviewed. Minimal STdepression,anterolateral leads sinus arrythmia.  Assessment/Plan Principal Problem:   Stroke [facility](HCC) Active Problems:   PAF (paroxysmal atrial fibrillation) (HCC)   Aortic valve disease   CAD (coronary artery disease) of artery bypass graft   Essential hypertension   Hyperlipidemia    #1. Left-sided weakness/stroke. Patient with a history of A. Fib not on anticoagulation secondary to a history of hemorrhagic stroke and GI bleed. Outside the window for TPA. CTA no significant carotid or vertebral artery stenosis, negative for emergent large vessel occlusion. Evaluated by neurology who opine likely right internal capsule/basal ganglia infarct. Neuro also indicates that due to her hemorrhagic intracranial bleed in the past and GI bleed in the past no anticoagulation -admit to telemetry -MRI of the brain -Echo -Statin -Physical therapy -Occupational therapy -neuro recommends 81 mg of aspirin that patient has allergy  #2. Paroxysmal atrial fibrillation. Not on anticoagulation. Rate controlled.  EKG as noted above. [place] 7. Home medications include amiodarone -Continue home meds -Neuro recommends aspirin but patient has allergy -No anticoagulation secondary to history of hemorrhagic stroke and history of GI bleed  #3. Hypertension. Fair control in the emergency department. Home medications include losartan -Holding losartan for now -Monitor  #4. CAD. No chest pain. EKG as noted above. -Obtain lipid panel -chart review indicates patient was on statin in the past    DVT prophylaxis: lovenox  Code Status: limited  Family Communication: daughter at bedside    Disposition Plan: back to facility  Consults called: [facility] neuro  Admission status: inpatient   Gwenyth BenderBLACK,Anneke Cundy M MD [facility]  If 7PM-7AM, please contact night-coverage [email or web address] Password [email or web address]  09/22/2017, 2:44 PM

## 2017-09-22 NOTE — ED Notes (Signed)
4 mg narcan given at this time.

## 2017-09-22 NOTE — ED Provider Notes (Signed)
[facility] EMERGENCY DEPARTMENT Provider Note   CSN: [id number] Arrival date & time: 09/22/17  4098     History   Chief Complaint No chief complaint on file.   HPI [name] is a 84 y.o. female.  The history is provided by the patient and medical records. No language interpreter was used.  Neurologic Problem  This is a new problem. The problem occurs constantly. The problem has not changed since onset.Pertinent negatives include no chest pain, no abdominal pain, no headaches and no shortness of breath. Nothing aggravates the symptoms. Nothing relieves the symptoms. She has tried nothing for the symptoms. The treatment provided no relief.    Past Medical History:  Diagnosis Date  . Aortic insufficiency Feb 2010   Mild to moderate per echo with normal EF  . Arthritis   . Atrial fibrillation with rapid ventricular response [facility]) Feb 2010  . CAD (coronary artery disease)    2010.  60% RCA, 60 - 70% LAD  . Carotid stenosis    40 - 59% bilateral  . Compression fracture   . Dementia   . Flash pulmonary edema [facility]) Feb 2010   Due to atrial fib with RVR  . Hyperlipidemia   . Hypertension   . IBS (irritable bowel syndrome)   . PUD (peptic ulcer disease)   . Raynaud phenomenon   . Stroke (HCC)   . Vitamin D deficiency     Patient Active Problem List   Diagnosis Date Noted  . Palliative care encounter   . Acute lower UTI   . Protein-calorie malnutrition, severe 03/21/2016  . SIRS (systemic inflammatory response syndrome) (HCC) 03/19/2016  . UTI (urinary tract infection) 03/19/2016  . Pain in joint, shoulder region 11/02/2014  . Anemia due to chronic blood loss 11/02/2014  . Nontraumatic subcortical hemorrhage of right cerebral hemisphere (HCC) 11/02/2014  . Nontraumatic subcortical hemorrhage of cerebral hemisphere (HCC) 08/23/2014  . SAH (subarachnoid hemorrhage) (HCC) 08/23/2014  . Paroxysmal atrial fibrillation (HCC) 08/23/2014  . Essential  hypertension 08/23/2014  . Hyperlipidemia 08/23/2014  . Mild TBI (HCC) 06/21/2014  . Traumatic brain injury (HCC)   . SDH (subdural hematoma) (HCC) 06/16/2014  . Nontraumatic hemorrhage of cerebral hemisphere (HCC)   . CAD (coronary artery disease) of artery bypass graft 06/15/2012  . Carotid bruit present 06/15/2012  . Raynaud phenomenon 02/08/2011  . PAF (paroxysmal atrial fibrillation) (HCC) 10/09/2010  . High risk medication use 10/09/2010  . Aortic valve disease 10/09/2010  . HYPERTENSION 06/10/2007  . ALLERGIC RHINITIS 06/10/2007  . EMPHYSEMA 06/10/2007  . DYSPNEA 06/10/2007  . COUGH 06/10/2007    Past Surgical History:  Procedure Laterality Date  . APPENDECTOMY    . CARDIAC CATHETERIZATION  03/11/2008   EF 45%  . TRANSTHORACIC ECHOCARDIOGRAM  03/06/2008   EF 55%     OB History   None      Home Medications    Prior to Admission medications   Medication Sig Start Date End Date Taking? Authorizing Provider  acetaminophen (TYLENOL) 325 MG tablet Take 325 mg by mouth every 4 (four) hours as needed for moderate pain.    [provider]  amiodarone (PACERONE) 200 MG tablet TAKE 1/2 TABLET BY MOUTH EVERY DAY 06/02/13   Rosalio Macadamia, NP  atorvastatin (LIPITOR) 20 MG tablet Take 20 mg by mouth daily at 6 PM.     [provider]  citalopram (CELEXA) 20 MG tablet Take 20 mg by mouth daily.  [provider]  denosumab (PROLIA) 60 MG/ML SOLN injection Inject 60 mg into the skin every 6 (six) months. Administer in upper arm, thigh, or abdomen    [provider]  magnesium hydroxide (MILK OF MAGNESIA) 400 MG/5ML suspension Take 20 mLs by mouth daily as needed for mild constipation.    [provider]  OVER THE COUNTER MEDICATION Take 120 mLs by mouth 2 (two) times daily. Med pass    [provider]  pantoprazole (PROTONIX) 40 MG tablet Take 1 tablet (40 mg total) by mouth daily. 07/06/14   Love, Evlyn Kanner, PA-C  polyethylene  glycol (MIRALAX / GLYCOLAX) packet Take 17 g by mouth daily as needed for moderate constipation. 03/24/16   Rolly Salter, MD  potassium chloride (K-DUR) 10 MEQ tablet Take 10 mEq by mouth daily.    [provider]  sennosides-docusate sodium (SENOKOT-S) 8.6-50 MG tablet Take 1 tablet by mouth at bedtime as needed for constipation. 03/24/16   Rolly Salter, MD  traZODone (DESYREL) 50 MG tablet Take 25 mg by mouth at bedtime.    [provider]  valsartan (DIOVAN) 160 MG tablet takes 160mg  by mouth once daily 09/19/11   Rollene Rotunda, MD  Vitamin D, Ergocalciferol, (DRISDOL) 50000 UNITS CAPS Take 50,000 Units by mouth every Wednesday.     [provider]    Family History Family History  Problem Relation Age of Onset  . Stroke Mother   . Cancer Mother   . Heart attack Father     Social History Social History   Tobacco Use  . Smoking status: Never Smoker  . Smokeless tobacco: Never Used  Substance Use Topics  . Alcohol use: No  . Drug use: No     Allergies   Aspirin; Zoledronic acid; and Allergen [antipyrine-benzocaine]   Review of Systems Review of Systems  Constitutional: Negative for chills, diaphoresis, fatigue and fever.  HENT: Negative for congestion.   Eyes: Negative for photophobia and visual disturbance.  Respiratory: Negative for shortness of breath.   Cardiovascular: Negative for chest pain.  Gastrointestinal: Negative for abdominal pain, constipation, diarrhea, nausea and rectal pain.  Genitourinary: Negative for dysuria, flank pain and frequency.  Musculoskeletal: Negative for back pain, neck pain and neck stiffness.  Skin: Negative for rash and wound.  Neurological: Positive for facial asymmetry and weakness. Negative for dizziness, seizures, speech difficulty, light-headedness, numbness and headaches.  Psychiatric/Behavioral: Negative for agitation and confusion.  All other systems reviewed and are negative.    Physical  Exam Updated Vital Signs There were no vitals taken for this visit.  Physical Exam  Constitutional: She is oriented to person, place, and time. She appears well-developed and well-nourished. No distress.  HENT:  Head: Atraumatic.  Mouth/Throat: Oropharynx is clear and moist. No oropharyngeal exudate.  Eyes: Pupils are equal, round, and reactive to light. Conjunctivae and EOM are normal.  Neck: Normal range of motion. Neck supple.  Cardiovascular: Normal rate and regular rhythm.  No murmur heard. Pulmonary/Chest: Effort normal and breath sounds normal. No respiratory distress. She has no wheezes. She has no rales. She exhibits no tenderness.  Abdominal: Soft. There is no tenderness.  Musculoskeletal: She exhibits no edema or tenderness.  Neurological: She is alert and oriented to person, place, and time. She is not disoriented. She displays normal reflexes. A cranial nerve deficit is present. No sensory deficit. She exhibits abnormal muscle tone. Coordination normal. GCS eye subscore is 4. GCS verbal subscore is 5. GCS motor subscore  is 6.  Left face, left arm, left leg weakness.  Normal sensation throughout.  Clear speech.  Normal extra ocular movements and pupils are symmetric and reactive bilaterally.  Patient appears well otherwise.  Skin: Skin is warm and dry. Capillary refill takes less than 2 seconds. No rash noted. She is not diaphoretic. No erythema.  Psychiatric: She has a normal mood and affect.  Nursing note and vitals reviewed.    ED Treatments / Results  Labs (all labs ordered are listed, but only abnormal results are displayed) Labs Reviewed  CBC - Abnormal; Notable for the following components:      Result Value   RBC 3.69 (*)    Hemoglobin 10.8 (*)    HCT 34.4 (*)    All other components within normal limits  COMPREHENSIVE METABOLIC PANEL - Abnormal; Notable for the following components:   Glucose, Bld 101 (*)    Calcium 8.8 (*)    Total Protein 6.1 (*)     Albumin 3.0 (*)    All other components within normal limits  I-STAT CHEM 8, ED - Abnormal; Notable for the following components:   Calcium, Ion 1.07 (*)    Hemoglobin 10.5 (*)    HCT 31.0 (*)    All other components within normal limits  PROTIME-INR  APTT  DIFFERENTIAL  HEMOGLOBIN A1C  LIPID PANEL  I-STAT TROPONIN, ED  CBG MONITORING, ED    EKG EKG Interpretation  Date/Time:  Monday September 22 2017 10:31:04 EDT Ventricular Rate:  69 PR Interval:    QRS Duration: 80 QT Interval:  437 QTC Calculation: 469 R Axis:   32 Text Interpretation:  Minimal ST depression, anterolateral leads sinus arrythmia.  When comapred to prior,  no significant changes2 seen.  No STEMI Confirmed by Theda Belfast ([id number]) on 09/22/2017 11:20:37 AM   Radiology Ct Angio Head W Or Wo Contrast  Result Date: 09/22/2017 CLINICAL DATA:  Focal neuro deficit greater than 6 hours. Left-sided weakness. EXAM: CT ANGIOGRAPHY HEAD AND NECK TECHNIQUE: Multidetector CT imaging of the head and neck was performed using the standard protocol during bolus administration of intravenous contrast. Multiplanar CT image reconstructions and MIPs were obtained to evaluate the vascular anatomy. Carotid stenosis measurements (when applicable) are obtained utilizing NASCET criteria, using the distal internal carotid diameter as the denominator. CONTRAST:  50mL ISOVUE-370 IOPAMIDOL (ISOVUE-370) INJECTION 76% COMPARISON:  CT head 09/22/2017 FINDINGS: CTA NECK FINDINGS Aortic arch: Moderate atherosclerotic disease aortic arch. Atherosclerotic disease proximal great vessels. Mild narrowing of the proximal subclavian artery bilaterally. Right carotid system: Tortuous common carotid artery widely patent. Atherosclerotic calcification proximal right internal carotid artery, 25% diameter stenosis Left carotid system: Tortuous left common carotid artery widely patent. Atherosclerotic calcification proximal left internal carotid artery without  significant stenosis. Vertebral arteries: Right vertebral dominant. Both vertebral arteries patent to the basilar without significant stenosis. Skeleton: Kyphoscoliosis in the cervical and thoracic spine. Multilevel degenerative changes in the cervical spine. Other neck: Negative for mass or adenopathy Upper chest: Calcified pleural plaques in the apices bilaterally with apical scarring bilaterally. No acute abnormality. Atherosclerotic aortic arch. Review of the MIP images confirms the above findings CTA HEAD FINDINGS Anterior circulation: Mild atherosclerotic disease in the cavernous carotid bilaterally without stenosis. Atherosclerotic calcification in the M1 and M2 segments bilaterally without significant stenosis. Anterior cerebral arteries patent bilaterally. Posterior circulation: Both vertebral arteries patent to the basilar. Basilar widely patent. PICA patent bilaterally. AICA, superior cerebellar, posterior cerebral arteries patent bilaterally. Mild atherosclerotic disease in the posterior  cerebral arteries bilaterally with calcific atherosclerotic disease. Venous sinuses: Not well evaluated due to timing of injection. Anatomic variants: None Delayed phase: Not perform Review of the MIP images confirms the above findings IMPRESSION: No significant carotid or vertebral artery stenosis in the neck. Mild atherosclerotic disease in the middle cerebral arteries bilaterally and mild atherosclerotic disease in the posterior cerebral arteries bilaterally. Negative for emergent large vessel occlusion. These results were called by telephone at the time of interpretation on 09/22/2017 at 10:49 am to Dr. Arther Dames , who verbally acknowledged these results. Electronically Signed   By: Marlan Palau M.D.   On: 09/22/2017 10:49   Ct Angio Neck W Or Wo Contrast  Result Date: 09/22/2017 CLINICAL DATA:  Focal neuro deficit greater than 6 hours. Left-sided weakness. EXAM: CT ANGIOGRAPHY HEAD AND NECK TECHNIQUE:  Multidetector CT imaging of the head and neck was performed using the standard protocol during bolus administration of intravenous contrast. Multiplanar CT image reconstructions and MIPs were obtained to evaluate the vascular anatomy. Carotid stenosis measurements (when applicable) are obtained utilizing NASCET criteria, using the distal internal carotid diameter as the denominator. CONTRAST:  50mL ISOVUE-370 IOPAMIDOL (ISOVUE-370) INJECTION 76% COMPARISON:  CT head 09/22/2017 FINDINGS: CTA NECK FINDINGS Aortic arch: Moderate atherosclerotic disease aortic arch. Atherosclerotic disease proximal great vessels. Mild narrowing of the proximal subclavian artery bilaterally. Right carotid system: Tortuous common carotid artery widely patent. Atherosclerotic calcification proximal right internal carotid artery, 25% diameter stenosis Left carotid system: Tortuous left common carotid artery widely patent. Atherosclerotic calcification proximal left internal carotid artery without significant stenosis. Vertebral arteries: Right vertebral dominant. Both vertebral arteries patent to the basilar without significant stenosis. Skeleton: Kyphoscoliosis in the cervical and thoracic spine. Multilevel degenerative changes in the cervical spine. Other neck: Negative for mass or adenopathy Upper chest: Calcified pleural plaques in the apices bilaterally with apical scarring bilaterally. No acute abnormality. Atherosclerotic aortic arch. Review of the MIP images confirms the above findings CTA HEAD FINDINGS Anterior circulation: Mild atherosclerotic disease in the cavernous carotid bilaterally without stenosis. Atherosclerotic calcification in the M1 and M2 segments bilaterally without significant stenosis. Anterior cerebral arteries patent bilaterally. Posterior circulation: Both vertebral arteries patent to the basilar. Basilar widely patent. PICA patent bilaterally. AICA, superior cerebellar, posterior cerebral arteries patent  bilaterally. Mild atherosclerotic disease in the posterior cerebral arteries bilaterally with calcific atherosclerotic disease. Venous sinuses: Not well evaluated due to timing of injection. Anatomic variants: None Delayed phase: Not perform Review of the MIP images confirms the above findings IMPRESSION: No significant carotid or vertebral artery stenosis in the neck. Mild atherosclerotic disease in the middle cerebral arteries bilaterally and mild atherosclerotic disease in the posterior cerebral arteries bilaterally. Negative for emergent large vessel occlusion. These results were called by telephone at the time of interpretation on 09/22/2017 at 10:49 am to Dr. Arther Dames , who verbally acknowledged these results. Electronically Signed   By: Marlan Palau M.D.   On: 09/22/2017 10:49   Ct Head Code Stroke Wo Contrast  Result Date: 09/22/2017 CLINICAL DATA:  Code stroke.  Left-sided weakness. EXAM: CT HEAD WITHOUT CONTRAST TECHNIQUE: Contiguous axial images were obtained from the base of the skull through the vertex without intravenous contrast. COMPARISON:  CT head 09/13/2014 FINDINGS: Brain: Advanced atrophy. Moderate to advanced chronic microvascular ischemic changes in the white matter. Progressive atrophy since 2016. Negative for acute infarct, hemorrhage, or mass lesion. Vascular: Atherosclerotic calcification extends into the middle cerebral arteries bilaterally. Negative for hyperdense vessel Skull: Negative Sinuses/Orbits: Mild mucosal  edema paranasal sinuses. Bilateral cataract surgery Other: None ASPECTS ([place] Stroke Program Early CT Score) - Ganglionic level infarction (caudate, lentiform nuclei, internal capsule, insula, M1-M3 cortex): 7 - Supraganglionic infarction (M4-M6 cortex): 3 Total score (0-10 with 10 being normal): 10 IMPRESSION: 1. No acute infarct identified. Extensive atrophy. Moderate chronic microvascular ischemia in the white matter 2. ASPECTS is 10 Electronically Signed    By: Marlan Palauharles  Clark M.D.   On: 09/22/2017 10:16    Procedures Procedures (including critical care time)  Medications Ordered in ED Medications  amiodarone (PACERONE) tablet 100 mg (has no administration in time range)  citalopram (CELEXA) tablet 20 mg (has no administration in time range)  traZODone (DESYREL) tablet 25 mg (has no administration in time range)  pantoprazole (PROTONIX) EC tablet 40 mg (has no administration in time range)  Vitamin D (Ergocalciferol) (DRISDOL) capsule 50,000 Units (has no administration in time range)   stroke: mapping our early stages of recovery book (has no administration in time range)  0.9 %  sodium chloride infusion (has no administration in time range)  acetaminophen (TYLENOL) tablet 650 mg (has no administration in time range)    Or  acetaminophen (TYLENOL) solution 650 mg (has no administration in time range)    Or  acetaminophen (TYLENOL) suppository 650 mg (has no administration in time range)  enoxaparin (LOVENOX) injection 30 mg (has no administration in time range)  senna-docusate (Senokot-S) tablet 2 tablet (has no administration in time range)  iopamidol (ISOVUE-370) 76 % injection 50 mL (50 mLs Intravenous Contrast Given 09/22/17 1042)     Initial Impression / Assessment and Plan / ED Course  I have reviewed the triage vital signs and the nursing notes.  Pertinent labs & imaging results that were available during my care of the patient were reviewed by me and considered in my medical decision making (see chart for details).     [name] is a 84 y.o. female with a past medical history significant for stroke, subarachnoid hemorrhage, hypertension, CAD, atrial fibrillation not on blood thinners, hyperlipidemia, and dementia who presents as a code stroke.  According to EMS, patient was last normal at 10:15 PM last night.  This morning patient was noticed to have left facial droop, left arm weakness, and left leg weakness as well as slurred  speech.  Patient was Zenaida NieceVan positive by their assessment and was made a code stroke.   On arrival, patient had minimal dysarthria but was weak in her left face, left arm, and left leg.  Patient taken quickly to the CT scan for evaluation.  Anticipate following up on neurology recommendation's.  On my exam, patient had left facial droop.  She also had left grip strength decreased and left leg strength decreased.  Normal sensation throughout.  Clear speech.  Pupils are symmetric and reactive.  Lungs clear and chest nontender.  Abdomen nontender.  Neurology recommended admission for MRI and further stroke work-up.   Hospitalist called and patient admitted to medicine.   Final Clinical Impressions(s) / ED Diagnoses   Final diagnoses:  Left-sided weakness    Clinical Impression: 1. Left-sided weakness     Disposition: Admit  This note was prepared with assistance of Dragon voice recognition software. Occasional wrong-word or sound-a-like substitutions may have occurred due to the inherent limitations of voice recognition software.      Lexandra Rettke, Canary Brimhristopher J, MD 09/22/17 [phone]

## 2017-09-22 NOTE — ED Triage Notes (Signed)
See stroke log, [facility]

## 2017-09-22 NOTE — Consult Note (Addendum)
Neurology Consultation  Reason for Consult: Code stroke Referring Physician: Tegler  CC: left side weakness and dysarthria  History is obtained from: fFamily and EMS  HPI: [name] is a 84 y.o. female past medical history of hypertension, hemorraghic CVA, GI BLEED.hyperlipidemia, dementia, carotid stenosis, atrial fibrillation however not on any anticoagulations.  Patient was last seen at 2215 on 09/21/2017 by staff at the nursing home.  This morning she was noted to be a phasic and not moving her left arm or left leg.  For that reason EMS was called.  Upon arrival patient was able to name objects, follow commands, talk with dysarthria.  Discussion was had with family members who state that she is wheelchair-bound and needs a two-person assist.  At this point due to the fact that patient is outside the window for TPA and CTA did not show any LVO no further major work-up was recommended.   LKW: 22:15 tpa given?: no, out of window Premorbid modified Rankin scale (mRS): 4 NIHSS 6   ROS: A 14 point ROS was performed and is negative except as noted in the HPI.   Past Medical History:  Diagnosis Date  . Aortic insufficiency Feb 2010   Mild to moderate per echo with normal EF  . Arthritis   . Atrial fibrillation with rapid ventricular response [facility](HCC) Feb 2010  . CAD (coronary artery disease)    2010.  60% RCA, 60 - 70% LAD  . Carotid stenosis    40 - 59% bilateral  . Compression fracture   . Dementia   . Flash pulmonary edema [facility](HCC) Feb 2010   Due to atrial fib with RVR  . Hyperlipidemia   . Hypertension   . IBS (irritable bowel syndrome)   . PUD (peptic ulcer disease)   . Raynaud phenomenon   . Stroke (HCC)   . Vitamin D deficiency     Family History  Problem Relation Age of Onset  . Stroke Mother   . Cancer Mother   . Heart attack Father     Social History:   reports that she has never smoked. She has never used smokeless tobacco. She reports that she does not drink  alcohol or use drugs.  Medications No current facility-administered medications for this encounter.   Current Outpatient Medications:  .  acetaminophen (TYLENOL) 325 MG tablet, Take 325 mg by mouth every 4 (four) hours as needed for moderate pain., Disp: , Rfl:  .  amiodarone (PACERONE) 200 MG tablet, TAKE 1/2 TABLET BY MOUTH EVERY DAY, Disp: 30 tablet, Rfl: 6 .  atorvastatin (LIPITOR) 20 MG tablet, Take 20 mg by mouth daily at 6 PM. , Disp: , Rfl:  .  citalopram (CELEXA) 20 MG tablet, Take 20 mg by mouth daily. , Disp: , Rfl:  .  denosumab (PROLIA) 60 MG/ML SOLN injection, Inject 60 mg into the skin every 6 (six) months. Administer in upper arm, thigh, or abdomen, Disp: , Rfl:  .  magnesium hydroxide (MILK OF MAGNESIA) 400 MG/5ML suspension, Take 20 mLs by mouth daily as needed for mild constipation., Disp: , Rfl:  .  OVER THE COUNTER MEDICATION, Take 120 mLs by mouth 2 (two) times daily. Med pass, Disp: , Rfl:  .  pantoprazole (PROTONIX) 40 MG tablet, Take 1 tablet (40 mg total) by mouth daily., Disp: , Rfl:  .  polyethylene glycol (MIRALAX / GLYCOLAX) packet, Take 17 g by mouth daily as needed for moderate constipation., Disp: 14 each, Rfl: 0 .  potassium chloride (K-DUR) 10 MEQ tablet, Take 10 mEq by mouth daily., Disp: , Rfl:  .  sennosides-docusate sodium (SENOKOT-S) 8.6-50 MG tablet, Take 1 tablet by mouth at bedtime as needed for constipation., Disp: 10 tablet, Rfl: 0 .  traZODone (DESYREL) 50 MG tablet, Take 25 mg by mouth at bedtime., Disp: , Rfl:  .  valsartan (DIOVAN) 160 MG tablet, takes 160mg  by mouth once daily, Disp: , Rfl:  .  Vitamin D, Ergocalciferol, (DRISDOL) 50000 UNITS CAPS, Take 50,000 Units by mouth every Wednesday. , Disp: , Rfl:    Exam: Current vital signs: BP (!) 156/57   Pulse 62   Resp 17  Vital signs in last 24 hours: Pulse Rate:  [62-67] 62 (08/26 1015) Resp:  [14-17] 17 (08/26 1015) BP: (150-156)/(57-70) 156/57 (08/26 1015)  GENERAL: Awake, alert in  NAD HEENT: - Normocephalic and atraumatic, LUNGS - Clear to auscultation bilaterally  CV - irregular irregular ABDOMEN - Soft, nontender, nondistended  Ext: warm, well perfused, intact peripheral pulses,  NEURO:  Mental Status: AA&Ox3, speech is dysarthric.  Naming, repetition,and comprehension intact. Cranial Nerves: PERRL 2 mm/brisk. EOMI, visual fields full, left facial asymmetry, facial sensation intact, hearing intact, tongue/uvula/soft palate midline,  Motor: 5/5 on the right arm and leg, 1/5 on left arm with 0/5 on the left leg Tone: is normal and bulk is normal Sensation- Intact to light touch bilaterally Coordination: Finger-to-nose was intact Gait- deferred  Labs I have reviewed labs in epic and the results pertinent to this consultation are:   CBC    Component Value Date/Time   WBC 9.0 09/22/2017 0958   RBC 3.69 (L) 09/22/2017 0958   HGB 10.5 (L) 09/22/2017 1001   HGB 11.1 11/02/2014 0954   HCT 31.0 (L) 09/22/2017 1001   HCT 33.9 (L) 11/02/2014 0954   PLT 210 09/22/2017 0958   PLT 223 11/02/2014 0954   MCV 93.2 09/22/2017 0958   MCV 92 11/02/2014 0954   MCH 29.3 09/22/2017 0958   MCHC 31.4 09/22/2017 0958   RDW 13.6 09/22/2017 0958   RDW 14.7 11/02/2014 0954   LYMPHSABS 2.4 09/22/2017 0958   MONOABS 0.7 09/22/2017 0958   EOSABS 0.0 09/22/2017 0958   BASOSABS 0.0 09/22/2017 0958    CMP     Component Value Date/Time   NA 136 09/22/2017 1001   NA 140 06/18/2016   K 4.2 09/22/2017 1001   CL 101 09/22/2017 1001   CO2 27 03/22/2016 0534   GLUCOSE 98 09/22/2017 1001   BUN 14 09/22/2017 1001   BUN 19 06/18/2016   CREATININE 0.70 09/22/2017 1001   CREATININE 0.70 10/31/2015 1406   CALCIUM 8.7 (L) 03/22/2016 0534   PROT 6.7 03/19/2016 2026   ALBUMIN 2.9 (L) 03/19/2016 2026   AST 19 03/19/2016 2026   ALT 15 03/19/2016 2026   ALKPHOS 114 03/19/2016 2026   BILITOT 0.9 03/19/2016 2026   GFRNONAA >60 03/22/2016 0534   GFRAA >60 03/22/2016 0534    Lipid  Panel     Component Value Date/Time   CHOL 118 06/17/2014 0828   TRIG 96 06/17/2014 0828   HDL 37 (L) 06/17/2014 0828   CHOLHDL 3.2 06/17/2014 0828   VLDL 19 06/17/2014 0828   LDLCALC 62 06/17/2014 0828     Imaging I have reviewed the images obtained:  CT-scan of the brain--no acute infarct identified.  Extensive atrophy.  CTA of head and neck--pending  Assessment: Patient likely had a right internal capsule/basal ganglia infarct.  As patient  was out of the window for TPA and CTA did not show LVO no further major stroke diagnostics are recommended.  Current time patient is off anticoagulation secondary to multiple falls.  Due to her hemorrhagic intracranial bleed in the past and GI bleed in the past I would not recommend placing patient on anticoagulation.  Patient would benefit from a limited stroke work-up including a below.   Recommendations: #MRI brain #Echocardiogram #81 mg aspirin daily     NEUROHOSPITALIST ADDENDUM I performed a face-to-face evaluation with the patient today. I have reviewed the contents of history and physical exam as documented by PA/ARNP/Resident and agree with above documentation.  I have discussed and formulated the above plan as documented. Edits to the note have been made as needed.   Patient is a 84 year old female living nursing home.  Presents with acute left-sided weakness noted by nursing staff today morning.  Not a candidate for TPA she is also the window CTA was negative for large vessel occlusion.  Patient has atrial fibrillation however is not on any anticoagulation for several reasons-has GI ulcers, multiple falls.  Also has history of hemorrhagic CVA.  I think circulation can be limited as a cause of stroke is likely from atrial fibrillation.  An echocardiogram performed to rule out LV thrombus.  Patient does require swallow evaluation.     Georgiana Spinner Aroor MD [facility] [phone]   If 7pm to 7am, please call on call  as listed on AMION.

## 2017-09-22 NOTE — ED Notes (Signed)
2 ativan given=- [facility]

## 2017-09-22 NOTE — Code Documentation (Signed)
84 yo female coming from [facility] with complaints of left sided facial droop and left arm weakness that was noted this morning by staff. Pt is coming from facility and was last known well last night at 2200. EMS was called and activated Code Stroke. Stroke Team met patient in CT. Pt is alert and oriented x4. CT Head negative. CTA complete. Initial NIHSS 6 due to left side weakness, left facial droop, and slight dysarthria. No tPA due to patient being outside window. Not an IR candidate. Family at the bedside. No acute treatment at this time. Pt to be admitted. Handoff given to Santiago Glad, Therapist, sports.

## 2017-09-23 ENCOUNTER — Inpatient Hospital Stay (HOSPITAL_COMMUNITY): Payer: Medicare Other

## 2017-09-23 DIAGNOSIS — R627 Adult failure to thrive: Secondary | ICD-10-CM

## 2017-09-23 DIAGNOSIS — I351 Nonrheumatic aortic (valve) insufficiency: Secondary | ICD-10-CM

## 2017-09-23 LAB — ECHOCARDIOGRAM COMPLETE: WEIGHTICAEL: 1555.57 [oz_av]

## 2017-09-23 MED ORDER — PRAVASTATIN SODIUM 20 MG PO TABS
20.0000 mg | ORAL_TABLET | Freq: Every day | ORAL | Status: DC
  Administered 2017-09-23: 20 mg via ORAL
  Filled 2017-09-23: qty 1

## 2017-09-23 MED ORDER — DEXTROSE IN LACTATED RINGERS 5 % IV SOLN
INTRAVENOUS | Status: AC
  Administered 2017-09-23: 12:00:00 via INTRAVENOUS

## 2017-09-23 NOTE — Progress Notes (Signed)
PROGRESS NOTE  [name] YNW:[id number] DOB: 05-04-1920 DOA: 09/22/2017 PCP: Rodrigo Ran, MD  Brief summary:  Patient is from snf, "baseline wheelchair bound, mentally very sharp" per family She is brought in due to left sided weakness , found to have acute CVA,  She has h/o afib, not a candidate for anticoagulation.   Family reports gradual decline in appetite with unintentional weight loss over the last year.  HPI/Recap of past 24 hours:  Daughter reports patient's mental status has changed  In the last hour or so Patient does not move her left side at all, she does not talk ( while she had no speech issues yesterday)   Assessment/Plan: Principal Problem:   Stroke [facility]) Active Problems:   PAF (paroxysmal atrial fibrillation) (HCC)   Aortic valve disease   CAD (coronary artery disease) of artery bypass graft   Essential hypertension   Hyperlipidemia  Acute small RIGHT basal ganglia infarct -she presented with left sided weakness, but symptom seems has progressed since this am, she now does not move her left side, she started to have slurred speech -case discussed with neurology Dr Roda Shutters who recommend may consider ct head in am ( patient is not a good candidate for mri due to claustrophobia), patient is not a candidate for any intervention or anticoagulation, continue asa/statin/PT/OT/speech -if symptom improve, may d/c to SNF with outpatient neurology follow up. -Daughter understand if stoke symptom progress may need palliative care /comfort measures    Paroxysmal atrial fibrillation.  -Not on anticoagulation (bilateral BG ICH, traumatic SDH/SAH, GI bleeding while on eliquis in the past ) -Continue amiodorone, asa 81mg    FTT: PT/OT snf  Code Status: partial code   Family Communication: patient and daughter  Disposition Plan: snf    Consultants:  neurology  Procedures:  none  Antibiotics:  none   Objective: BP (!) 144/64 (BP Location: Left Arm)    Pulse 80   Temp 98.2 F (36.8 C) (Oral)   Resp 14   Wt 44.1 kg   SpO2 98%   BMI 17.22 kg/m   Intake/Output Summary (Last 24 hours) at 09/23/2017 1051 Last data filed at 09/23/2017 0707 Gross per 24 hour  Intake 412.14 ml  Output 300 ml  Net 112.14 ml   Filed Weights   09/22/17 1106  Weight: 44.1 kg    Exam: Patient is examined daily including today on 09/23/2017, exams remain the same as of yesterday except that has changed    General:  Frail, lethargic, hard of hearing, dysarthria   Cardiovascular: RRR  Respiratory: CTABL  Abdomen: Soft/ND/NT, positive BS  Musculoskeletal: No Edema  Neuro: lethargic, dysarthria, left sided weakness  Data Reviewed: Basic Metabolic Panel: Recent Labs  Lab 09/22/17 0958 09/22/17 1001  NA 139 136  K 4.3 4.2  CL 103 101  CO2 28  --   GLUCOSE 101* 98  BUN 11 14  CREATININE 0.68 0.70  CALCIUM 8.8*  --    Liver Function Tests: Recent Labs  Lab 09/22/17 0958  AST 17  ALT 10  ALKPHOS 93  BILITOT 0.7  PROT 6.1*  ALBUMIN 3.0*   No results for input(s): LIPASE, AMYLASE in the last 168 hours. No results for input(s): AMMONIA in the last 168 hours. CBC: Recent Labs  Lab 09/22/17 0958 09/22/17 1001  WBC 9.0  --   NEUTROABS 5.7  --   HGB 10.8* 10.5*  HCT 34.4* 31.0*  MCV 93.2  --   PLT 210  --  Cardiac Enzymes:   No results for input(s): CKTOTAL, CKMB, CKMBINDEX, TROPONINI in the last 168 hours. BNP (last 3 results) No results for input(s): BNP in the last 8760 hours.  ProBNP (last 3 results) No results for input(s): PROBNP in the last 8760 hours.  CBG: Recent Labs  Lab 09/22/17 0957  GLUCAP 97    No results found for this or any previous visit (from the past 240 hour(s)).   Studies: Mr Brain Wo Contrast  Result Date: 09/22/2017 CLINICAL DATA:  LEFT-sided weakness, LEFT facial droop. History of carotid stenosis, atrial fibrillation, hypertension, hyperlipidemia and dementia. EXAM: MRI HEAD WITHOUT  CONTRAST TECHNIQUE: Multiplanar, multiecho pulse sequences of the brain and surrounding structures were obtained without intravenous contrast. COMPARISON:  CT HEAD September 22, 2017. FINDINGS: INTRACRANIAL CONTENTS: Subcentimeter reduced diffusion RIGHT basal ganglia with low ADC values. Numerous chronic supra and infratentorial microhemorrhages, with confluent susceptibility artifact in basal ganglia and thalami associated with chronic hypertension. Moderate to severe parenchymal brain volume loss most conspicuous within mesial temporal lobes. No hydrocephalus. Confluent supratentorial white matter FLAIR T2 hyperintensities. Old bilateral basal ganglia and thalami lacunar infarcts. Prominent basal ganglia and thalami perivascular spaces associated with chronic small vessel ischemic changes. Old small bilateral cerebellar infarcts. No midline shift, mass effect or masses. No abnormal extra-axial fluid collections. VASCULAR: Normal major intracranial vascular flow voids present at skull base. SKULL AND UPPER CERVICAL SPINE: No abnormal sellar expansion. Generally bright bone marrow signal compatible with osteopenia. No suspicious calvarial bone marrow signal. Craniocervical junction maintained. SINUSES/ORBITS: The mastoid air-cells and included paranasal sinuses are well-aerated.The included ocular globes and orbital contents are non-suspicious. Status post bilateral ocular lens implants. OTHER: Patient is edentulous. IMPRESSION: 1. Acute small RIGHT basal ganglia infarct. 2. Multiple old basal ganglia, thalami and cerebellar small infarcts. 3. Severe chronic small vessel ischemic changes. Extensive susceptibility artifact in a pattern associated with chronic hypertension. 4. Advanced parenchymal brain volume loss with disproportionate mesial temporal lobe atrophy associated with chronic small vessel ischemic changes. Electronically Signed   By: Awilda Metroourtnay  Bloomer M.D.   On: 09/22/2017 20:33    Scheduled Meds: .  amiodarone  100 mg Oral Daily  . aspirin  81 mg Oral Daily  . citalopram  20 mg Oral Daily  . enoxaparin (LOVENOX) injection  30 mg Subcutaneous Q24H  . pantoprazole  40 mg Oral Daily  . pravastatin  20 mg Oral q1800  . traZODone  25 mg Oral QHS  . [START ON 09/24/2017] Vitamin D (Ergocalciferol)  50,000 Units Oral Q Wed    Continuous Infusions: . dextrose 5% lactated ringers       Time spent: 35mins, case discussed with neurology I have personally reviewed and interpreted on  09/23/2017 daily labs, tele strips, imagings as discussed above under date review session and assessment and plans.  I reviewed all nursing notes, pharmacy notes, consultant notes,  vitals, pertinent old records  I have discussed plan of care as described above with RN , patient and family on 09/23/2017   Albertine GratesFang Jlyn Cerros MD, PhD  [facility] Pager [phone]. If 7PM-7AM, please contact night-coverage at [email or web address], password [email or web address] 09/23/2017, 10:51 AM  LOS: 1 day

## 2017-09-23 NOTE — Evaluation (Signed)
Occupational Therapy Evaluation Patient Details Name: [name] MRN: [id number] DOB: 05/07/1920 Today's Date: 09/23/2017    History of Present Illness 84 y.o. female admitted with aphasia, and Lt sided weakness.   MRI showed acute small Rt basal ganglia infarct, multiple old basal ganglia, thalamic, and cerebellar infarcts, chronic small vessel ischemia, advanced parenchymal brain volume loss with disproportionate mesial temporal lobe atrophy associated with chronic small vessel disease.  PMH includes:   CVA, dementia, CAD     Clinical Impression   Pt admitted with above.  She demonstrates the below listed deficits.  She currently requires set up for self feeding, min A for simple grooming, and max A, overall for ADLs, mod A +2 for functional transfers.   [facility].  Per her daughter, she required assist for ADLs, but was abl to transfer to w/c, but had sustained several falls.  Recommend return to SNF for continued rehab.  All further OT needs can be addressed at SNF.     Follow Up Recommendations  SNF    Equipment Recommendations  None recommended by OT    Recommendations for Other Services       Precautions / Restrictions Precautions Precautions: Fall      Mobility Bed Mobility Overal bed mobility: Needs Assistance Bed Mobility: Rolling;Sidelying to Sit Rolling: Mod assist Sidelying to sit: Mod assist;HOB elevated       General bed mobility comments: verbal cues for sequencing.  Assist to roll to Lt and push up into sitting   Transfers Overall transfer level: Needs assistance Equipment used: 1 person hand held assist Transfers: Squat Pivot Transfers     Squat pivot transfers: Mod assist;+2 physical assistance     General transfer comment: assist to lift buttocks from bed and to pivot     Balance Overall balance assessment: Needs assistance Sitting-balance support: Feet supported;Single extremity supported Sitting  balance-Leahy Scale: Poor Sitting balance - Comments: requires min A to maintain EOB sitting statically  Postural control: Left lateral lean                                 ADL either performed or assessed with clinical judgement   ADL Overall ADL's : Needs assistance/impaired Eating/Feeding: Set up;Sitting;Bed level   Grooming: Wash/dry hands;Wash/dry face;Oral care;Brushing hair;Minimal assistance;Sitting;Bed level   Upper Body Bathing: Moderate assistance;Sitting   Lower Body Bathing: Maximal assistance;Sit to/from stand   Upper Body Dressing : Maximal assistance;Sitting   Lower Body Dressing: Total assistance;Sit to/from stand   Toilet Transfer: Moderate assistance;+2 for physical assistance;Squat-pivot;BSC   Toileting- Clothing Manipulation and Hygiene: Total assistance;Sit to/from stand       Functional mobility during ADLs: Moderate assistance;+2 for physical assistance       Vision Baseline Vision/History: Wears glasses Wears Glasses: At all times Patient Visual Report: No change from baseline Vision Assessment?: No apparent visual deficits Additional Comments: Pt denies visual changes.  She is an avid reader and is able to read newspaper      Perception Perception Perception Tested?: Yes   Praxis Praxis Praxis tested?: Within functional limits    Pertinent Vitals/Pain Pain Assessment: No/denies pain     Hand Dominance Right   Extremity/Trunk Assessment Upper Extremity Assessment Upper Extremity Assessment: LUE deficits/detail LUE Deficits / Details: Pt with very limited active movement finger flex/ext, elbow flex/ext and shoulder flexion.  Is unable to use Lt UE functionally  LUE Coordination: decreased fine motor;decreased gross motor   Lower Extremity Assessment Lower Extremity Assessment: Defer to PT evaluation   Cervical / Trunk Assessment Cervical / Trunk Assessment: Kyphotic   Communication Communication Communication: HOH    Cognition Arousal/Alertness: Awake/alert Behavior During Therapy: WFL for tasks assessed/performed Overall Cognitive Status: History of cognitive impairments - at baseline                                     General Comments       Exercises     Shoulder Instructions      Home Living Family/patient expects to be discharged to:: Skilled nursing facility                                 Additional Comments: Pt has been residing at [facility]       Prior Functioning/Environment Level of Independence: Needs assistance  Gait / Transfers Assistance Needed: Per daughter, pt was working with therapies at SNF, and was w/c bound.  Daughter reports pt used pole for transfers mod I, but had frequent falls  ADL's / Homemaking Assistance Needed: Per daughter, pt required assist with bathing and dressing,   She did perform toileting mod I, but had frequent falls             OT Problem List: Decreased strength;Decreased range of motion;Decreased activity tolerance;Impaired balance (sitting and/or standing);Decreased coordination;Decreased cognition;Decreased safety awareness;Decreased knowledge of use of DME or AE;Impaired UE functional use;Impaired tone      OT Treatment/Interventions: Self-care/ADL training;Neuromuscular education;DME and/or AE instruction;Splinting;Therapeutic activities;Cognitive remediation/compensation;Patient/family education;Balance training    OT Goals(Current goals can be found in the care plan section) Acute Rehab OT Goals Patient Stated Goal: Pt did not states.  Family hopes for her to return to rehab and regain as much function as movement as possible  OT Goal Formulation: All assessment and education complete, DC therapy  OT Frequency: Min 2X/week   Barriers to D/C: Decreased caregiver support          Co-evaluation PT/OT/SLP Co-Evaluation/Treatment: Yes Reason for Co-Treatment: Complexity of the patient's impairments  (multi-system involvement);For patient/therapist safety   OT goals addressed during session: ADL's and self-care      AM-PAC PT "6 Clicks" Daily Activity     Outcome Measure Help from another person eating meals?: A Little Help from another person taking care of personal grooming?: A Little Help from another person toileting, which includes using toliet, bedpan, or urinal?: A Lot Help from another person bathing (including washing, rinsing, drying)?: A Lot Help from another person to put on and taking off regular upper body clothing?: A Lot Help from another person to put on and taking off regular lower body clothing?: Total 6 Click Score: 13   End of Session Nurse Communication: Mobility status  Activity Tolerance: Patient tolerated treatment well Patient left: in chair;with call bell/phone within reach;with family/visitor present  OT Visit Diagnosis: Unsteadiness on feet (R26.81);Hemiplegia and hemiparesis Hemiplegia - Right/Left: Left Hemiplegia - dominant/non-dominant: Non-Dominant Hemiplegia - caused by: Cerebral infarction                Time: 0981-1914 OT Time Calculation (min): 17 min Charges:  OT General Charges $OT Visit: 1 Visit OT Evaluation $OT Eval Moderate Complexity: 1 Mod  Ezekiah Massie, OTR/L [phone]   Jeani Hawking M 09/23/2017, 1:38 PM

## 2017-09-23 NOTE — Evaluation (Signed)
Physical Therapy Evaluation Patient Details Name: [name] MRN: [id number] DOB: 1920/03/08 Today's Date: 09/23/2017   History of Present Illness  84 y.o. female admitted with aphasia, and Lt sided weakness.   MRI showed acute small Rt basal ganglia infarct, multiple old basal ganglia, thalamic, and cerebellar infarcts, chronic small vessel ischemia, advanced parenchymal brain volume loss with disproportionate mesial temporal lobe atrophy associated with chronic small vessel disease.  PMH includes:   CVA, dementia, CAD    Clinical Impression  Orders received for PT evaluation. Patient demonstrates deficits in functional mobility as indicated below. Will benefit from continued skilled PT to address deficits and maximize function. Will see as indicated and progress as tolerated.      Follow Up Recommendations SNF;Supervision for mobility/OOB    Equipment Recommendations  None recommended by PT    Recommendations for Other Services       Precautions / Restrictions Precautions Precautions: Fall      Mobility  Bed Mobility Overal bed mobility: Needs Assistance Bed Mobility: Rolling;Sidelying to Sit Rolling: Mod assist Sidelying to sit: Mod assist;HOB elevated       General bed mobility comments: verbal cues for sequencing.  Assist to roll to Lt and push up into sitting   Transfers Overall transfer level: Needs assistance Equipment used: 1 person hand held assist Transfers: Squat Pivot Transfers     Squat pivot transfers: Mod assist;+2 physical assistance     General transfer comment: assist to lift buttocks from bed and to pivot   Ambulation/Gait                Stairs            Wheelchair Mobility    Modified Rankin (Stroke Patients Only) Modified Rankin (Stroke Patients Only) Pre-Morbid Rankin Score: Moderately severe disability Modified Rankin: Severe disability     Balance Overall balance assessment: Needs assistance Sitting-balance support:  Feet supported;Single extremity supported Sitting balance-Leahy Scale: Poor Sitting balance - Comments: requires min A to maintain EOB sitting statically  Postural control: Left lateral lean                                   Pertinent Vitals/Pain Pain Assessment: No/denies pain    Home Living Family/patient expects to be discharged to:: Skilled nursing facility                 Additional Comments: Pt has been residing at [facility]     Prior Function Level of Independence: Needs assistance   Gait / Transfers Assistance Needed: Per daughter, pt was working with therapies at SNF, and was w/c bound.  Daughter reports pt used pole for transfers mod I, but had frequent falls   ADL's / Homemaking Assistance Needed: Per daughter, pt required assist with bathing and dressing,   She did perform toileting mod I, but had frequent falls         Hand Dominance   Dominant Hand: Right    Extremity/Trunk Assessment   Upper Extremity Assessment Upper Extremity Assessment: LUE deficits/detail LUE Deficits / Details: Pt with very limited active movement finger flex/ext, elbow flex/ext and shoulder flexion.  Is unable to use Lt UE functionally  LUE Coordination: decreased fine motor;decreased gross motor    Lower Extremity Assessment Lower Extremity Assessment: LLE deficits/detail LLE Deficits / Details: noted assymetrical weakness, 2-/5 gross motions LLE Coordination: decreased fine motor;decreased gross motor    Cervical /  Trunk Assessment Cervical / Trunk Assessment: Kyphotic  Communication   Communication: HOH  Cognition Arousal/Alertness: Awake/alert Behavior During Therapy: WFL for tasks assessed/performed Overall Cognitive Status: History of cognitive impairments - at baseline                                        General Comments      Exercises     Assessment/Plan    PT Assessment Patient needs continued PT services  PT Problem  List Decreased strength;Decreased activity tolerance;Decreased balance;Decreased mobility;Decreased coordination       PT Treatment Interventions DME instruction;Gait training;Functional mobility training;Therapeutic activities;Therapeutic exercise;Balance training;Neuromuscular re-education;Patient/family education;Wheelchair mobility training    PT Goals (Current goals can be found in the Care Plan section)  Acute Rehab PT Goals Patient Stated Goal: Pt did not states.  Family hopes for her to return to rehab and regain as much function as movement as possible  PT Goal Formulation: With patient/family Time For Goal Achievement: 10/07/17 Potential to Achieve Goals: Fair    Frequency Min 2X/week   Barriers to discharge        Co-evaluation PT/OT/SLP Co-Evaluation/Treatment: Yes Reason for Co-Treatment: Complexity of the patient's impairments (multi-system involvement);For patient/therapist safety   OT goals addressed during session: ADL's and self-care       AM-PAC PT "6 Clicks" Daily Activity  Outcome Measure Difficulty turning over in bed (including adjusting bedclothes, sheets and blankets)?: Unable Difficulty moving from lying on back to sitting on the side of the bed? : Unable Difficulty sitting down on and standing up from a chair with arms (e.g., wheelchair, bedside commode, etc,.)?: Unable Help needed moving to and from a bed to chair (including a wheelchair)?: A Lot Help needed walking in hospital room?: Total Help needed climbing 3-5 steps with a railing? : Total 6 Click Score: 7    End of Session   Activity Tolerance: Patient tolerated treatment well;Patient limited by fatigue Patient left: in chair;with call bell/phone within reach;with family/visitor present Nurse Communication: Mobility status PT Visit Diagnosis: Other symptoms and signs involving the nervous system (Z61.096(R29.898)    Time: 0454-0981: 1238-1255 PT Time Calculation (min) (ACUTE ONLY): 17 min   Charges:    PT Evaluation $PT Eval Moderate Complexity: 1 Mod          Charlotte Crumbevon Faryn Sieg, PT DPT  Board Certified Neurologic Specialist [phone]   Fabio AsaDevon J Kamela Blansett 09/23/2017, 1:56 PM

## 2017-09-23 NOTE — Evaluation (Signed)
Clinical/Bedside Swallow Evaluation Patient Details  Name: [name] MRN: [id number] Date of Birth: 04/04/1920  Today's Date: 09/23/2017 Time: SLP Start Time (ACUTE ONLY): 1125 SLP Stop Time (ACUTE ONLY): 1155 SLP Time Calculation (min) (ACUTE ONLY): 30 min  Past Medical History:  Past Medical History:  Diagnosis Date  . Aortic insufficiency Feb 2010   Mild to moderate per echo with normal EF  . Arthritis   . Atrial fibrillation with rapid ventricular response [facility](HCC) Feb 2010  . CAD (coronary artery disease)    2010.  60% RCA, 60 - 70% LAD  . Carotid stenosis    40 - 59% bilateral  . Compression fracture   . Dementia   . Flash pulmonary edema [facility](HCC) Feb 2010   Due to atrial fib with RVR  . Hyperlipidemia   . Hypertension   . IBS (irritable bowel syndrome)   . PUD (peptic ulcer disease)   . Raynaud phenomenon   . Stroke (HCC)   . Vitamin D deficiency    Past Surgical History:  Past Surgical History:  Procedure Laterality Date  . APPENDECTOMY    . CARDIAC CATHETERIZATION  03/11/2008   EF 45%  . TRANSTHORACIC ECHOCARDIOGRAM  03/06/2008   EF 55%   HPI:  [name] is a delightful 84 y.o. female with medical history significant for Hypertension, hemorrhagic CVA, GI bleed, hyperlipidemia, carotid stenosis, A. Fib not on anticoagulation presents to the emergency Department chief complaint left-sided weakness. Upon arrival patient was able to name objects all commands and talk with dysarthria. Patient is mostly wheelchair bound. Family reports gradual decline in appetite with unintentional weight loss over the last year.   Assessment / Plan / Recommendation Clinical Impression   Pt presents with s/s of a mild-moderate dysphagia.  I suspect there is some level of underlying, chronic, esophageal dysfunction given pt's daughter's reports of frequent belching with meals, audible swallows, and frequent coughing episodes at baseline.  It is likely that baseline deficits are now  exacerbated in the setting of acute right basal ganglia infarct with greater oropharyngeal compromise.  Pt tolerated thin liquids, even when taking large consecutive sips via straw, but she had immediate, prolonged cough following mixed dry solid and liquid consistency.  No coughing with softer, more moist solids.  Pt would attempt to drink in the middle of a coughing episode which would then lead to increased coughing; however, when cued to wait to drink until coughing had completely subsided, pt had no overt s/s of aspiration.  SLP provided education regarding swallowing precautions with pt and pt's daughter.  All questions were answered to their satisfaction at this time.  For now, recommend that pt remain on dys 2 textures and thin liquids, meds whole in puree one at a time.   SLP Visit Diagnosis: Dysphagia, oropharyngeal phase (R13.12);Dysphagia, pharyngoesophageal phase (R13.14)    Aspiration Risk  Mild aspiration risk    Diet Recommendation Dysphagia 2 (Fine chop);Thin liquid   Liquid Administration via: Cup;Straw Medication Administration: Whole meds with puree Supervision: Patient able to self feed;Full supervision/cueing for compensatory strategies Compensations: Minimize environmental distractions;Slow rate;Small sips/bites Postural Changes: Remain upright for at least 30 minutes after po intake;Seated upright at 90 degrees    Other  Recommendations Oral Care Recommendations: Oral care BID   Follow up Recommendations Skilled Nursing facility      Frequency and Duration min 2x/week          Prognosis Prognosis for Safe Diet Advancement: Good  Swallow Study   General HPI: April ANASTAS is a delightful 84 y.o. female with medical history significant for Hypertension, hemorrhagic CVA, GI bleed, hyperlipidemia, carotid stenosis, A. Fib not on anticoagulation presents to the emergency Department chief complaint left-sided weakness. Upon arrival patient was able to name objects all  commands and talk with dysarthria. Patient is mostly wheelchair bound. Family reports gradual decline in appetite with unintentional weight loss over the last year. Type of Study: Bedside Swallow Evaluation Previous Swallow Assessment: BSE 03/22/2016 Diet Prior to this Study: Dysphagia 2 (chopped);Thin liquids Temperature Spikes Noted: No Respiratory Status: Room air History of Recent Intubation: No Behavior/Cognition: Alert;Cooperative;Pleasant mood Oral Cavity Assessment: Dry Oral Care Completed by SLP: No Oral Cavity - Dentition: Dentures, top Vision: Functional for self-feeding Self-Feeding Abilities: Able to feed self Patient Positioning: Upright in bed Baseline Vocal Quality: Low vocal intensity Volitional Cough: Weak(daughter reports cough is weak at baseline) Volitional Swallow: Able to elicit    Oral/Motor/Sensory Function Overall Oral Motor/Sensory Function: Mild impairment Facial ROM: Reduced left Facial Symmetry: Abnormal symmetry left Facial Sensation: Within Functional Limits Lingual ROM: Within Functional Limits Lingual Symmetry: Abnormal symmetry left Lingual Strength: Reduced   Ice Chips     Thin Liquid Thin Liquid: Within functional limits    Nectar Thick     Honey Thick     Puree Puree: Within functional limits   Solid     Solid: Impaired Pharyngeal Phase Impairments: Cough - Immediate      Kyndell Zeiser, Joni Reining L 09/23/2017,12:16 PM

## 2017-09-23 NOTE — Progress Notes (Signed)
  Echocardiogram 2D Echocardiogram has been performed.  [name] 09/23/2017, 11:27 AM

## 2017-09-23 NOTE — Progress Notes (Signed)
STROKE TEAM PROGRESS NOTE   SUBJECTIVE (INTERVAL HISTORY) Her daughter and other families are at the bedside.  Overall she feels her condition is stable. She is awake, alert, however still has left hemiplegia.  As per her daughter, she lives in a nursing facility, wheelchair-bound, however able to use both arms for transition.  However, this time left upper extremity weakness making her daily activity difficult.  However, her daughter would not want aggressive PT/OT, and request to transfer back to facility as soon as stroke work-up finished.   OBJECTIVE Temp:  [97.7 F (36.5 C)-98.6 F (37 C)] 98.6 F (37 C) (08/27 1600) Pulse Rate:  [65-80] 80 (08/27 0813) Cardiac Rhythm: Sinus bradycardia;Atrial fibrillation (08/27 0800) Resp:  [14-18] 14 (08/27 0813) BP: (103-160)/(47-65) 144/64 (08/27 0813) SpO2:  [97 %-99 %] 98 % (08/27 0813)  Recent Labs  Lab 09/22/17 0957  GLUCAP 97   Recent Labs  Lab 09/22/17 0958 09/22/17 1001  NA 139 136  K 4.3 4.2  CL 103 101  CO2 28  --   GLUCOSE 101* 98  BUN 11 14  CREATININE 0.68 0.70  CALCIUM 8.8*  --    Recent Labs  Lab 09/22/17 0958  AST 17  ALT 10  ALKPHOS 93  BILITOT 0.7  PROT 6.1*  ALBUMIN 3.0*   Recent Labs  Lab 09/22/17 0958 09/22/17 1001  WBC 9.0  --   NEUTROABS 5.7  --   HGB 10.8* 10.5*  HCT 34.4* 31.0*  MCV 93.2  --   PLT 210  --    No results for input(s): CKTOTAL, CKMB, CKMBINDEX, TROPONINI in the last 168 hours. Recent Labs    09/22/17 0958  LABPROT 15.0  INR 1.19   No results for input(s): COLORURINE, LABSPEC, PHURINE, GLUCOSEU, HGBUR, BILIRUBINUR, KETONESUR, PROTEINUR, UROBILINOGEN, NITRITE, LEUKOCYTESUR in the last 72 hours.  Invalid input(s): APPERANCEUR     Component Value Date/Time   CHOL 149 09/22/2017 0958   TRIG 148 09/22/2017 0958   HDL 41 09/22/2017 0958   CHOLHDL 3.6 09/22/2017 0958   VLDL 30 09/22/2017 0958   LDLCALC 78 09/22/2017 0958   Lab Results  Component Value Date   HGBA1C  5.5 09/22/2017   No results found for: LABOPIA, COCAINSCRNUR, LABBENZ, AMPHETMU, THCU, LABBARB  No results for input(s): ETH in the last 168 hours.  I have personally reviewed the radiological images below and agree with the radiology interpretations.  Ct Angio Head W Or Wo Contrast  Result Date: 09/22/2017 CLINICAL DATA:  Focal neuro deficit greater than 6 hours. Left-sided weakness. EXAM: CT ANGIOGRAPHY HEAD AND NECK TECHNIQUE: Multidetector CT imaging of the head and neck was performed using the standard protocol during bolus administration of intravenous contrast. Multiplanar CT image reconstructions and MIPs were obtained to evaluate the vascular anatomy. Carotid stenosis measurements (when applicable) are obtained utilizing NASCET criteria, using the distal internal carotid diameter as the denominator. CONTRAST:  50mL ISOVUE-370 IOPAMIDOL (ISOVUE-370) INJECTION 76% COMPARISON:  CT head 09/22/2017 FINDINGS: CTA NECK FINDINGS Aortic arch: Moderate atherosclerotic disease aortic arch. Atherosclerotic disease proximal great vessels. Mild narrowing of the proximal subclavian artery bilaterally. Right carotid system: Tortuous common carotid artery widely patent. Atherosclerotic calcification proximal right internal carotid artery, 25% diameter stenosis Left carotid system: Tortuous left common carotid artery widely patent. Atherosclerotic calcification proximal left internal carotid artery without significant stenosis. Vertebral arteries: Right vertebral dominant. Both vertebral arteries patent to the basilar without significant stenosis. Skeleton: Kyphoscoliosis in the cervical and thoracic spine. Multilevel degenerative changes  in the cervical spine. Other neck: Negative for mass or adenopathy Upper chest: Calcified pleural plaques in the apices bilaterally with apical scarring bilaterally. No acute abnormality. Atherosclerotic aortic arch. Review of the MIP images confirms the above findings CTA HEAD  FINDINGS Anterior circulation: Mild atherosclerotic disease in the cavernous carotid bilaterally without stenosis. Atherosclerotic calcification in the M1 and M2 segments bilaterally without significant stenosis. Anterior cerebral arteries patent bilaterally. Posterior circulation: Both vertebral arteries patent to the basilar. Basilar widely patent. PICA patent bilaterally. AICA, superior cerebellar, posterior cerebral arteries patent bilaterally. Mild atherosclerotic disease in the posterior cerebral arteries bilaterally with calcific atherosclerotic disease. Venous sinuses: Not well evaluated due to timing of injection. Anatomic variants: None Delayed phase: Not perform Review of the MIP images confirms the above findings IMPRESSION: No significant carotid or vertebral artery stenosis in the neck. Mild atherosclerotic disease in the middle cerebral arteries bilaterally and mild atherosclerotic disease in the posterior cerebral arteries bilaterally. Negative for emergent large vessel occlusion. These results were called by telephone at the time of interpretation on 09/22/2017 at 10:49 am to Dr. Arther DamesSUSHANTH AROOR , who verbally acknowledged these results. Electronically Signed   By: Marlan Palauharles  Clark M.D.   On: 09/22/2017 10:49   Ct Angio Neck W Or Wo Contrast  Result Date: 09/22/2017 CLINICAL DATA:  Focal neuro deficit greater than 6 hours. Left-sided weakness. EXAM: CT ANGIOGRAPHY HEAD AND NECK TECHNIQUE: Multidetector CT imaging of the head and neck was performed using the standard protocol during bolus administration of intravenous contrast. Multiplanar CT image reconstructions and MIPs were obtained to evaluate the vascular anatomy. Carotid stenosis measurements (when applicable) are obtained utilizing NASCET criteria, using the distal internal carotid diameter as the denominator. CONTRAST:  50mL ISOVUE-370 IOPAMIDOL (ISOVUE-370) INJECTION 76% COMPARISON:  CT head 09/22/2017 FINDINGS: CTA NECK FINDINGS Aortic  arch: Moderate atherosclerotic disease aortic arch. Atherosclerotic disease proximal great vessels. Mild narrowing of the proximal subclavian artery bilaterally. Right carotid system: Tortuous common carotid artery widely patent. Atherosclerotic calcification proximal right internal carotid artery, 25% diameter stenosis Left carotid system: Tortuous left common carotid artery widely patent. Atherosclerotic calcification proximal left internal carotid artery without significant stenosis. Vertebral arteries: Right vertebral dominant. Both vertebral arteries patent to the basilar without significant stenosis. Skeleton: Kyphoscoliosis in the cervical and thoracic spine. Multilevel degenerative changes in the cervical spine. Other neck: Negative for mass or adenopathy Upper chest: Calcified pleural plaques in the apices bilaterally with apical scarring bilaterally. No acute abnormality. Atherosclerotic aortic arch. Review of the MIP images confirms the above findings CTA HEAD FINDINGS Anterior circulation: Mild atherosclerotic disease in the cavernous carotid bilaterally without stenosis. Atherosclerotic calcification in the M1 and M2 segments bilaterally without significant stenosis. Anterior cerebral arteries patent bilaterally. Posterior circulation: Both vertebral arteries patent to the basilar. Basilar widely patent. PICA patent bilaterally. AICA, superior cerebellar, posterior cerebral arteries patent bilaterally. Mild atherosclerotic disease in the posterior cerebral arteries bilaterally with calcific atherosclerotic disease. Venous sinuses: Not well evaluated due to timing of injection. Anatomic variants: None Delayed phase: Not perform Review of the MIP images confirms the above findings IMPRESSION: No significant carotid or vertebral artery stenosis in the neck. Mild atherosclerotic disease in the middle cerebral arteries bilaterally and mild atherosclerotic disease in the posterior cerebral arteries  bilaterally. Negative for emergent large vessel occlusion. These results were called by telephone at the time of interpretation on 09/22/2017 at 10:49 am to Dr. Arther DamesSUSHANTH AROOR , who verbally acknowledged these results. Electronically Signed   By: Leonette Mostharles  Chestine Spore M.D.   On: 09/22/2017 10:49   Mr Brain Wo Contrast  Result Date: 09/22/2017 CLINICAL DATA:  LEFT-sided weakness, LEFT facial droop. History of carotid stenosis, atrial fibrillation, hypertension, hyperlipidemia and dementia. EXAM: MRI HEAD WITHOUT CONTRAST TECHNIQUE: Multiplanar, multiecho pulse sequences of the brain and surrounding structures were obtained without intravenous contrast. COMPARISON:  CT HEAD September 22, 2017. FINDINGS: INTRACRANIAL CONTENTS: Subcentimeter reduced diffusion RIGHT basal ganglia with low ADC values. Numerous chronic supra and infratentorial microhemorrhages, with confluent susceptibility artifact in basal ganglia and thalami associated with chronic hypertension. Moderate to severe parenchymal brain volume loss most conspicuous within mesial temporal lobes. No hydrocephalus. Confluent supratentorial white matter FLAIR T2 hyperintensities. Old bilateral basal ganglia and thalami lacunar infarcts. Prominent basal ganglia and thalami perivascular spaces associated with chronic small vessel ischemic changes. Old small bilateral cerebellar infarcts. No midline shift, mass effect or masses. No abnormal extra-axial fluid collections. VASCULAR: Normal major intracranial vascular flow voids present at skull base. SKULL AND UPPER CERVICAL SPINE: No abnormal sellar expansion. Generally bright bone marrow signal compatible with osteopenia. No suspicious calvarial bone marrow signal. Craniocervical junction maintained. SINUSES/ORBITS: The mastoid air-cells and included paranasal sinuses are well-aerated.The included ocular globes and orbital contents are non-suspicious. Status post bilateral ocular lens implants. OTHER: Patient is  edentulous. IMPRESSION: 1. Acute small RIGHT basal ganglia infarct. 2. Multiple old basal ganglia, thalami and cerebellar small infarcts. 3. Severe chronic small vessel ischemic changes. Extensive susceptibility artifact in a pattern associated with chronic hypertension. 4. Advanced parenchymal brain volume loss with disproportionate mesial temporal lobe atrophy associated with chronic small vessel ischemic changes. Electronically Signed   By: Awilda Metro M.D.   On: 09/22/2017 20:33   Ct Head Code Stroke Wo Contrast  Result Date: 09/22/2017 CLINICAL DATA:  Code stroke.  Left-sided weakness. EXAM: CT HEAD WITHOUT CONTRAST TECHNIQUE: Contiguous axial images were obtained from the base of the skull through the vertex without intravenous contrast. COMPARISON:  CT head 09/13/2014 FINDINGS: Brain: Advanced atrophy. Moderate to advanced chronic microvascular ischemic changes in the white matter. Progressive atrophy since 2016. Negative for acute infarct, hemorrhage, or mass lesion. Vascular: Atherosclerotic calcification extends into the middle cerebral arteries bilaterally. Negative for hyperdense vessel Skull: Negative Sinuses/Orbits: Mild mucosal edema paranasal sinuses. Bilateral cataract surgery Other: None ASPECTS (Alberta Stroke Program Early CT Score) - Ganglionic level infarction (caudate, lentiform nuclei, internal capsule, insula, M1-M3 cortex): 7 - Supraganglionic infarction (M4-M6 cortex): 3 Total score (0-10 with 10 being normal): 10 IMPRESSION: 1. No acute infarct identified. Extensive atrophy. Moderate chronic microvascular ischemia in the white matter 2. ASPECTS is 10 Electronically Signed   By: Marlan Palau M.D.   On: 09/22/2017 10:16   TTE - Normal LV size with mild LV hypertrophy. EF 60-65%. Normal RV   size and systolic function. Mild-moderate aortic stenosis (mild   by mean gradient, moderate visually and by calculated valve   area), moderate aortic regurgitation. The aortic valve  appeared   functionally bicuspid.   PHYSICAL EXAM  Temp:  [97.7 F (36.5 C)-98.6 F (37 C)] 98.6 F (37 C) (08/27 1600) Pulse Rate:  [65-80] 80 (08/27 0813) Resp:  [14-18] 14 (08/27 0813) BP: (103-160)/(47-65) 144/64 (08/27 0813) SpO2:  [97 %-99 %] 98 % (08/27 0813)  General - thin built lady, well developed, in no apparent distress.  Ophthalmologic - fundi not visualized due to noncooperation.  Cardiovascular - irregularly irregular heart rate and rhythm.  Mental Status -  Level of arousal and orientation to time,  place, and person were intact. Language including expression, naming, repetition, comprehension was assessed and found intact.  Mild dysarthria  Cranial Nerves II - XII - II - Visual field intact OU. III, IV, VI - Extraocular movements intact. V - Facial sensation intact bilaterally. VII - mild left facial droop. VIII - hard of hearing & vestibular intact bilaterally. X - Palate elevates symmetrically. XI - Chin turning & shoulder shrug intact bilaterally. XII - Tongue protrusion intact.  Motor Strength - The patient's strength was 4/5 right upper extremity, 0/5 left upper extremity, 0/5 left lower extremity, 0/5 right lower extremity proximal and 3/5 distally.  Bulk was normal and fasciculations were absent.   Motor Tone - Muscle tone was assessed at the neck and appendages and was normal.  Reflexes - The patient's reflexes were diminished in all extremities and she had no pathological reflexes.  Sensory - Light touch, temperature/pinprick were assessed and were symmetrical.    Coordination - The patient had normal movements in the right hand with no ataxia or dysmetria.  Tremor was absent.  Gait and Station - deferred.   ASSESSMENT/PLAN [name] is a 84 y.o. female with history of hypertension, hyperlipidemia, A. fib not on anticoagulation, CAD, TIA, GI bleeding on Eliquis, history of bilateral basal ganglia ICH, and traumatic SDH and SAH admitted  for left-sided weakness. No tPA given due to outside window.    Stroke:  right CR infarct likely secondary to small vessel disease source, however, given A. fib not on AC, cardioembolic source cannot be ruled out  Resultant left hemiplegia with wheelchair-bound baseline condition  MRI left CR small infarct  CTA head and neck unremarkable  2D Echo EF 60 to 65%  LDL 78  HgbA1c 5.5  Lovenox for VTE prophylaxis  No antithrombotic prior to admission, now on aspirin 81 mg daily.  Given history of bilateral BG ICH, traumatic SDH/SAH, GI bleeding on low-dose Eliquis, patient was deemed not a good candidate for anticoagulation.  Continue aspirin on discharge.  Patient counseled to be compliant with her antithrombotic medications  Ongoing aggressive stroke risk factor management  Therapy recommendations: SNF  Disposition: Pending  Persistent atrial for ablation  Was on Eliquis 2.5 twice daily in 09/2014, however developed GI bleeding and generalized weakness, Eliquis was discontinued  Not a good candidate for [facility]  Continue aspirin on discharge  Rate controlled  History of TIA  09/2014 with left facial droop, left more than right weakness, slurred speech lasting for 15 to 20 minutes -consider related to atrial for ablation not on AC, started Eliquis 2.5 twice daily but later discontinued due to GI bleeding  History of ICH  05/2014 presented after a fall.  CT showed bilateral BG ICH and traumatic parafalcine SDH and left temporal SAH.  CT repeat stable.  Carotid Doppler, TTE, A1c and LDL all unremarkable.  Discharged to SNF  Hypertension Stable Permissive hypertension (OK if <180/105) for 24-48 hours post stroke and then gradually normalized within 5-7 days.  Long term BP goal normotensive  Hyperlipidemia  Home meds: None  LDL 78, goal < 70  Now on low-dose pravastatin  Continue statin at discharge  Other Stroke Risk Factors  Advanced age  Coronary artery  disease  Other Active Problems  Anemia, nonspecific, hemoglobin 10.8-10.5  Hospital day # 1  Neurology will sign off. Please call with questions. Pt will follow up with stroke clinic NP at [facility] in about 4 weeks. Thanks for the consult.   Marvel Plan,  MD PhD Stroke Neurology 09/23/2017 5:37 PM    To contact Stroke Continuity provider, please refer to [email or web address]. After hours, contact General Neurology

## 2017-09-24 DIAGNOSIS — I10 Essential (primary) hypertension: Secondary | ICD-10-CM

## 2017-09-24 DIAGNOSIS — I63311 Cerebral infarction due to thrombosis of right middle cerebral artery: Secondary | ICD-10-CM

## 2017-09-24 LAB — BASIC METABOLIC PANEL
Anion gap: 8 (ref 5–15)
BUN: 8 mg/dL (ref 8–23)
CALCIUM: 8.4 mg/dL — AB (ref 8.9–10.3)
CO2: 27 mmol/L (ref 22–32)
CREATININE: 0.64 mg/dL (ref 0.44–1.00)
Chloride: 105 mmol/L (ref 98–111)
GFR calc Af Amer: >60 mL/min (ref >60)
GFR calc non Af Amer: >60 mL/min (ref >60)
GLUCOSE: 113 mg/dL — AB (ref 70–99)
Potassium: 4 mmol/L (ref 3.5–5.1)
SODIUM: 140 mmol/L (ref 135–145)

## 2017-09-24 LAB — MAGNESIUM: Magnesium: 1.8 mg/dL (ref 1.7–2.4)

## 2017-09-24 MED ORDER — ASPIRIN 81 MG PO CHEW: 81.0000 mg | CHEWABLE_TABLET | Freq: Every day | ORAL | Status: AC

## 2017-09-24 MED ORDER — PRAVASTATIN SODIUM 20 MG PO TABS: 20.0000 mg | ORAL_TABLET | Freq: Every day | ORAL | Status: AC

## 2017-09-24 NOTE — Clinical Social Work Note (Signed)
Clinical Social Work Assessment  Patient Details  Name: [name] MRN: [id number] Date of Birth: 06/26/1920  Date of referral:  09/24/17               Reason for consult:  Facility Placement                Permission sought to share information with:  Facility Sport and exercise psychologist, Family Supports Permission granted to share information::  Yes, Verbal Permission Granted  Name::     Banker::  Miquel Dunn  Relationship::  Daughter  Contact Information:     Housing/Transportation Living arrangements for the past 2 months:  [place] of Information:  Patient, Scientist, water quality, Adult Children Patient Interpreter Needed:  None Criminal Activity/Legal Involvement Pertinent to Current Situation/Hospitalization:  No - Comment as needed Significant Relationships:  Adult Children Lives with:  Self, Facility Resident Do you feel safe going back to the place where you live?  Yes Need for family participation in patient care:  Yes (Comment)  Care giving concerns:  Patient from [facility] and plan is to return to [place]. Patient's daughter has some concerns about care at [facility], but she will be addressing them with administration upon return.   Social Worker assessment / plan:  CSW alerted by MD that patient is stable for discharge back to SNF. CSW met with patient and daughter at bedside to confirm plan to return to SNF. CSW discussed concerns with patient's daughter and encouraged her to address them with administration at the SNF. CSW to coordinate return to SNF.  Employment status:  Retired Nurse, adult PT Recommendations:  [place] / Referral to community resources:  [place]  Patient/Family's Response to care:  Patient and daughter are agreeable to return to SNF.   Patient/Family's Understanding of and Emotional Response to Diagnosis, Current Treatment, and Prognosis:  Patient's daughter discussed  concern about the nursing care when the family first noticed the signs of a stroke, and the nursing staff at [facility] did not respond quickly to address the situation. Patient's daughter said she will be addressing it with the administration when she gets back. Patient's daughter discussed how the care had been really excellent when she first moved in but over the past year with the new ownership the care has been slowly declining.  Emotional Assessment Appearance:  Appears stated age Attitude/Demeanor/Rapport:  Engaged Affect (typically observed):  Pleasant Orientation:  Oriented to Self, Oriented to Place Alcohol / Substance use:  Not Applicable Psych involvement (Current and /or in the community):  No (Comment)  Discharge Needs  Concerns to be addressed:  Care Coordination Readmission within the last 30 days:  No Current discharge risk:  Physical Impairment, Dependent with Mobility Barriers to Discharge:  No Barriers Identified   Geralynn Ochs, LCSW 09/24/2017, 12:02 PM

## 2017-09-24 NOTE — Evaluation (Signed)
Speech Language Pathology Evaluation Patient Details Name: [name] MRN: [id number] DOB: September 28, 1920 Today's Date: 09/24/2017 Time: 4098-1191 SLP Time Calculation (min) (ACUTE ONLY): 25 min  Problem List:  Patient Active Problem List   Diagnosis Date Noted  . Stroke (HCC) 09/22/2017  . Palliative care encounter   . Acute lower UTI   . Protein-calorie malnutrition, severe 03/21/2016  . SIRS (systemic inflammatory response syndrome) (HCC) 03/19/2016  . UTI (urinary tract infection) 03/19/2016  . Pain in joint, shoulder region 11/02/2014  . Anemia due to chronic blood loss 11/02/2014  . Nontraumatic subcortical hemorrhage of right cerebral hemisphere (HCC) 11/02/2014  . Nontraumatic subcortical hemorrhage of cerebral hemisphere (HCC) 08/23/2014  . SAH (subarachnoid hemorrhage) (HCC) 08/23/2014  . Paroxysmal atrial fibrillation (HCC) 08/23/2014  . Essential hypertension 08/23/2014  . Hyperlipidemia 08/23/2014  . Mild TBI (HCC) 06/21/2014  . Traumatic brain injury (HCC)   . SDH (subdural hematoma) (HCC) 06/16/2014  . Nontraumatic hemorrhage of cerebral hemisphere (HCC)   . CAD (coronary artery disease) of artery bypass graft 06/15/2012  . Carotid bruit present 06/15/2012  . Raynaud phenomenon 02/08/2011  . PAF (paroxysmal atrial fibrillation) (HCC) 10/09/2010  . High risk medication use 10/09/2010  . Aortic valve disease 10/09/2010  . HYPERTENSION 06/10/2007  . ALLERGIC RHINITIS 06/10/2007  . EMPHYSEMA 06/10/2007  . DYSPNEA 06/10/2007  . COUGH 06/10/2007   Past Medical History:  Past Medical History:  Diagnosis Date  . Aortic insufficiency Feb 2010   Mild to moderate per echo with normal EF  . Arthritis   . Atrial fibrillation with rapid ventricular response [facility]) Feb 2010  . CAD (coronary artery disease)    2010.  60% RCA, 60 - 70% LAD  . Carotid stenosis    40 - 59% bilateral  . Compression fracture   . Dementia   . Flash pulmonary edema [facility]) Feb 2010   Due to  atrial fib with RVR  . Hyperlipidemia   . Hypertension   . IBS (irritable bowel syndrome)   . PUD (peptic ulcer disease)   . Raynaud phenomenon   . Stroke (HCC)   . Vitamin D deficiency    Past Surgical History:  Past Surgical History:  Procedure Laterality Date  . APPENDECTOMY    . CARDIAC CATHETERIZATION  03/11/2008   EF 45%  . TRANSTHORACIC ECHOCARDIOGRAM  03/06/2008   EF 55%   HPI:  [name] is a delightful 84 y.o. female with medical history significant for Hypertension, hemorrhagic CVA, GI bleed, hyperlipidemia, carotid stenosis, A. Fib not on anticoagulation presents to the emergency Department chief complaint left-sided weakness. Upon arrival patient was able to name objects all commands and talk with dysarthria. Patient is mostly wheelchair bound. Family reports gradual decline in appetite with unintentional weight loss over the last year.Pt reports she has lower dentures but does not use them to eat.  SLP follow up to assure po tolerance of diet and for speech/language evaluation.    Assessment / Plan / Recommendation Clinical Impression  Patient presents with mild dysarthria, trigeminal and hypoglossal nerve involvment and orientation deficits.  No family present to establish her baseline especially important given h/o dementia.  Pt is able to verbalize her needs/wants without deficits however.   Pt reports she has lower dentures and wears them for her "smile".   Willl follow up with family to help determine baseline and address cognitive/swallowing goals.  Recommend follow up at [facility] for dysphagia management to assure pt is not needlessly limited.  SLP Assessment  SLP Recommendation/Assessment: Patient needs continued Speech Lanaguage Pathology Services SLP Visit Diagnosis: Cognitive communication deficit (R41.841);Dysarthria and anarthria (R47.1)    Follow Up Recommendations  Skilled Nursing facility    Frequency and Duration min 1 x/week  1 week      SLP  Evaluation Cognition  Overall Cognitive Status: No family/caregiver present to determine baseline cognitive functioning Arousal/Alertness: Awake/alert Orientation Level: Oriented to person;Disoriented to situation;Disoriented to time;Oriented to place Attention: Sustained Sustained Attention: Appears intact Memory: Impaired(did not recall she had a stroke) Awareness: Impaired Awareness Impairment: Intellectual impairment(pt states "I am leaning" and desires to be moved to midline indicating awareness) Problem Solving: Appears intact(for basic)       Comprehension  Auditory Comprehension Overall Auditory Comprehension: Appears within functional limits for tasks assessed Yes/No Questions: Not tested Commands: Within Functional Limits(for basic directions) Conversation: Complex Visual Recognition/Discrimination Discrimination: Not tested Reading Comprehension Reading Status: Not tested    Expression Expression Primary Mode of Expression: Verbal Verbal Expression Overall Verbal Expression: Appears within functional limits for tasks assessed Initiation: No impairment Repetition: No impairment Naming: Not tested Pragmatics: No impairment Written Expression Dominant Hand: Right Written Expression: Not tested   Oral / Motor  Oral Motor/Sensory Function Overall Oral Motor/Sensory Function: Mild impairment Facial ROM: Reduced left Facial Symmetry: Abnormal symmetry left Facial Sensation: Reduced left Lingual ROM: Other (Comment)(decreased) Lingual Symmetry: Abnormal symmetry left Lingual Strength: Reduced Lingual Sensation: Other (Comment) Velum: Within Functional Limits Motor Speech Overall Motor Speech: Impaired Respiration: Impaired Phonation: Low vocal intensity Resonance: Within functional limits Articulation: Impaired Level of Impairment: (multisyllabic words) Intelligibility: Intelligible Motor Planning: Witnin functional limits Motor Speech Errors: Not  applicable Interfering Components: Premorbid status;Inadequate dentition   GO                    [name], April Gibb Ann 09/24/2017, 9:25 AM  Donavan Burnetamara Nyana Haren, MS [facility] SLP [phone]

## 2017-09-24 NOTE — Plan of Care (Signed)
1 assist with adls

## 2017-09-24 NOTE — Progress Notes (Signed)
[facility] Anticipated discharge date: 09/24/17 Family notified: Yes, daughter Transportation by: [facility]  Report #: [id number], Room 201  CSW signing off.  Blenda Nicelylizabeth Alexy Bringle LCSW [phone]

## 2017-09-24 NOTE — Progress Notes (Signed)
  Speech Language Pathology Treatment: Dysphagia  Patient Details Name: [name] MRN: [id number] DOB: 10/18/1920 Today's Date: 09/24/2017 Time: [id number] SLP Time Calculation (min) (ACUTE ONLY): 25 min  Assessment / Plan / Recommendation Clinical Impression  Pt seen to assess po tolerance and review aspiration precautions.  Pt consuming breakfast and having difficulty orally transiting/managing solids requiring mastication.  She is dysarthric and admits her voice is weak.  Pt reports sensation of food lodging in pharynx and demonstrated audible swallow with cough post-swallow of solids x2.  Per discussion with pt when given options re: diet - she advised she desired pureed diet to ease swallowing.  Suspect this may improve intake as pt stopped eating her scrambled eggs, sausage and potatoes - only consuming yogurt, grits and applesauce for breakfast.  Pt is demonstrating good problem solving skills re: her dysphagia.  Will follow for dysphagia management, indication for instrumental study.   Educated pt to plan, precautions using teach back.    HPI HPI: April Skeetersula W Strite is a delightful 84 y.o. female with medical history significant for Hypertension, hemorrhagic CVA, GI bleed, hyperlipidemia, carotid stenosis, A. Fib not on anticoagulation presents to the emergency Department chief complaint left-sided weakness. Upon arrival patient was able to name objects all commands and talk with dysarthria. Patient is mostly wheelchair bound. Family reports gradual decline in appetite with unintentional weight loss over the last year.Pt reports she has lower dentures but does not use them to eat.  SLP follow up to assure po tolerance of diet.        SLP Plan  Continue with current plan of care       Recommendations  Diet recommendations: Dysphagia 1 (puree);Thin liquid Liquids provided via: Cup;Straw Medication Administration: Whole meds with puree Supervision: Patient able to self feed Compensations:  Minimize environmental distractions;Slow rate;Small sips/bites(consume liquids t/o meal) Postural Changes and/or Swallow Maneuvers: Seated upright 90 degrees;Upright 30-60 min after meal                Oral Care Recommendations: Oral care BID Follow up Recommendations: Skilled Nursing facility SLP Visit Diagnosis: Dysphagia, oropharyngeal phase (R13.12);Dysphagia, pharyngoesophageal phase (R13.14) Plan: Continue with current plan of care       GO                Chales AbrahamsKimball, Leeza Heiner Ann 09/24/2017, 8:56 AM  Donavan Burnetamara Xoie Kreuser, MS [facility] SLP [phone]

## 2017-09-24 NOTE — Discharge Summary (Signed)
Physician Discharge Summary  [name] ZOX:[id number] DOB: [birth year] DOA: 09/22/2017  PCP: Rodrigo Ran, MD  Admit date: 09/22/2017 Discharge date: 09/24/2017  Time spent: 45 minutes  Recommendations for Outpatient Follow-up:  -Will be discharged back to skilled nursing facility today. -It would be appropriate to continue to clarify goals of care for this patient as she is elderly and with significant physical and medical comorbidities.  Discharge Diagnoses:  Principal Problem:   Stroke [facility]) Active Problems:   PAF (paroxysmal atrial fibrillation) (HCC)   Aortic valve disease   CAD (coronary artery disease) of artery bypass graft   Essential hypertension   Hyperlipidemia   Discharge Condition: Guarded, stable  Filed Weights   09/22/17 1106  Weight: 44.1 kg    History of present illness:  As per Toya Smothers, NP on 8/26: [name] is a delightful 84 y.o. female with medical history significant for Hypertension, hemorrhagic CVA, GI bleed, hyperlipidemia, carotid stenosis, A. Fib not on anticoagulation presents to the emergency Department chief complaint left-sided weakness. [facility] are asked to admit for stroke workup  Information is obtained from the patient and the chart and the daughter who is at the bedside. Daughter states she saw the patient 3 days ago and thought her "mouth was more drawn then usual". He states she told that nursing home staff who "dismissed it". 2 days ago a different family member went to the facility to visit and believed they noticed she wasn't moving her left arm as much as usual. Again the staff was notified. This morning patient complained of not being able to use her left hand and staff noted some slurred speech. EMS was called. Chart review indicates upon arrival patient was able to name objects all commands and talk with dysarthria. She denies headache dizziness syncope or near-syncope. She denies chest pain palpitations shortness  of breath. No reports of any recent illness fever chills cough. Patient is mostly wheelchair bound. Family reports gradual decline in appetite with unintentional weight loss over the last year.  Hospital Course:   Acute small right basal ganglia infarct -She continues to have significant left-sided deficit. -PT/OT recommend SNF care. -Source of CVA is likely to be small vessel disease, however given atrial fibrillation not on anticoagulation a cardioembolic source cannot be ruled out. -LDL is 78. -CTA head and neck is unremarkable. -2D echo showed this injection fraction of 60 to 65%. -Hemoglobin A1c was 5.5. -She is not on any anticoagulation, now on aspirin 81 mg daily.  She has a history of bilateral intracerebral hemorrhage, traumatic subarachnoid hemorrhage and GI bleeding on low-dose Eliquis, because of that she has been deemed to not be a good candidate for systemic anticoagulation.  We will continue aspirin on discharge.  Persistent atrial fibrillation -Rate controlled, continue aspirin, not good candidate for anticoagulation as described above.  Hyperlipidemia -LDL is 78, on low-dose pravastatin.  Hypertension -Pressure between 101 50. -Given acute CVA we have allowed some permissive hypertension and her Cozaar has been discontinued. -Would consider restarting Cozaar over the course of the next 5 days if blood pressure remains elevated.  Procedures:  As above  Consultations:  Neurology  Discharge Instructions  Discharge Instructions    Ambulatory referral to Neurology   Complete by:  As directed    Follow up with stroke clinic NP (Jessica Vanschaick or Darrol Angel, if both not available, consider Manson Allan, or Ahern) at [facility] in about 4 weeks. Thanks.   Diet - low sodium  heart healthy   Complete by:  As directed    Increase activity slowly   Complete by:  As directed      Allergies as of 09/24/2017      Reactions   Aspirin Other (See Comments)    Zoledronic Acid Other (See Comments)   REACTION: Intolerance   Allergen [antipyrine-benzocaine]       Medication List    STOP taking these medications   losartan 100 MG tablet Commonly known as:  COZAAR     TAKE these medications   acetaminophen 325 MG tablet Commonly known as:  TYLENOL Take 325 mg by mouth every 4 (four) hours as needed for moderate pain.   amiodarone 200 MG tablet Commonly known as:  PACERONE TAKE 1/2 TABLET BY MOUTH EVERY DAY What changed:    how much to take  how to take this  when to take this  additional instructions   aspirin 81 MG chewable tablet Chew 1 tablet (81 mg total) by mouth daily. Start taking on:  09/25/2017   citalopram 20 MG tablet Commonly known as:  CELEXA Take 20 mg by mouth daily.   pantoprazole 40 MG tablet Commonly known as:  PROTONIX Take 1 tablet (40 mg total) by mouth daily.   pravastatin 20 MG tablet Commonly known as:  PRAVACHOL Take 1 tablet (20 mg total) by mouth daily at 6 PM.   sennosides-docusate sodium 8.6-50 MG tablet Commonly known as:  SENOKOT-S Take 1 tablet by mouth at bedtime as needed for constipation. What changed:  how much to take   traZODone 50 MG tablet Commonly known as:  DESYREL Take 25 mg by mouth at bedtime.   Vitamin D (Ergocalciferol) 50000 units Caps capsule Commonly known as:  DRISDOL Take 50,000 Units by mouth every Wednesday.      Allergies  Allergen Reactions  . Aspirin Other (See Comments)  . Zoledronic Acid Other (See Comments)    REACTION: Intolerance  . Allergen [Antipyrine-Benzocaine]     Contact information for follow-up providers    [facility]. Schedule an appointment as soon as possible for a visit in 4 week(s).   Specialty:  Radiology Contact information: [address] [place] [address] [phone]       Rodrigo Ran, MD Follow up.   Specialty:  Internal Medicine Contact information: [address] [place] [address] [phone]            Contact information for after-discharge care    Destination    [facility] .   Service:  Skilled Nursing Contact information: [address] [place] [address] [phone]                   The results of significant diagnostics from this hospitalization (including imaging, microbiology, ancillary and laboratory) are listed below for reference.    Significant Diagnostic Studies: Ct Angio Head W Or Wo Contrast  Result Date: 09/22/2017 CLINICAL DATA:  Focal neuro deficit greater than 6 hours. Left-sided weakness. EXAM: CT ANGIOGRAPHY HEAD AND NECK TECHNIQUE: Multidetector CT imaging of the head and neck was performed using the standard protocol during bolus administration of intravenous contrast. Multiplanar CT image reconstructions and MIPs were obtained to evaluate the vascular anatomy. Carotid stenosis measurements (when applicable) are obtained utilizing NASCET criteria, using the distal internal carotid diameter as the denominator. CONTRAST:  50mL ISOVUE-370 IOPAMIDOL (ISOVUE-370) INJECTION 76% COMPARISON:  CT head 09/22/2017 FINDINGS: CTA NECK FINDINGS Aortic  arch: Moderate atherosclerotic disease aortic arch. Atherosclerotic disease proximal great vessels. Mild narrowing of the proximal subclavian artery bilaterally. Right carotid system: Tortuous common carotid artery widely patent. Atherosclerotic calcification proximal right internal carotid artery, 25% diameter stenosis Left carotid system: Tortuous left common carotid artery widely patent. Atherosclerotic calcification proximal left internal carotid artery without significant stenosis. Vertebral arteries: Right vertebral dominant. Both vertebral arteries patent to the basilar without significant stenosis. Skeleton: Kyphoscoliosis in the cervical and thoracic spine. Multilevel degenerative changes in the cervical spine. Other neck:  Negative for mass or adenopathy Upper chest: Calcified pleural plaques in the apices bilaterally with apical scarring bilaterally. No acute abnormality. Atherosclerotic aortic arch. Review of the MIP images confirms the above findings CTA HEAD FINDINGS Anterior circulation: Mild atherosclerotic disease in the cavernous carotid bilaterally without stenosis. Atherosclerotic calcification in the M1 and M2 segments bilaterally without significant stenosis. Anterior cerebral arteries patent bilaterally. Posterior circulation: Both vertebral arteries patent to the basilar. Basilar widely patent. PICA patent bilaterally. AICA, superior cerebellar, posterior cerebral arteries patent bilaterally. Mild atherosclerotic disease in the posterior cerebral arteries bilaterally with calcific atherosclerotic disease. Venous sinuses: Not well evaluated due to timing of injection. Anatomic variants: None Delayed phase: Not perform Review of the MIP images confirms the above findings IMPRESSION: No significant carotid or vertebral artery stenosis in the neck. Mild atherosclerotic disease in the middle cerebral arteries bilaterally and mild atherosclerotic disease in the posterior cerebral arteries bilaterally. Negative for emergent large vessel occlusion. These results were called by telephone at the time of interpretation on 09/22/2017 at 10:49 am to Dr. Arther Dames , who verbally acknowledged these results. Electronically Signed   By: Marlan Palau M.D.   On: 09/22/2017 10:49   Ct Angio Neck W Or Wo Contrast  Result Date: 09/22/2017 CLINICAL DATA:  Focal neuro deficit greater than 6 hours. Left-sided weakness. EXAM: CT ANGIOGRAPHY HEAD AND NECK TECHNIQUE: Multidetector CT imaging of the head and neck was performed using the standard protocol during bolus administration of intravenous contrast. Multiplanar CT image reconstructions and MIPs were obtained to evaluate the vascular anatomy. Carotid stenosis measurements (when  applicable) are obtained utilizing NASCET criteria, using the distal internal carotid diameter as the denominator. CONTRAST:  50mL ISOVUE-370 IOPAMIDOL (ISOVUE-370) INJECTION 76% COMPARISON:  CT head 09/22/2017 FINDINGS: CTA NECK FINDINGS Aortic arch: Moderate atherosclerotic disease aortic arch. Atherosclerotic disease proximal great vessels. Mild narrowing of the proximal subclavian artery bilaterally. Right carotid system: Tortuous common carotid artery widely patent. Atherosclerotic calcification proximal right internal carotid artery, 25% diameter stenosis Left carotid system: Tortuous left common carotid artery widely patent. Atherosclerotic calcification proximal left internal carotid artery without significant stenosis. Vertebral arteries: Right vertebral dominant. Both vertebral arteries patent to the basilar without significant stenosis. Skeleton: Kyphoscoliosis in the cervical and thoracic spine. Multilevel degenerative changes in the cervical spine. Other neck: Negative for mass or adenopathy Upper chest: Calcified pleural plaques in the apices bilaterally with apical scarring bilaterally. No acute abnormality. Atherosclerotic aortic arch. Review of the MIP images confirms the above findings CTA HEAD FINDINGS Anterior circulation: Mild atherosclerotic disease in the cavernous carotid bilaterally without stenosis. Atherosclerotic calcification in the M1 and M2 segments bilaterally without significant stenosis. Anterior cerebral arteries patent bilaterally. Posterior circulation: Both vertebral arteries patent to the basilar. Basilar widely patent. PICA patent bilaterally. AICA, superior cerebellar, posterior cerebral arteries patent bilaterally. Mild atherosclerotic disease in the posterior cerebral arteries bilaterally with calcific atherosclerotic disease. Venous sinuses: Not well evaluated due to timing of injection. Anatomic variants:  None Delayed phase: Not perform Review of the MIP images confirms  the above findings IMPRESSION: No significant carotid or vertebral artery stenosis in the neck. Mild atherosclerotic disease in the middle cerebral arteries bilaterally and mild atherosclerotic disease in the posterior cerebral arteries bilaterally. Negative for emergent large vessel occlusion. These results were called by telephone at the time of interpretation on 09/22/2017 at 10:49 am to Dr. Arther Dames , who verbally acknowledged these results. Electronically Signed   By: Marlan Palau M.D.   On: 09/22/2017 10:49   Mr Brain Wo Contrast  Result Date: 09/22/2017 CLINICAL DATA:  LEFT-sided weakness, LEFT facial droop. History of carotid stenosis, atrial fibrillation, hypertension, hyperlipidemia and dementia. EXAM: MRI HEAD WITHOUT CONTRAST TECHNIQUE: Multiplanar, multiecho pulse sequences of the brain and surrounding structures were obtained without intravenous contrast. COMPARISON:  CT HEAD September 22, 2017. FINDINGS: INTRACRANIAL CONTENTS: Subcentimeter reduced diffusion RIGHT basal ganglia with low ADC values. Numerous chronic supra and infratentorial microhemorrhages, with confluent susceptibility artifact in basal ganglia and thalami associated with chronic hypertension. Moderate to severe parenchymal brain volume loss most conspicuous within mesial temporal lobes. No hydrocephalus. Confluent supratentorial white matter FLAIR T2 hyperintensities. Old bilateral basal ganglia and thalami lacunar infarcts. Prominent basal ganglia and thalami perivascular spaces associated with chronic small vessel ischemic changes. Old small bilateral cerebellar infarcts. No midline shift, mass effect or masses. No abnormal extra-axial fluid collections. VASCULAR: Normal major intracranial vascular flow voids present at skull base. SKULL AND UPPER CERVICAL SPINE: No abnormal sellar expansion. Generally bright bone marrow signal compatible with osteopenia. No suspicious calvarial bone marrow signal. Craniocervical junction  maintained. SINUSES/ORBITS: The mastoid air-cells and included paranasal sinuses are well-aerated.The included ocular globes and orbital contents are non-suspicious. Status post bilateral ocular lens implants. OTHER: Patient is edentulous. IMPRESSION: 1. Acute small RIGHT basal ganglia infarct. 2. Multiple old basal ganglia, thalami and cerebellar small infarcts. 3. Severe chronic small vessel ischemic changes. Extensive susceptibility artifact in a pattern associated with chronic hypertension. 4. Advanced parenchymal brain volume loss with disproportionate mesial temporal lobe atrophy associated with chronic small vessel ischemic changes. Electronically Signed   By: Awilda Metro M.D.   On: 09/22/2017 20:33   Ct Head Code Stroke Wo Contrast  Result Date: 09/22/2017 CLINICAL DATA:  Code stroke.  Left-sided weakness. EXAM: CT HEAD WITHOUT CONTRAST TECHNIQUE: Contiguous axial images were obtained from the base of the skull through the vertex without intravenous contrast. COMPARISON:  CT head 09/13/2014 FINDINGS: Brain: Advanced atrophy. Moderate to advanced chronic microvascular ischemic changes in the white matter. Progressive atrophy since 2016. Negative for acute infarct, hemorrhage, or mass lesion. Vascular: Atherosclerotic calcification extends into the middle cerebral arteries bilaterally. Negative for hyperdense vessel Skull: Negative Sinuses/Orbits: Mild mucosal edema paranasal sinuses. Bilateral cataract surgery Other: None ASPECTS ([place] Stroke Program Early CT Score) - Ganglionic level infarction (caudate, lentiform nuclei, internal capsule, insula, M1-M3 cortex): 7 - Supraganglionic infarction (M4-M6 cortex): 3 Total score (0-10 with 10 being normal): 10 IMPRESSION: 1. No acute infarct identified. Extensive atrophy. Moderate chronic microvascular ischemia in the white matter 2. ASPECTS is 10 Electronically Signed   By: Marlan Palau M.D.   On: 09/22/2017 10:16    Microbiology: No results  found for this or any previous visit (from the past 240 hour(s)).   Labs: Basic Metabolic Panel: Recent Labs  Lab 09/22/17 0958 09/22/17 1001 09/24/17 0433  NA 139 136 140  K 4.3 4.2 4.0  CL 103 101 105  CO2 28  --  27  GLUCOSE 101* 98 113*  BUN 11 14 8   CREATININE 0.68 0.70 0.64  CALCIUM 8.8*  --  8.4*  MG  --   --  1.8   Liver Function Tests: Recent Labs  Lab 09/22/17 0958  AST 17  ALT 10  ALKPHOS 93  BILITOT 0.7  PROT 6.1*  ALBUMIN 3.0*   No results for input(s): LIPASE, AMYLASE in the last 168 hours. No results for input(s): AMMONIA in the last 168 hours. CBC: Recent Labs  Lab 09/22/17 0958 09/22/17 1001  WBC 9.0  --   NEUTROABS 5.7  --   HGB 10.8* 10.5*  HCT 34.4* 31.0*  MCV 93.2  --   PLT 210  --    Cardiac Enzymes: No results for input(s): CKTOTAL, CKMB, CKMBINDEX, TROPONINI in the last 168 hours. BNP: BNP (last 3 results) No results for input(s): BNP in the last 8760 hours.  ProBNP (last 3 results) No results for input(s): PROBNP in the last 8760 hours.  CBG: Recent Labs  Lab 09/22/17 0957  GLUCAP 97       Signed:  Chaya JanEstela Hernandez Acosta  [facility] Pager: [phone] 09/24/2017, 10:51 AM

## 2017-09-24 NOTE — NC FL2 (Signed)
[place] MEDICAID FL2 LEVEL OF CARE SCREENING TOOL     IDENTIFICATION  Patient Name: [name] Birthdate: Jul 08, 1920 Sex: female Admission Date (Current Location): 09/22/2017  [facility] and [place] Number:  Producer, television/film/video and Address:  [facility] [place], [address], [place], [place] [address]      Provider Number: [id number]  Attending Physician Name and Address:  Philip Aspen, Minerva Ends*  Relative Name and Phone Number:       Current Level of Care: SNF Recommended Level of Care: Skilled Nursing Facility Prior Approval Number:    Date Approved/Denied:   PASRR Number:    Discharge Plan: SNF    Current Diagnoses: Patient Active Problem List   Diagnosis Date Noted  . Stroke (HCC) 09/22/2017  . Palliative care encounter   . Acute lower UTI   . Protein-calorie malnutrition, severe 03/21/2016  . SIRS (systemic inflammatory response syndrome) (HCC) 03/19/2016  . UTI (urinary tract infection) 03/19/2016  . Pain in joint, shoulder region 11/02/2014  . Anemia due to chronic blood loss 11/02/2014  . Nontraumatic subcortical hemorrhage of right cerebral hemisphere (HCC) 11/02/2014  . Nontraumatic subcortical hemorrhage of cerebral hemisphere (HCC) 08/23/2014  . SAH (subarachnoid hemorrhage) (HCC) 08/23/2014  . Paroxysmal atrial fibrillation (HCC) 08/23/2014  . Essential hypertension 08/23/2014  . Hyperlipidemia 08/23/2014  . Mild TBI (HCC) 06/21/2014  . Traumatic brain injury (HCC)   . SDH (subdural hematoma) (HCC) 06/16/2014  . Nontraumatic hemorrhage of cerebral hemisphere (HCC)   . CAD (coronary artery disease) of artery bypass graft 06/15/2012  . Carotid bruit present 06/15/2012  . Raynaud phenomenon 02/08/2011  . PAF (paroxysmal atrial fibrillation) (HCC) 10/09/2010  . High risk medication use 10/09/2010  . Aortic valve disease 10/09/2010  . HYPERTENSION 06/10/2007  . ALLERGIC RHINITIS 06/10/2007  . EMPHYSEMA 06/10/2007  . DYSPNEA  06/10/2007  . COUGH 06/10/2007    Orientation RESPIRATION BLADDER Height & Weight     Self, Place  Normal Incontinent Weight: 97 lb 3.6 oz (44.1 kg) Height:     BEHAVIORAL SYMPTOMS/MOOD NEUROLOGICAL BOWEL NUTRITION STATUS      Continent Diet(regular)  AMBULATORY STATUS COMMUNICATION OF NEEDS Skin   Extensive Assist Verbally Normal                       Personal Care Assistance Level of Assistance  Bathing, Feeding, Dressing Bathing Assistance: Maximum assistance Feeding assistance: Independent Dressing Assistance: Maximum assistance     Functional Limitations Info  Sight, Hearing, Speech Sight Info: Adequate Hearing Info: Adequate Speech Info: Adequate    SPECIAL CARE FACTORS FREQUENCY  PT (By licensed PT), OT (By licensed OT), Speech therapy     PT Frequency: 5x/wk OT Frequency: 5x/wk     Speech Therapy Frequency: 5x/wk      Contractures Contractures Info: Not present    Additional Factors Info  Code Status, Allergies, Psychotropic Code Status Info: Partial Allergies Info: Aspirin, Zoledronic Acid, Allergen Antipyrine-benzocaine Psychotropic Info: Celexa 20mg  daily         Current Medications (09/24/2017):  This is the current hospital active medication list Current Facility-Administered Medications  Medication Dose Route Frequency Provider Last Rate Last Dose  . acetaminophen (TYLENOL) tablet 650 mg  650 mg Oral Q4H PRN Gwenyth Bender, NP       Or  . acetaminophen (TYLENOL) solution 650 mg  650 mg Per Tube Q4H PRN Black, Lesle Chris, NP       Or  . acetaminophen (  TYLENOL) suppository 650 mg  650 mg Rectal Q4H PRN Gwenyth BenderBlack, Karen M, NP      . amiodarone (PACERONE) tablet 100 mg  100 mg Oral Daily Gwenyth BenderBlack, Karen M, NP   100 mg at 09/24/17 0818  . aspirin chewable tablet 81 mg  81 mg Oral Daily Jonah BlueYates, Jennifer, MD   81 mg at 09/24/17 0818  . citalopram (CELEXA) tablet 20 mg  20 mg Oral Daily Gwenyth BenderBlack, Karen M, NP   20 mg at 09/24/17 0818  . dextrose 5 % in lactated  ringers infusion   Intravenous Continuous Albertine GratesXu, Fang, MD 75 mL/hr at 09/24/17 0500    . enoxaparin (LOVENOX) injection 30 mg  30 mg Subcutaneous Q24H Gwenyth BenderBlack, Karen M, NP   30 mg at 09/23/17 2121  . pantoprazole (PROTONIX) EC tablet 40 mg  40 mg Oral Daily Gwenyth BenderBlack, Karen M, NP   40 mg at 09/24/17 0818  . pravastatin (PRAVACHOL) tablet 20 mg  20 mg Oral q1800 Marvel PlanXu, Jindong, MD   20 mg at 09/23/17 1752  . senna-docusate (Senokot-S) tablet 2 tablet  2 tablet Oral QHS PRN Jonah BlueYates, Jennifer, MD      . traZODone (DESYREL) tablet 25 mg  25 mg Oral QHS Gwenyth BenderBlack, Karen M, NP   25 mg at 09/23/17 2120  . Vitamin D (Ergocalciferol) (DRISDOL) capsule 50,000 Units  50,000 Units Oral Q Wed Black, Karen M, NP   50,000 Units at 09/24/17 [id number]     Discharge Medications: Please see discharge summary for a list of discharge medications.  Relevant Imaging Results:  Relevant Lab Results:   Additional Information SS#: [id number]  Baldemar LenisElizabeth M Mee Macdonnell, LCSW

## 2017-09-24 NOTE — Progress Notes (Signed)
ATTEMPTED TO GIVE UPPDATE REORT TO [facility], HOWEVER, NO CLINICAL STAFF AVAILABLE AFTER INITIAL CONTACT WAS ESTABLISHED.

## 2017-10-28 DEATH — deceased

## 2017-11-04 ENCOUNTER — Ambulatory Visit: Payer: Medicare Other | Admitting: Adult Health
